# Patient Record
Sex: Female | Born: 1960 | Race: Black or African American | Hispanic: No | Marital: Single | Smoking: Current every day smoker
Health system: Southern US, Community
[De-identification: ages and names within clinical notes are randomized; demographics above are authoritative.]

## PROBLEM LIST (undated history)

## (undated) DIAGNOSIS — E119 Type 2 diabetes mellitus without complications: Secondary | ICD-10-CM

## (undated) DIAGNOSIS — K759 Inflammatory liver disease, unspecified: Secondary | ICD-10-CM

## (undated) DIAGNOSIS — Z9289 Personal history of other medical treatment: Secondary | ICD-10-CM

## (undated) DIAGNOSIS — F329 Major depressive disorder, single episode, unspecified: Secondary | ICD-10-CM

## (undated) DIAGNOSIS — F1010 Alcohol abuse, uncomplicated: Secondary | ICD-10-CM

## (undated) DIAGNOSIS — I509 Heart failure, unspecified: Secondary | ICD-10-CM

## (undated) DIAGNOSIS — I639 Cerebral infarction, unspecified: Secondary | ICD-10-CM

## (undated) DIAGNOSIS — F419 Anxiety disorder, unspecified: Secondary | ICD-10-CM

## (undated) DIAGNOSIS — G35 Multiple sclerosis: Secondary | ICD-10-CM

## (undated) DIAGNOSIS — Z8742 Personal history of other diseases of the female genital tract: Secondary | ICD-10-CM

## (undated) DIAGNOSIS — A630 Anogenital (venereal) warts: Secondary | ICD-10-CM

## (undated) DIAGNOSIS — F319 Bipolar disorder, unspecified: Secondary | ICD-10-CM

## (undated) DIAGNOSIS — F0150 Vascular dementia without behavioral disturbance: Secondary | ICD-10-CM

## (undated) DIAGNOSIS — I10 Essential (primary) hypertension: Secondary | ICD-10-CM

## (undated) DIAGNOSIS — E876 Hypokalemia: Secondary | ICD-10-CM

## (undated) DIAGNOSIS — K635 Polyp of colon: Secondary | ICD-10-CM

## (undated) DIAGNOSIS — M6282 Rhabdomyolysis: Secondary | ICD-10-CM

## (undated) HISTORY — DX: Alcohol abuse, uncomplicated: F10.10

## (undated) HISTORY — DX: Polyp of colon: K63.5

## (undated) HISTORY — DX: Inflammatory liver disease, unspecified: K75.9

## (undated) HISTORY — DX: Anogenital (venereal) warts: A63.0

## (undated) HISTORY — DX: Personal history of other diseases of the female genital tract: Z87.42

## (undated) HISTORY — PX: OTHER SURGICAL HISTORY: SHX169

---

## 1995-06-06 DIAGNOSIS — Z8742 Personal history of other diseases of the female genital tract: Secondary | ICD-10-CM

## 1995-06-06 DIAGNOSIS — K635 Polyp of colon: Secondary | ICD-10-CM

## 1995-06-06 HISTORY — DX: Personal history of other diseases of the female genital tract: Z87.42

## 1995-06-06 HISTORY — DX: Polyp of colon: K63.5

## 1997-11-25 ENCOUNTER — Encounter: Admission: RE | Admit: 1997-11-25 | Discharge: 1997-11-25 | Payer: Self-pay | Admitting: Family Medicine

## 1997-12-04 ENCOUNTER — Encounter: Admission: RE | Admit: 1997-12-04 | Discharge: 1997-12-04 | Payer: Self-pay | Admitting: Family Medicine

## 1997-12-14 ENCOUNTER — Encounter: Admission: RE | Admit: 1997-12-14 | Discharge: 1997-12-14 | Payer: Self-pay | Admitting: Family Medicine

## 1998-11-26 ENCOUNTER — Encounter: Admission: RE | Admit: 1998-11-26 | Discharge: 1998-11-26 | Payer: Self-pay | Admitting: Family Medicine

## 1998-12-08 ENCOUNTER — Ambulatory Visit (HOSPITAL_COMMUNITY): Admission: RE | Admit: 1998-12-08 | Discharge: 1998-12-08 | Payer: Self-pay

## 2001-02-03 ENCOUNTER — Encounter (INDEPENDENT_AMBULATORY_CARE_PROVIDER_SITE_OTHER): Payer: Self-pay | Admitting: *Deleted

## 2001-02-03 LAB — CONVERTED CEMR LAB

## 2001-02-26 ENCOUNTER — Encounter: Admission: RE | Admit: 2001-02-26 | Discharge: 2001-02-26 | Payer: Self-pay | Admitting: Sports Medicine

## 2001-02-26 ENCOUNTER — Other Ambulatory Visit: Admission: RE | Admit: 2001-02-26 | Discharge: 2001-02-26 | Payer: Self-pay | Admitting: Sports Medicine

## 2001-04-25 ENCOUNTER — Ambulatory Visit (HOSPITAL_COMMUNITY): Admission: RE | Admit: 2001-04-25 | Discharge: 2001-04-25 | Payer: Self-pay | Admitting: Internal Medicine

## 2001-04-25 ENCOUNTER — Encounter: Payer: Self-pay | Admitting: Internal Medicine

## 2001-05-09 ENCOUNTER — Emergency Department (HOSPITAL_COMMUNITY): Admission: EM | Admit: 2001-05-09 | Discharge: 2001-05-09 | Payer: Self-pay | Admitting: Emergency Medicine

## 2001-05-09 ENCOUNTER — Encounter: Payer: Self-pay | Admitting: Emergency Medicine

## 2001-05-13 ENCOUNTER — Ambulatory Visit (HOSPITAL_COMMUNITY): Admission: RE | Admit: 2001-05-13 | Discharge: 2001-05-13 | Payer: Self-pay | Admitting: Neurology

## 2001-05-13 ENCOUNTER — Encounter: Payer: Self-pay | Admitting: Neurology

## 2001-06-25 ENCOUNTER — Ambulatory Visit (HOSPITAL_COMMUNITY): Admission: RE | Admit: 2001-06-25 | Discharge: 2001-06-25 | Payer: Self-pay | Admitting: Neurology

## 2002-03-10 ENCOUNTER — Encounter: Payer: Self-pay | Admitting: Emergency Medicine

## 2002-03-10 ENCOUNTER — Emergency Department (HOSPITAL_COMMUNITY): Admission: EM | Admit: 2002-03-10 | Discharge: 2002-03-10 | Payer: Self-pay | Admitting: Emergency Medicine

## 2002-07-07 ENCOUNTER — Encounter: Payer: Self-pay | Admitting: Internal Medicine

## 2002-07-07 ENCOUNTER — Ambulatory Visit (HOSPITAL_COMMUNITY): Admission: RE | Admit: 2002-07-07 | Discharge: 2002-07-07 | Payer: Self-pay | Admitting: Internal Medicine

## 2002-12-05 ENCOUNTER — Encounter (HOSPITAL_COMMUNITY): Admission: RE | Admit: 2002-12-05 | Discharge: 2003-01-04 | Payer: Self-pay | Admitting: Neurology

## 2002-12-06 ENCOUNTER — Inpatient Hospital Stay (HOSPITAL_COMMUNITY): Admission: EM | Admit: 2002-12-06 | Discharge: 2002-12-10 | Payer: Self-pay | Admitting: Emergency Medicine

## 2002-12-09 ENCOUNTER — Encounter: Payer: Self-pay | Admitting: Neurology

## 2003-01-05 ENCOUNTER — Encounter (HOSPITAL_COMMUNITY): Admission: RE | Admit: 2003-01-05 | Discharge: 2003-02-05 | Payer: Self-pay | Admitting: Internal Medicine

## 2003-01-21 ENCOUNTER — Encounter: Payer: Self-pay | Admitting: Neurology

## 2003-01-21 ENCOUNTER — Ambulatory Visit (HOSPITAL_COMMUNITY): Admission: RE | Admit: 2003-01-21 | Discharge: 2003-01-21 | Payer: Self-pay | Admitting: Neurology

## 2003-07-29 ENCOUNTER — Ambulatory Visit (HOSPITAL_COMMUNITY): Admission: RE | Admit: 2003-07-29 | Discharge: 2003-07-29 | Payer: Self-pay | Admitting: Internal Medicine

## 2004-07-12 ENCOUNTER — Ambulatory Visit (HOSPITAL_COMMUNITY): Admission: RE | Admit: 2004-07-12 | Discharge: 2004-07-12 | Payer: Self-pay | Admitting: Neurology

## 2004-07-29 ENCOUNTER — Ambulatory Visit (HOSPITAL_COMMUNITY): Admission: RE | Admit: 2004-07-29 | Discharge: 2004-07-29 | Payer: Self-pay | Admitting: Internal Medicine

## 2004-10-05 ENCOUNTER — Ambulatory Visit (HOSPITAL_COMMUNITY): Payer: Self-pay | Admitting: Neurology

## 2004-10-05 ENCOUNTER — Encounter (HOSPITAL_COMMUNITY): Admission: RE | Admit: 2004-10-05 | Discharge: 2004-11-04 | Payer: Self-pay | Admitting: Neurology

## 2004-12-17 ENCOUNTER — Emergency Department (HOSPITAL_COMMUNITY): Admission: EM | Admit: 2004-12-17 | Discharge: 2004-12-17 | Payer: Self-pay | Admitting: *Deleted

## 2004-12-22 ENCOUNTER — Ambulatory Visit: Payer: Self-pay | Admitting: Orthopedic Surgery

## 2005-01-05 ENCOUNTER — Emergency Department (HOSPITAL_COMMUNITY): Admission: EM | Admit: 2005-01-05 | Discharge: 2005-01-05 | Payer: Self-pay | Admitting: Emergency Medicine

## 2005-01-05 ENCOUNTER — Ambulatory Visit (HOSPITAL_COMMUNITY): Payer: Self-pay | Admitting: Neurology

## 2005-01-05 ENCOUNTER — Encounter (HOSPITAL_COMMUNITY): Admission: RE | Admit: 2005-01-05 | Discharge: 2005-02-04 | Payer: Self-pay | Admitting: Neurology

## 2005-03-16 ENCOUNTER — Encounter (HOSPITAL_COMMUNITY): Admission: RE | Admit: 2005-03-16 | Discharge: 2005-04-15 | Payer: Self-pay | Admitting: Neurology

## 2005-03-16 ENCOUNTER — Ambulatory Visit (HOSPITAL_COMMUNITY): Payer: Self-pay | Admitting: Neurology

## 2005-05-02 ENCOUNTER — Ambulatory Visit (HOSPITAL_COMMUNITY): Admission: RE | Admit: 2005-05-02 | Discharge: 2005-05-02 | Payer: Self-pay | Admitting: Internal Medicine

## 2005-05-04 ENCOUNTER — Emergency Department (HOSPITAL_COMMUNITY): Admission: EM | Admit: 2005-05-04 | Discharge: 2005-05-04 | Payer: Self-pay | Admitting: Emergency Medicine

## 2005-06-28 ENCOUNTER — Emergency Department (HOSPITAL_COMMUNITY): Admission: EM | Admit: 2005-06-28 | Discharge: 2005-06-28 | Payer: Self-pay | Admitting: Emergency Medicine

## 2005-12-07 ENCOUNTER — Ambulatory Visit (HOSPITAL_COMMUNITY): Admission: RE | Admit: 2005-12-07 | Discharge: 2005-12-07 | Payer: Self-pay | Admitting: Internal Medicine

## 2006-05-18 ENCOUNTER — Encounter: Payer: Self-pay | Admitting: Emergency Medicine

## 2006-05-18 ENCOUNTER — Inpatient Hospital Stay (HOSPITAL_COMMUNITY): Admission: AD | Admit: 2006-05-18 | Discharge: 2006-05-28 | Payer: Self-pay | Admitting: Neurosurgery

## 2006-05-22 ENCOUNTER — Ambulatory Visit: Payer: Self-pay | Admitting: Physical Medicine & Rehabilitation

## 2006-07-03 ENCOUNTER — Encounter: Admission: RE | Admit: 2006-07-03 | Discharge: 2006-07-03 | Payer: Self-pay | Admitting: Neurosurgery

## 2006-07-31 ENCOUNTER — Encounter: Admission: RE | Admit: 2006-07-31 | Discharge: 2006-07-31 | Payer: Self-pay | Admitting: Neurosurgery

## 2006-08-03 ENCOUNTER — Encounter (INDEPENDENT_AMBULATORY_CARE_PROVIDER_SITE_OTHER): Payer: Self-pay | Admitting: *Deleted

## 2006-08-23 ENCOUNTER — Encounter: Admission: RE | Admit: 2006-08-23 | Discharge: 2006-08-23 | Payer: Self-pay | Admitting: Neurosurgery

## 2006-09-13 ENCOUNTER — Encounter: Admission: RE | Admit: 2006-09-13 | Discharge: 2006-09-13 | Payer: Self-pay | Admitting: Neurosurgery

## 2006-10-10 ENCOUNTER — Inpatient Hospital Stay (HOSPITAL_COMMUNITY): Admission: RE | Admit: 2006-10-10 | Discharge: 2006-10-15 | Payer: Self-pay | Admitting: Neurosurgery

## 2006-10-16 ENCOUNTER — Emergency Department (HOSPITAL_COMMUNITY): Admission: EM | Admit: 2006-10-16 | Discharge: 2006-10-16 | Payer: Self-pay | Admitting: Emergency Medicine

## 2006-10-16 ENCOUNTER — Emergency Department (HOSPITAL_COMMUNITY): Admission: EM | Admit: 2006-10-16 | Discharge: 2006-10-17 | Payer: Self-pay | Admitting: Emergency Medicine

## 2006-10-24 ENCOUNTER — Emergency Department (HOSPITAL_COMMUNITY): Admission: EM | Admit: 2006-10-24 | Discharge: 2006-10-24 | Payer: Self-pay | Admitting: Emergency Medicine

## 2006-11-27 ENCOUNTER — Encounter: Admission: RE | Admit: 2006-11-27 | Discharge: 2006-11-27 | Payer: Self-pay | Admitting: Neurosurgery

## 2007-01-01 ENCOUNTER — Encounter: Admission: RE | Admit: 2007-01-01 | Discharge: 2007-01-01 | Payer: Self-pay | Admitting: Neurosurgery

## 2007-01-08 ENCOUNTER — Ambulatory Visit (HOSPITAL_COMMUNITY): Admission: RE | Admit: 2007-01-08 | Discharge: 2007-01-08 | Payer: Self-pay | Admitting: Obstetrics and Gynecology

## 2008-01-24 ENCOUNTER — Emergency Department (HOSPITAL_COMMUNITY): Admission: EM | Admit: 2008-01-24 | Discharge: 2008-01-24 | Payer: Self-pay | Admitting: Emergency Medicine

## 2008-03-01 ENCOUNTER — Emergency Department (HOSPITAL_COMMUNITY): Admission: EM | Admit: 2008-03-01 | Discharge: 2008-03-01 | Payer: Self-pay | Admitting: Emergency Medicine

## 2009-05-11 ENCOUNTER — Ambulatory Visit (HOSPITAL_COMMUNITY): Admission: RE | Admit: 2009-05-11 | Discharge: 2009-05-11 | Payer: Self-pay | Admitting: Internal Medicine

## 2009-06-02 ENCOUNTER — Other Ambulatory Visit: Admission: RE | Admit: 2009-06-02 | Discharge: 2009-06-02 | Payer: Self-pay | Admitting: Obstetrics and Gynecology

## 2009-10-01 ENCOUNTER — Emergency Department (HOSPITAL_COMMUNITY): Admission: EM | Admit: 2009-10-01 | Discharge: 2009-10-01 | Payer: Self-pay | Admitting: Emergency Medicine

## 2010-08-16 ENCOUNTER — Emergency Department (HOSPITAL_COMMUNITY): Payer: Medicare Other

## 2010-08-16 ENCOUNTER — Emergency Department (HOSPITAL_COMMUNITY)
Admission: EM | Admit: 2010-08-16 | Discharge: 2010-08-16 | Disposition: A | Discharge status: Home / Self Care | Payer: Medicare Other | Attending: Emergency Medicine | Admitting: Emergency Medicine

## 2010-08-16 DIAGNOSIS — M25569 Pain in unspecified knee: Secondary | ICD-10-CM | POA: Insufficient documentation

## 2010-08-16 DIAGNOSIS — M1990 Unspecified osteoarthritis, unspecified site: Secondary | ICD-10-CM | POA: Insufficient documentation

## 2010-08-16 DIAGNOSIS — G35 Multiple sclerosis: Secondary | ICD-10-CM | POA: Insufficient documentation

## 2010-08-16 DIAGNOSIS — E7800 Pure hypercholesterolemia, unspecified: Secondary | ICD-10-CM | POA: Insufficient documentation

## 2010-08-23 LAB — DIFFERENTIAL
Eosinophils Absolute: 0.2 10*3/uL (ref 0.0–0.7)
Eosinophils Relative: 2 % (ref 0–5)
Lymphs Abs: 3.5 10*3/uL (ref 0.7–4.0)
Monocytes Relative: 8 % (ref 3–12)

## 2010-08-23 LAB — CBC
HCT: 37.8 % (ref 36.0–46.0)
MCV: 83.3 fL (ref 78.0–100.0)
RBC: 4.55 MIL/uL (ref 3.87–5.11)
WBC: 6.9 10*3/uL (ref 4.0–10.5)

## 2010-08-23 LAB — BASIC METABOLIC PANEL
Chloride: 104 mEq/L (ref 96–112)
GFR calc Af Amer: >60 mL/min (ref >60)
Potassium: 3.4 mEq/L — ABNORMAL LOW (ref 3.5–5.1)

## 2010-08-23 LAB — POCT CARDIAC MARKERS: Myoglobin, poc: 47.7 ng/mL (ref 12–200)

## 2010-09-19 ENCOUNTER — Encounter: Payer: Self-pay | Admitting: Orthopedic Surgery

## 2010-09-23 ENCOUNTER — Encounter: Payer: Self-pay | Admitting: Orthopedic Surgery

## 2010-09-23 ENCOUNTER — Encounter: Payer: Self-pay | Admitting: Family Medicine

## 2010-10-05 ENCOUNTER — Encounter: Payer: Self-pay | Admitting: Orthopedic Surgery

## 2010-10-05 ENCOUNTER — Ambulatory Visit: Payer: Medicare Other | Admitting: Orthopedic Surgery

## 2010-10-18 NOTE — Op Note (Signed)
Tami Nolan, Tami Nolan NO.:  [id number]   MEDICAL RECORD NO.:  [id number]          PATIENT TYPE:  INP   LOCATION:  3015                         FACILITY:  [facility]   PHYSICIAN:  Donalee Citrin, M.D.        DATE OF BIRTH:  02/29/1961   DATE OF PROCEDURE:  10/10/2006  DATE OF DISCHARGE:                               OPERATIVE REPORT   PREOPERATIVE DIAGNOSIS:  Failure of fusion and hardware of an L2 burst  fracture, traumatic.   POSTOPERATIVE DIAGNOSIS:  Failure of fusion and hardware of an L2 burst  fracture, traumatic.   PROCEDURE:  Revision of fusion of L2 burst fracture, removal and  repositioning of L1 pedicle screws, extension up to T11 with placement  of thoracic pedicle screws at T11-T12, retaining the L2 pedicle screw,  placement of bilateral L3 pedicle screws, for a segmental fixation from  T11 to L3, reduction of spinal deformity T11 to L3, posterolateral  arthrodesis T11 to L3 using Mastergraft Matrix graft extender, BMP  recombinant bone morphogenic protein, and Actifuse.   SURGEON:  Donalee Citrin, M.D.   ASSISTANT:  Tia Alert, M.D.   ANESTHESIA:  General endotracheal anesthesia.   HISTORY OF PRESENT ILLNESS:  The patient is a 50 year old female who  presented almost two months ago with an L2 burst fracture.  Postoperatively, the patient was noncompliant in her brace and over the  last several weeks, has had progressive kyphosis with displacement of  the L1 pedicle screws into the T12-L1 disc space and failure of fusion.  The patient, due to displacement of the screws and progressive kyphosis,  was recommended revision of hardware.  The risks and benefits were  explained to the patient who understood and agreed to proceed.   DESCRIPTION OF PROCEDURE:  The patient was brought to the OR and was  induced under general anesthesia, positioned prone on the Wilson frame,  the back was prepped and draped in the usual sterile fashion.  Her old  incision was  opened up and extended cephalocaudal, the scar tissue was  dissected free and subperiosteal dissection was carried out over the  lamina of T10, T11, T12, L2, and L3. After exposure of the TPs  bilaterally at all these levels,  attention was first taken to removal  of the rods and screws at L1. So, the top end was tightened down of the  screws at L1 and L3, the cross-link was removed, the rods were removed,  the L1 pedicle screws were removed, the L3 screws were noted to be in  good position and still solid.  So, first, L3 screws were placed using  fluoroscopy and external bony landmarks.  A pilot hole was drilled in  the inferior aspect of the facet complex at the level of the PTP  confirmed with fluoroscopy, cannulated with the awl, probed from within  the pedicle, tapped with a 5.5 tap, probed from within the pedicle, a 6  by 45 screw inserted at L3 on the left.  This procedure was repeated  with a 6 by 45 screw inserted at L3 on  the right. After the lumbar  screws had been inserted, attention was taken to placing the thoracic  screws.  First, with lateral fluoroscopy, pilot holes were drilled with  a high speed drill at T11 and T12 bilaterally.  Then, with changing the  fluoro to AP, the position within the lateral border of the pedicle was  confirmed, and then went back to the lateral and the T11 and T12 screws  were placed on the right side, subsequently on the left side.  All  screws had excellent purchase.  They were probed at each step along the  way, both from within the pedicle and then fluoroscopy confirming depth  and trajectory.  5.5 by 40 screws were inserted at T11 and T12. The L1  screw holes were inspected.  It was felt there was enough of the L1  vertebral body competency left with redirection of the screws to place  screws and it was felt that another point of fixation would benefit the  patient.  So, using a slightly superior entry point in the L1 pedicle  screw with a  more inferior trajectory, new holes were drilled, tapped,  probed, and 6.5 by 45 screws inserted at L1. All screws had excellent  purchase.  Fluoroscopy confirmed good position.  The wound was copiously  irrigated and meticulous hemostasis was maintained.  Aggressive  decortication was carried in the TPs and lateral gutters from T11 down  to L3. The Mastergraft matrix graft extender mixed with BMP and Actifuse  and the patient's decorticated lateral masses was then laid down. After  all the graft was laid down, rods were cut, fashioned and placed.  All  screws were tightened down in situ and counter torqued.  Then, a cross-  link was inserted over the previous cross-link site.  Then two medium  Hemovac drains were placed.  Postop fluoroscopy confirmed good position  of the screws and rods.  Then, the wound was closed in layers with  interrupted Vicryl and the skin was closed with running 4-0  subcuticular.  The patient went to the recovery room in stable  condition.  At the end of the case, counts were correct.           ______________________________  Donalee Citrin, M.D.     GC/MEDQ  D:  10/10/2006  T:  10/10/2006  Job:  [id number]

## 2010-10-18 NOTE — Discharge Summary (Signed)
Tami Nolan, Tami Nolan                ACCOUNT NO.:  [id number]   MEDICAL RECORD NO.:  [id number]          PATIENT TYPE:  INP   LOCATION:  3015                         FACILITY:  [facility]   PHYSICIAN:  Donalee Citrin, M.D.        DATE OF BIRTH:  Oct 30, 1960   DATE OF ADMISSION:  10/10/2006  DATE OF DISCHARGE:  10/15/2006                               DISCHARGE SUMMARY   ADMISSION DIAGNOSIS:  Thoracic burst fracture failure of fusion from L1  to L3.   PROCEDURE DURING HOSPITALIZATION:  Revision of fusion from T11 to L4.   HOSPITAL COURSE:  The patient is a very pleasant, 50 year old female who  was admitted and went to the operating room where I performed the above  procedure.  Postoperatively, the patient did very well.  She went to the  recovery room and then the floor.  On the floor, the patient was having  a lot of postoperative back pain but no new leg symptoms.  Her lumbar  brace was obtained and she was progressively mobilized with physical and  occupational therapy on the first and second postoperative days.   Over the next couple of days, her drain was able to be taken out.  She  was mobilized very well.  She was fairly noncompliant with the brace and  the use of this was stressed with her on multiple occasions.  However,  by hospital day 5 the patient was able to be discharged home.  She was  discharged with home health physical therapy and home health nursing,  and told to wear the brace at all times whenever she is out of bed.  Both physicians and nursing felt the patient to be competent to be  discharged home and take care of herself with the resources that had  been arranged.           ______________________________  Donalee Citrin, M.D.     GC/MEDQ  D:  11/19/2006  T:  11/19/2006  Job:  [id number]

## 2010-10-21 NOTE — Procedures (Signed)
[name], Tami Nolan                ACCOUNT NO.:  [id number]   MEDICAL RECORD NO.:  [id number]          PATIENT TYPE:  OUT   LOCATION:  RAD                           FACILITY:  [facility]   PHYSICIAN:  Richard A. Alanda Amass, M.D.DATE OF BIRTH:  1960/11/05   DATE OF PROCEDURE:  DATE OF DISCHARGE:                                  ECHOCARDIOGRAM   This 50 year old woman has a history of multiple sclerosis and possible  heart failure.  A 2D echo was done to evaluate LV function.   1.  The aorta is normal at 2.8-cm.  2.  The aortic valve has three leaflets and opens normally.  There is no      aortic stenosis and no significant AI.  There is mild aortic sclerosis.  3.  The left atrium is normal at 3.9-cm.  The patient was in a sinus rhythm      during the study and there were no clots seen.  4.  IVS and LVPW was slightly was slightly asymmetrically thickened to 1.4      and 1.2-cm respectively.  There is normal contraction pattern of the IVS      and LVPW.  There is no outflow track gradient present.  5.  Left ventricular internal dimensions are W1L.  LVIDD equal 4.0-cm.      LVISD equal 2.5-cm.  There is normal thickening of all visualized      segments and no segmental wall motion abnormalities.  Estimated EF is      approximately 60%.  Doppler LV inflow signal is normal with no evidence      of diastolic relaxation abnormality or diastolic dysfunction.  6.  Mitral valve is mildly thickened.  There is no mitral valve prolapse.      There is trace to mild mitral regurgitation present.  There is minimal      mitral anular calcification.  7.  There is trace tricuspid regurgitation.  8.  The right ventricle is normal.  9.  There is no pericardial effusion.   A 2D echo shows:  1.  Normal systolic function.  2.  No evidence of diastolic relaxation abnormality of the left ventricle.  3.  There are normal internal chamber dimensions and no significant valvular      disease present.  4.  There is  very mild thickening of the mitral valve with no mitral valve      prolapse present.      RAW/MEDQ  D:  07/12/2004  T:  07/12/2004  Job:  [id number]   cc:   Darleen Crocker A. Gerilyn Pilgrim, M.D.  [address], [place]  [place]  [place] [address]  Fax: [phone]
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
## 2010-10-21 NOTE — Consult Note (Signed)
[name], [name] NO.:  [id number]   MEDICAL RECORD NO.:  [id number]          PATIENT TYPE:  INP   LOCATION:  3008                         FACILITY:  [facility]   PHYSICIAN:  Antonietta Breach, M.D.  DATE OF BIRTH:  1961/05/02   DATE OF CONSULTATION:  DATE OF DISCHARGE:  05/28/2006                                 CONSULTATION   DATE OF FOLLOWUP:  May 26, 2006   SUBJECTIVE:  Tami Nolan continues to have normal thought process.  She  has not had any hallucinations or delusions.  Her orientation has  remained intact and she is not had any combativeness.  She has no  psychotropic adverse effects.  Her appetite is intact and her interest  in TV and music are intact.  She has hope for the future.   EXAMINATION:  VITAL SIGNS:  Temperature 97.1, pulse 115, respiration 20,  blood pressure 113/78, O2 saturation on room air is 97%.   MENTAL STATUS EXAM:  [name] is alert.  She is oriented to all  spheres.  Her thought process is logical, coherent and goal-directed.  No looseness of associations.  Thought content:  No thoughts of harming  herself, no thoughts of harming others.  No delusions.  No  hallucinations.  Affect:  Broad and appropriate.  Concentration within  normal limits.  Judgment:  Intact.   ASSESSMENT:  1. 293.83 mood disorder not otherwise specified, stable.  2. 293.00 delirium, not otherwise specified, now in remission.  The      patient had a brief period of delirium.   RECOMMENDATIONS:  1. The patient is psychiatrically cleared for discharge. Would ask the      case manager to set the patient with her outpatient psychiatrist      within the first 10 days of discharge.  If the patient does not      have a outpatient psychiatrist, options include the clinic of [facility], also the clinics of [place] or       [facility].  Another option is the [facility].  2. 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  Gabapentin 1200 mg t.i.d. is mainly for neurologic      indications, but probably does have some mood stabilizing and      antianxiety benefits.  3. 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   [name] is motivated to continue her psychotropic regimen.  She agrees  to call the emergency services for any thoughts of harming herself,  thoughts of harming others or other psychiatric emergency symptoms.      Antonietta Breach, M.D.  Electronically Signed     JW/MEDQ  D:  05/27/2006  T:  05/28/2006  Job:  [id number]

## 2011-02-15 ENCOUNTER — Emergency Department (HOSPITAL_COMMUNITY): Payer: Medicare Other

## 2011-02-15 ENCOUNTER — Emergency Department (HOSPITAL_COMMUNITY)
Admission: EM | Admit: 2011-02-15 | Discharge: 2011-02-15 | Disposition: A | Discharge status: Home / Self Care | Payer: Medicare Other | Attending: Emergency Medicine | Admitting: Emergency Medicine

## 2011-02-15 ENCOUNTER — Encounter (HOSPITAL_COMMUNITY): Payer: Self-pay | Admitting: *Deleted

## 2011-02-15 DIAGNOSIS — Z860100 Personal history of colon polyps, unspecified: Secondary | ICD-10-CM | POA: Insufficient documentation

## 2011-02-15 DIAGNOSIS — F1010 Alcohol abuse, uncomplicated: Secondary | ICD-10-CM | POA: Insufficient documentation

## 2011-02-15 DIAGNOSIS — S40029A Contusion of unspecified upper arm, initial encounter: Secondary | ICD-10-CM

## 2011-02-15 DIAGNOSIS — Y92009 Unspecified place in unspecified non-institutional (private) residence as the place of occurrence of the external cause: Secondary | ICD-10-CM | POA: Insufficient documentation

## 2011-02-15 DIAGNOSIS — K759 Inflammatory liver disease, unspecified: Secondary | ICD-10-CM | POA: Insufficient documentation

## 2011-02-15 DIAGNOSIS — Z9851 Tubal ligation status: Secondary | ICD-10-CM | POA: Insufficient documentation

## 2011-02-15 DIAGNOSIS — M25519 Pain in unspecified shoulder: Secondary | ICD-10-CM | POA: Insufficient documentation

## 2011-02-15 DIAGNOSIS — F17200 Nicotine dependence, unspecified, uncomplicated: Secondary | ICD-10-CM | POA: Insufficient documentation

## 2011-02-15 DIAGNOSIS — G35 Multiple sclerosis: Secondary | ICD-10-CM | POA: Insufficient documentation

## 2011-02-15 DIAGNOSIS — IMO0002 Reserved for concepts with insufficient information to code with codable children: Secondary | ICD-10-CM | POA: Insufficient documentation

## 2011-02-15 HISTORY — DX: Multiple sclerosis: G35

## 2011-02-15 MED ORDER — ACETAMINOPHEN 500 MG PO TABS
1000.0000 mg | ORAL_TABLET | Freq: Once | ORAL | Status: AC
  Administered 2011-02-15: 1000 mg via ORAL
  Filled 2011-02-15: qty 2

## 2011-02-15 MED ORDER — TETANUS-DIPHTHERIA TOXOIDS TD 5-2 LFU IM INJ
0.5000 mL | INJECTION | Freq: Once | INTRAMUSCULAR | Status: AC
  Administered 2011-02-15: 0.5 mL via INTRAMUSCULAR
  Filled 2011-02-15: qty 0.5

## 2011-02-15 NOTE — ED Notes (Signed)
Pt c/o pain in her right arm. Pt states that she fell on Sunday and injured her arm. Also c/o pain in her right hand.
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
## 2011-04-04 ENCOUNTER — Other Ambulatory Visit: Payer: Self-pay

## 2011-04-04 ENCOUNTER — Emergency Department (HOSPITAL_COMMUNITY): Payer: Medicare Other

## 2011-04-04 ENCOUNTER — Emergency Department (HOSPITAL_COMMUNITY)
Admission: EM | Admit: 2011-04-04 | Discharge: 2011-04-05 | Disposition: A | Discharge status: Home / Self Care | Payer: Medicare Other | Attending: Emergency Medicine | Admitting: Emergency Medicine

## 2011-04-04 ENCOUNTER — Encounter (HOSPITAL_COMMUNITY): Payer: Self-pay | Admitting: *Deleted

## 2011-04-04 DIAGNOSIS — G35 Multiple sclerosis: Secondary | ICD-10-CM | POA: Insufficient documentation

## 2011-04-04 DIAGNOSIS — F17200 Nicotine dependence, unspecified, uncomplicated: Secondary | ICD-10-CM | POA: Insufficient documentation

## 2011-04-04 DIAGNOSIS — Z860100 Personal history of colon polyps, unspecified: Secondary | ICD-10-CM | POA: Insufficient documentation

## 2011-04-04 DIAGNOSIS — R079 Chest pain, unspecified: Secondary | ICD-10-CM | POA: Insufficient documentation

## 2011-04-04 DIAGNOSIS — K7010 Alcoholic hepatitis without ascites: Secondary | ICD-10-CM | POA: Insufficient documentation

## 2011-04-04 DIAGNOSIS — F1010 Alcohol abuse, uncomplicated: Secondary | ICD-10-CM | POA: Insufficient documentation

## 2011-04-04 LAB — CBC
HCT: 37.9 % (ref 36.0–46.0)
MCV: 79.6 fL (ref 78.0–100.0)
Platelets: 276 10*3/uL (ref 150–400)
RBC: 4.76 MIL/uL (ref 3.87–5.11)
WBC: 8.1 10*3/uL (ref 4.0–10.5)

## 2011-04-04 LAB — DIFFERENTIAL
Eosinophils Relative: 3 % (ref 0–5)
Lymphocytes Relative: 45 % (ref 12–46)
Lymphs Abs: 3.6 10*3/uL (ref 0.7–4.0)
Neutro Abs: 3.4 10*3/uL (ref 1.7–7.7)

## 2011-04-04 LAB — POCT I-STAT TROPONIN I: Troponin i, poc: 0.01 ng/mL (ref 0.00–0.08)

## 2011-04-04 NOTE — ED Notes (Signed)
Per EMS - pt from home - pt c/o chest discomfort, intermittent, pt having difficulty describing the pain. 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 Denies shortness of breath, n/v.

## 2011-04-05 ENCOUNTER — Encounter (HOSPITAL_COMMUNITY): Payer: Self-pay | Admitting: *Deleted

## 2011-04-05 LAB — CARDIAC PANEL(CRET KIN+CKTOT+MB+TROPI)
CK, MB: 6 ng/mL — ABNORMAL HIGH (ref 0.3–4.0)
Relative Index: 3.4 — ABNORMAL HIGH (ref 0.0–2.5)
Troponin I: <0.3 ng/mL (ref <0.30)

## 2011-04-05 LAB — BASIC METABOLIC PANEL
CO2: 27 mEq/L (ref 19–32)
Calcium: 9.1 mg/dL (ref 8.4–10.5)
Chloride: 101 mEq/L (ref 96–112)
Glucose, Bld: 175 mg/dL — ABNORMAL HIGH (ref 70–99)
Sodium: 137 mEq/L (ref 135–145)

## 2011-04-05 MED ORDER — POTASSIUM CHLORIDE CRYS ER 20 MEQ PO TBCR
20.0000 meq | EXTENDED_RELEASE_TABLET | Freq: Once | ORAL | Status: AC
  Administered 2011-04-05: 20 meq via ORAL
  Filled 2011-04-05: qty 1

## 2011-04-05 MED ORDER — ASPIRIN 81 MG PO CHEW
324.0000 mg | CHEWABLE_TABLET | Freq: Once | ORAL | Status: AC
  Administered 2011-04-05: 324 mg via ORAL

## 2011-04-05 MED ORDER — ASPIRIN 81 MG PO CHEW
CHEWABLE_TABLET | ORAL | Status: AC
  Filled 2011-04-05: qty 4

## 2011-04-05 NOTE — ED Provider Notes (Signed)
History     CSN: [id number] Arrival date & time: 04/04/2011 11:23 PM   First MD Initiated Contact with Patient 04/04/11 2326      Chief Complaint  Patient presents with  . Chest Pain    (Consider location/radiation/quality/duration/timing/severity/associated sxs/prior treatment) Patient is a 50 y.o. female presenting with chest pain. The history is provided by the patient.  Chest Pain The chest pain began 1 - 2 hours ago. Duration of episode(s) is 30 minutes. Chest pain occurs intermittently. The chest pain is resolved. Associated with: not associated with cough. At its most intense, the pain is at 5/10. The pain is currently at 0/10. The severity of the pain is moderate. The quality of the pain is described as aching. The pain radiates to the upper back. Chest pain is worsened by exertion. Pertinent negatives for primary symptoms include no fever, no fatigue, no syncope, no cough, no wheezing, no palpitations and no abdominal pain. She tried nothing for the symptoms. Risk factors include alcohol intake.  Her past medical history is significant for arrhythmia.  Pertinent negatives for past medical history include no aneurysm, no pacemaker, no seizures and no sickle cell disease.  Pertinent negatives for family medical history include: family history of aortic dissection.  Procedure history is negative for cardiac catheterization.     Past Medical History  Diagnosis Date  . Hepatitis     ETOH related   . ETOH abuse   . Hx of abnormal cervical Pap smear 1997  . Colon polyps 1997  . Genital warts   . Multiple sclerosis     Past Surgical History  Procedure Date  . Bilatetral tubal ligation     Family History  Problem Relation Age of Onset  . Coronary artery disease Brother   . Diabetes Brother   . Hypertension Brother   . Hypertension Mother   . Diabetes Mother   . Hypertension Sister   . Alcohol abuse      family history     History  Substance Use Topics  . Smoking  status: Current Everyday Smoker -- 1.0 packs/day for 15 years    Types: Cigarettes  . Smokeless tobacco: Never Used  . Alcohol Use: No     Hx of ETOH abuse - dry since 1997.    OB History    Grav Para Term Preterm Abortions TAB SAB Ect Mult Living                  Review of Systems  Constitutional: Negative for fever and fatigue.  HENT: Negative for congestion, sinus pressure and ear discharge.   Eyes: Negative for discharge.  Respiratory: Negative for cough and wheezing.   Cardiovascular: Positive for chest pain. Negative for palpitations and syncope.  Gastrointestinal: Negative for abdominal pain and diarrhea.  Genitourinary: Negative for frequency and hematuria.  Musculoskeletal: Negative for back pain.  Skin: Negative for rash.  Neurological: Negative for seizures and headaches.  Hematological: Negative.   Psychiatric/Behavioral: Negative for hallucinations.    Allergies  Review of patient's allergies indicates no known allergies.  Home Medications   Current Outpatient Rx  Name Route Sig Dispense Refill  . GABAPENTIN 600 MG PO TABS Oral Take 600 mg by mouth 3 (three) times daily.      . IBUPROFEN 800 MG PO TABS Oral Take 800 mg by mouth every 8 (eight) hours as needed. Pain     . METHOCARBAMOL 750 MG PO TABS Oral Take 750 mg by mouth 2 (two) times  daily.      Marland Kitchen MONTELUKAST SODIUM 10 MG PO TABS Oral Take 10 mg by mouth at bedtime.      Marland Kitchen PRENATAL PLUS 27-1 MG PO TABS Oral Take 1 tablet by mouth daily.      Marland Kitchen PRESCRIPTION MEDICATION Injection Inject as directed every other day. Patient states that she uses some type of injection for her MS every other day. Medication is prescribed by Dr. Felecia Shelling.     Marland Kitchen SIMVASTATIN 40 MG PO TABS Oral Take 40 mg by mouth at bedtime.      . TOLTERODINE TARTRATE 4 MG PO CP24 Oral Take 4 mg by mouth daily.      . TRAZODONE HCL 50 MG PO TABS Oral Take 50 mg by mouth at bedtime.      . VENLAFAXINE HCL 150 MG PO CP24 Oral Take 150 mg by mouth  daily.      . VENLAFAXINE HCL 75 MG PO CP24 Oral Take 75 mg by mouth daily.        BP 113/77  Pulse 77  Temp(Src) 98.2 F (36.8 C) (Oral)  Resp 16  Ht 5\' 7"  (1.702 m)  SpO2 98%  Physical Exam  Constitutional: She is oriented to person, place, and time. She appears well-developed.  HENT:  Head: Normocephalic and atraumatic.  Eyes: Conjunctivae and EOM are normal. No scleral icterus.  Neck: Neck supple. No thyromegaly present.  Cardiovascular: Normal rate and regular rhythm.  Exam reveals no gallop and no friction rub.   No murmur heard. Pulmonary/Chest: No stridor. She has no wheezes. She has no rales. She exhibits no tenderness.  Abdominal: She exhibits no distension. There is no tenderness. There is no rebound.  Musculoskeletal: Normal range of motion. She exhibits no edema.  Lymphadenopathy:    She has no cervical adenopathy.  Neurological: She is oriented to person, place, and time. Coordination normal.  Skin: No rash noted. No erythema.  Psychiatric: She has a normal mood and affect. Her behavior is normal.    ED Course  Procedures (including critical care time)  Labs Reviewed  CBC - Abnormal; Notable for the following:    MCHC 36.1 (*)    All other components within normal limits  DIFFERENTIAL - Abnormal; Notable for the following:    Neutrophils Relative 42 (*)    All other components within normal limits  BASIC METABOLIC PANEL - Abnormal; Notable for the following:    Potassium 2.9 (*)    Glucose, Bld 175 (*)    Creatinine, Ser 0.47 (*)    All other components within normal limits  CARDIAC PANEL(CRET KIN+CKTOT+MB+TROPI) - Abnormal; Notable for the following:    CK, MB 6.0 (*)    Relative Index 3.4 (*)    All other components within normal limits  POCT I-STAT TROPONIN I  POCT I-STAT TROPONIN I  I-STAT TROPONIN I  I-STAT TROPONIN I   Dg Chest 2 View  04/04/2011  *RADIOLOGY REPORT*  Clinical Data: Chest pain  CHEST - 2 VIEW  Comparison: 10/16/2006   Findings: Negative for heart failure.  Lungs are clear without infiltrate or effusion.  No mass lesion.  Thoraco lumbar fusion for a chronic fracture of L2.  IMPRESSION: No acute cardiopulmonary disease.  Original Report Authenticated By: Camelia Phenes, M.D.     1. Chest pain     Results for orders placed during the hospital encounter of 04/04/11  CBC      Component Value Range   WBC 8.1  4.0 - 10.5 (K/uL)   RBC 4.76  3.87 - 5.11 (MIL/uL)   Hemoglobin 13.7  12.0 - 15.0 (g/dL)   HCT 16.1  09.6 - 04.5 (%)   MCV 79.6  78.0 - 100.0 (fL)   MCH 28.8  26.0 - 34.0 (pg)   MCHC 36.1 (*) 30.0 - 36.0 (g/dL)   RDW 40.9  81.1 - 91.4 (%)   Platelets 276  150 - 400 (K/uL)  DIFFERENTIAL      Component Value Range   Neutrophils Relative 42 (*) 43 - 77 (%)   Neutro Abs 3.4  1.7 - 7.7 (K/uL)   Lymphocytes Relative 45  12 - 46 (%)   Lymphs Abs 3.6  0.7 - 4.0 (K/uL)   Monocytes Relative 10  3 - 12 (%)   Monocytes Absolute 0.8  0.1 - 1.0 (K/uL)   Eosinophils Relative 3  0 - 5 (%)   Eosinophils Absolute 0.2  0.0 - 0.7 (K/uL)   Basophils Relative 1  0 - 1 (%)   Basophils Absolute 0.1  0.0 - 0.1 (K/uL)  BASIC METABOLIC PANEL      Component Value Range   Sodium 137  135 - 145 (mEq/L)   Potassium 2.9 (*) 3.5 - 5.1 (mEq/L)   Chloride 101  96 - 112 (mEq/L)   CO2 27  19 - 32 (mEq/L)   Glucose, Bld 175 (*) 70 - 99 (mg/dL)   BUN 10  6 - 23 (mg/dL)   Creatinine, Ser 7.82 (*) 0.50 - 1.10 (mg/dL)   Calcium 9.1  8.4 - 95.6 (mg/dL)   GFR calc non Af Amer >90  >90 (mL/min)   GFR calc Af Amer >90  >90 (mL/min)  CARDIAC PANEL(CRET KIN+CKTOT+MB+TROPI)      Component Value Range   Total CK 176  7 - 177 (U/L)   CK, MB 6.0 (*) 0.3 - 4.0 (ng/mL)   Troponin I <0.30  <0.30 (ng/mL)   Relative Index 3.4 (*) 0.0 - 2.5   POCT I-STAT TROPONIN I      Component Value Range   Troponin i, poc 0.01  0.00 - 0.08 (ng/mL)   Comment 3           POCT I-STAT TROPONIN I      Component Value Range   Troponin i, poc 0.00  0.00  - 0.08 (ng/mL)   Comment 3            Dg Chest 2 View  04/04/2011  *RADIOLOGY REPORT*  Clinical Data: Chest pain  CHEST - 2 VIEW  Comparison: 10/16/2006  Findings: Negative for heart failure.  Lungs are clear without infiltrate or effusion.  No mass lesion.  Thoraco lumbar fusion for a chronic fracture of L2.  IMPRESSION: No acute cardiopulmonary disease.  Original Report Authenticated By: Camelia Phenes, M.D.   \   Date: 04/05/2011  Rate: 81  Rhythm: normal sinus rhythm  QRS Axis: normal  Intervals: normal  ST/T Wave abnormalities: nonspecific ST changes  Conduction Disutrbances:none  Narrative Interpretation:   Old EKG Reviewed: unchanged    MDM  Chest pain,  Anxiety,  Cardiac.  Pt was told she should be admitted to rule out an mi,  Or to do further test to check her heart out.  I told her she may need a stress test or catherization.   She decided to see her md this week for possible further tests.  She was put on aspirin once a day  Benny Lennert, MD 04/05/11 [phone]

## 2011-04-05 NOTE — ED Notes (Signed)
D/c instructions reviewed w/ pt - pt denies any further questions or concerns at present.   

## 2011-07-19 ENCOUNTER — Other Ambulatory Visit (HOSPITAL_COMMUNITY)
Admission: RE | Admit: 2011-07-19 | Discharge: 2011-07-19 | Disposition: A | Discharge status: Home / Self Care | Payer: Medicare Other | Source: Ambulatory Visit | Attending: Obstetrics and Gynecology | Admitting: Obstetrics and Gynecology

## 2011-07-19 ENCOUNTER — Other Ambulatory Visit: Payer: Self-pay | Admitting: Obstetrics and Gynecology

## 2011-07-19 DIAGNOSIS — Z1212 Encounter for screening for malignant neoplasm of rectum: Secondary | ICD-10-CM | POA: Diagnosis not present

## 2011-07-19 DIAGNOSIS — Z124 Encounter for screening for malignant neoplasm of cervix: Secondary | ICD-10-CM | POA: Diagnosis not present

## 2011-07-19 DIAGNOSIS — R319 Hematuria, unspecified: Secondary | ICD-10-CM | POA: Diagnosis not present

## 2011-10-17 DIAGNOSIS — G35 Multiple sclerosis: Secondary | ICD-10-CM | POA: Diagnosis not present

## 2011-10-17 DIAGNOSIS — M25519 Pain in unspecified shoulder: Secondary | ICD-10-CM | POA: Diagnosis not present

## 2011-11-06 ENCOUNTER — Other Ambulatory Visit: Payer: Self-pay | Admitting: Obstetrics and Gynecology

## 2011-11-14 DIAGNOSIS — M1990 Unspecified osteoarthritis, unspecified site: Secondary | ICD-10-CM | POA: Diagnosis not present

## 2011-11-14 DIAGNOSIS — G35 Multiple sclerosis: Secondary | ICD-10-CM | POA: Diagnosis not present

## 2011-11-21 DIAGNOSIS — Z0000 Encounter for general adult medical examination without abnormal findings: Secondary | ICD-10-CM | POA: Diagnosis not present

## 2011-11-21 DIAGNOSIS — I10 Essential (primary) hypertension: Secondary | ICD-10-CM | POA: Diagnosis not present

## 2011-11-21 DIAGNOSIS — E7800 Pure hypercholesterolemia, unspecified: Secondary | ICD-10-CM | POA: Diagnosis not present

## 2012-02-13 DIAGNOSIS — G35 Multiple sclerosis: Secondary | ICD-10-CM | POA: Diagnosis not present

## 2012-02-13 DIAGNOSIS — E7800 Pure hypercholesterolemia, unspecified: Secondary | ICD-10-CM | POA: Diagnosis not present

## 2012-02-13 DIAGNOSIS — Z23 Encounter for immunization: Secondary | ICD-10-CM | POA: Diagnosis not present

## 2012-02-26 ENCOUNTER — Emergency Department (HOSPITAL_COMMUNITY): Payer: Medicare Other

## 2012-02-26 ENCOUNTER — Emergency Department (HOSPITAL_COMMUNITY)
Admission: EM | Admit: 2012-02-26 | Discharge: 2012-02-26 | Disposition: A | Discharge status: Home / Self Care | Payer: Medicare Other | Attending: Emergency Medicine | Admitting: Emergency Medicine

## 2012-02-26 ENCOUNTER — Encounter (HOSPITAL_COMMUNITY): Payer: Self-pay | Admitting: *Deleted

## 2012-02-26 DIAGNOSIS — S4980XA Other specified injuries of shoulder and upper arm, unspecified arm, initial encounter: Secondary | ICD-10-CM | POA: Diagnosis not present

## 2012-02-26 DIAGNOSIS — Y9229 Other specified public building as the place of occurrence of the external cause: Secondary | ICD-10-CM | POA: Insufficient documentation

## 2012-02-26 DIAGNOSIS — S99929A Unspecified injury of unspecified foot, initial encounter: Secondary | ICD-10-CM | POA: Insufficient documentation

## 2012-02-26 DIAGNOSIS — S8990XA Unspecified injury of unspecified lower leg, initial encounter: Secondary | ICD-10-CM | POA: Diagnosis not present

## 2012-02-26 DIAGNOSIS — S40019A Contusion of unspecified shoulder, initial encounter: Secondary | ICD-10-CM

## 2012-02-26 DIAGNOSIS — S46909A Unspecified injury of unspecified muscle, fascia and tendon at shoulder and upper arm level, unspecified arm, initial encounter: Secondary | ICD-10-CM | POA: Insufficient documentation

## 2012-02-26 DIAGNOSIS — W19XXXA Unspecified fall, initial encounter: Secondary | ICD-10-CM | POA: Insufficient documentation

## 2012-02-26 DIAGNOSIS — S8390XA Sprain of unspecified site of unspecified knee, initial encounter: Secondary | ICD-10-CM

## 2012-02-26 DIAGNOSIS — IMO0002 Reserved for concepts with insufficient information to code with codable children: Secondary | ICD-10-CM | POA: Diagnosis not present

## 2012-02-26 DIAGNOSIS — G35 Multiple sclerosis: Secondary | ICD-10-CM | POA: Diagnosis not present

## 2012-02-26 DIAGNOSIS — F17200 Nicotine dependence, unspecified, uncomplicated: Secondary | ICD-10-CM | POA: Insufficient documentation

## 2012-02-26 DIAGNOSIS — S93409A Sprain of unspecified ligament of unspecified ankle, initial encounter: Secondary | ICD-10-CM

## 2012-02-26 DIAGNOSIS — Y998 Other external cause status: Secondary | ICD-10-CM | POA: Insufficient documentation

## 2012-02-26 DIAGNOSIS — Y9301 Activity, walking, marching and hiking: Secondary | ICD-10-CM | POA: Insufficient documentation

## 2012-02-26 DIAGNOSIS — S99919A Unspecified injury of unspecified ankle, initial encounter: Secondary | ICD-10-CM | POA: Diagnosis not present

## 2012-02-26 DIAGNOSIS — M25469 Effusion, unspecified knee: Secondary | ICD-10-CM | POA: Diagnosis not present

## 2012-02-26 MED ORDER — NAPROXEN 250 MG PO TABS
500.0000 mg | ORAL_TABLET | Freq: Once | ORAL | Status: AC
  Administered 2012-02-26: 500 mg via ORAL
  Filled 2012-02-26: qty 2

## 2012-02-26 MED ORDER — NAPROXEN 500 MG PO TABS: 500.0000 mg | ORAL_TABLET | Freq: Two times a day (BID) | ORAL | Status: DC

## 2012-02-26 NOTE — ED Notes (Signed)
Fell in [employer], on Friday,  Pain rt foot, ankle and shoulder, No LOC

## 2012-02-26 NOTE — ED Provider Notes (Signed)
History     CSN: [id number]  Arrival date & time 02/26/12  1535   First MD Initiated Contact with Patient 02/26/12 1625      Chief Complaint  Patient presents with  . Fall    (Consider location/radiation/quality/duration/timing/severity/associated sxs/prior treatment) HPI Comments: Patient c/o pain to her right ankle, knee and shoulder after falling in a grocery store.  Pain is worse with standing or walking.  Improves with rest.  She denies headaches, head injury,  dizziness, vomiting, visual changes, neck pain or LOC  Patient is a 51 y.o. female presenting with fall. The history is provided by the patient.  Fall The accident occurred more than 2 days ago. The fall occurred while walking. Distance fallen: from standing position. She landed on a hard floor. There was no blood loss. The point of impact was the right shoulder and right knee (right ankle). The pain is present in the right shoulder and right knee (right ankle). The patient is experiencing no pain. She was ambulatory at the scene. There was no entrapment after the fall. There was no drug use involved in the accident. There was no alcohol use involved in the accident. Pertinent negatives include no visual change, no fever, no numbness, no abdominal pain, no bowel incontinence, no nausea, no vomiting, no headaches, no loss of consciousness and no tingling. The symptoms are aggravated by standing and ambulation. She has tried nothing for the symptoms. The treatment provided no relief.    Past Medical History  Diagnosis Date  . Hepatitis     ETOH related   . ETOH abuse   . Hx of abnormal cervical Pap smear 1997  . Colon polyps 1997  . Genital warts   . Multiple sclerosis     Past Surgical History  Procedure Date  . Bilatetral tubal ligation     Family History  Problem Relation Age of Onset  . Coronary artery disease Brother   . Diabetes Brother   . Hypertension Brother   . Hypertension Mother   . Diabetes Mother     . Hypertension Sister   . Alcohol abuse      family history     History  Substance Use Topics  . Smoking status: Current Every Day Smoker -- 1.0 packs/day for 15 years    Types: Cigarettes  . Smokeless tobacco: Never Used  . Alcohol Use: No     Hx of ETOH abuse - dry since 1997.    OB History    Grav Para Term Preterm Abortions TAB SAB Ect Mult Living                  Review of Systems  Constitutional: Negative for fever and chills.  HENT: Negative for neck pain.   Eyes: Negative for visual disturbance.  Cardiovascular: Negative for chest pain.  Gastrointestinal: Negative for nausea, vomiting, abdominal pain and bowel incontinence.  Genitourinary: Negative for dysuria and difficulty urinating.  Musculoskeletal: Positive for joint swelling and arthralgias. Negative for back pain.  Skin: Negative for color change and wound.  Neurological: Negative for dizziness, tingling, loss of consciousness, syncope, weakness, numbness and headaches.  All other systems reviewed and are negative.    Allergies  Review of patient's allergies indicates no known allergies.  Home Medications   Current Outpatient Rx  Name Route Sig Dispense Refill  . GABAPENTIN 600 MG PO TABS Oral Take 600 mg by mouth 3 (three) times daily.      . IBUPROFEN 800 MG PO  TABS Oral Take 800 mg by mouth every 8 (eight) hours as needed. Pain     . METHOCARBAMOL 750 MG PO TABS Oral Take 750 mg by mouth 2 (two) times daily.      Marland Kitchen MONTELUKAST SODIUM 10 MG PO TABS Oral Take 10 mg by mouth at bedtime.      Marland Kitchen PRENATAL PLUS 27-1 MG PO TABS Oral Take 1 tablet by mouth daily.      Marland Kitchen PRESCRIPTION MEDICATION Injection Inject as directed every other day. Patient states that she uses some type of injection for her MS every other day. Medication is prescribed by Dr. Felecia Shelling.     Marland Kitchen SIMVASTATIN 40 MG PO TABS Oral Take 40 mg by mouth at bedtime.      . TOLTERODINE TARTRATE ER 4 MG PO CP24 Oral Take 4 mg by mouth daily.      .  TRAZODONE HCL 50 MG PO TABS Oral Take 50 mg by mouth at bedtime.      . VENLAFAXINE HCL ER 150 MG PO CP24 Oral Take 150 mg by mouth daily.      . VENLAFAXINE HCL ER 75 MG PO CP24 Oral Take 75 mg by mouth daily.        BP 131/89  Pulse 89  Temp 98.1 F (36.7 C) (Oral)  Resp 18  Ht 5\' 7"  (1.702 m)  Wt 161 lb (73.029 kg)  BMI 25.22 kg/m2  SpO2 98%  Physical Exam  Nursing note and vitals reviewed. Constitutional: She is oriented to person, place, and time. She appears well-developed and well-nourished. No distress.  Cardiovascular: Normal rate, regular rhythm, normal heart sounds and intact distal pulses.   Pulmonary/Chest: Effort normal and breath sounds normal.  Musculoskeletal: She exhibits tenderness. She exhibits no edema.       Right shoulder: She exhibits tenderness, bony tenderness and pain. She exhibits normal range of motion, no swelling, no effusion, no crepitus, no deformity, no laceration, no spasm, normal pulse and normal strength.       Right knee: She exhibits normal range of motion, no swelling, no effusion, no ecchymosis, no deformity, no laceration and no erythema. tenderness found. Medial joint line and lateral joint line tenderness noted.       Right ankle: She exhibits normal range of motion, no swelling, no ecchymosis, no deformity, no laceration and normal pulse. tenderness. Lateral malleolus tenderness found. No posterior TFL, no head of 5th metatarsal and no proximal fibula tenderness found. Achilles tendon normal.       Arms:      Legs:      Feet:       ttp of the anterior right knee, lateral right ankle and anterior right shoulder.  No erythema, bruising or deformity.  Radial pulse and DP pulse are brisk, distal sensation to the fingers and foot are intact, CR< 3 sec.    Neurological: She is alert and oriented to person, place, and time. She exhibits normal muscle tone. Coordination normal.  Skin: Skin is warm and dry. No erythema.    ED Course  Procedures  (including critical care time)  Labs Reviewed - No data to display Dg Shoulder Right  02/26/2012  *RADIOLOGY REPORT*  Clinical Data: Larey Seat and injured right shoulder.  RIGHT SHOULDER - 2+ VIEW  Comparison: Right shoulder x-rays 02/15/2011.  Findings: No evidence of acute fracture or glenohumeral dislocation.  Acromioclavicular joint intact without significant degenerative changes.  Slight narrowing of the subacromial space, more so than on the  prior examination.  IMPRESSION: No acute osseous abnormality.  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   Original Report Authenticated By: Arnell Sieving, M.D.    Dg Ankle Complete Right  02/26/2012  *RADIOLOGY REPORT*  Clinical Data: Larey Seat and injured right ankle.  RIGHT ANKLE - COMPLETE 3+ VIEW  Comparison: None.  Findings: Cross-table lateral technique was utilized. No evidence of acute, subacute, or healed fractures.  Ankle mortise intact with well-preserved joint space.  No intrinsic osseous abnormalities. No evidence of a significant joint effusion.  Phleboliths in the subcutaneous tissues of the lower leg.  IMPRESSION: No osseous abnormality.   Original Report Authenticated By: Arnell Sieving, M.D.    Dg Knee Complete 4 Views Right  02/26/2012  *RADIOLOGY REPORT*  Clinical Data: Larey Seat and injured right knee.  RIGHT KNEE - COMPLETE 4+ VIEW  Comparison: Right knee x-rays 08/16/2010.  Findings: No evidence of acute fracture or dislocation.  Severe medial compartment joint space narrowing and associated hypertrophic changes.  Mild patellofemoral and lateral compartment joint space narrowing and associated hypertrophic changes. Phleboliths in the subcutaneous tissues anterior to the patella. Moderate sized joint effusion, similar to the prior examination.  IMPRESSION: No acute osseous abnormality.  Osteoarthritis, worst in the medial compartment.  Moderate sized joint effusion.   Original Report  Authenticated By: Arnell Sieving, M.D.      aso splint applied, pain improved, remains NV intact   MDM     Patient agrees to close follow-up with her PMD.  No focal neuro deficits, ambulates with a steady gait. Likely musculoskeletal injury.   Will also give referral for orthopedics  The patient appears reasonably screened and/or stabilized for discharge and I doubt any other medical condition or other [facility] requiring further screening, evaluation, or treatment in the ED at this time prior to discharge.    Prescribed: naprosyn  November Sypher L. [place], [place] 02/28/12 1831

## 2012-02-28 NOTE — ED Provider Notes (Signed)
Medical screening examination/treatment/procedure(s) were performed by non-physician practitioner and as supervising physician I was immediately available for consultation/collaboration.   Shelda Jakes, MD 02/28/12 [phone]

## 2012-03-11 DIAGNOSIS — M25519 Pain in unspecified shoulder: Secondary | ICD-10-CM | POA: Diagnosis not present

## 2012-03-27 ENCOUNTER — Emergency Department (HOSPITAL_COMMUNITY): Payer: Medicare Other

## 2012-03-27 ENCOUNTER — Emergency Department (HOSPITAL_COMMUNITY)
Admission: EM | Admit: 2012-03-27 | Discharge: 2012-03-27 | Disposition: A | Discharge status: Home / Self Care | Payer: Medicare Other | Attending: Emergency Medicine | Admitting: Emergency Medicine

## 2012-03-27 ENCOUNTER — Encounter (HOSPITAL_COMMUNITY): Payer: Self-pay

## 2012-03-27 DIAGNOSIS — R279 Unspecified lack of coordination: Secondary | ICD-10-CM | POA: Diagnosis not present

## 2012-03-27 DIAGNOSIS — F17200 Nicotine dependence, unspecified, uncomplicated: Secondary | ICD-10-CM | POA: Insufficient documentation

## 2012-03-27 DIAGNOSIS — S0990XA Unspecified injury of head, initial encounter: Secondary | ICD-10-CM | POA: Diagnosis not present

## 2012-03-27 DIAGNOSIS — Y92009 Unspecified place in unspecified non-institutional (private) residence as the place of occurrence of the external cause: Secondary | ICD-10-CM | POA: Insufficient documentation

## 2012-03-27 DIAGNOSIS — Y9301 Activity, walking, marching and hiking: Secondary | ICD-10-CM | POA: Insufficient documentation

## 2012-03-27 DIAGNOSIS — S4980XA Other specified injuries of shoulder and upper arm, unspecified arm, initial encounter: Secondary | ICD-10-CM | POA: Insufficient documentation

## 2012-03-27 DIAGNOSIS — F1011 Alcohol abuse, in remission: Secondary | ICD-10-CM | POA: Insufficient documentation

## 2012-03-27 DIAGNOSIS — S46909A Unspecified injury of unspecified muscle, fascia and tendon at shoulder and upper arm level, unspecified arm, initial encounter: Secondary | ICD-10-CM | POA: Insufficient documentation

## 2012-03-27 DIAGNOSIS — Z8719 Personal history of other diseases of the digestive system: Secondary | ICD-10-CM | POA: Insufficient documentation

## 2012-03-27 DIAGNOSIS — S298XXA Other specified injuries of thorax, initial encounter: Secondary | ICD-10-CM | POA: Diagnosis not present

## 2012-03-27 DIAGNOSIS — M25519 Pain in unspecified shoulder: Secondary | ICD-10-CM | POA: Diagnosis not present

## 2012-03-27 DIAGNOSIS — G35 Multiple sclerosis: Secondary | ICD-10-CM | POA: Insufficient documentation

## 2012-03-27 DIAGNOSIS — Z79899 Other long term (current) drug therapy: Secondary | ICD-10-CM | POA: Insufficient documentation

## 2012-03-27 DIAGNOSIS — W19XXXA Unspecified fall, initial encounter: Secondary | ICD-10-CM

## 2012-03-27 DIAGNOSIS — Z8742 Personal history of other diseases of the female genital tract: Secondary | ICD-10-CM | POA: Insufficient documentation

## 2012-03-27 DIAGNOSIS — Z598 Other problems related to housing and economic circumstances: Secondary | ICD-10-CM | POA: Insufficient documentation

## 2012-03-27 DIAGNOSIS — R296 Repeated falls: Secondary | ICD-10-CM | POA: Insufficient documentation

## 2012-03-27 LAB — COMPREHENSIVE METABOLIC PANEL
ALT: 13 U/L (ref 0–35)
AST: 19 U/L (ref 0–37)
Alkaline Phosphatase: 62 U/L (ref 39–117)
CO2: 30 mEq/L (ref 19–32)
Calcium: 9.6 mg/dL (ref 8.4–10.5)
GFR calc non Af Amer: >90 mL/min (ref >90)
Glucose, Bld: 105 mg/dL — ABNORMAL HIGH (ref 70–99)
Potassium: 3.6 mEq/L (ref 3.5–5.1)
Sodium: 139 mEq/L (ref 135–145)
Total Protein: 7.2 g/dL (ref 6.0–8.3)

## 2012-03-27 LAB — CBC WITH DIFFERENTIAL/PLATELET
Basophils Absolute: 0 10*3/uL (ref 0.0–0.1)
Basophils Relative: 1 % (ref 0–1)
Eosinophils Absolute: 0.1 10*3/uL (ref 0.0–0.7)
Eosinophils Relative: 1 % (ref 0–5)
HCT: 40.2 % (ref 36.0–46.0)
Lymphocytes Relative: 50 % — ABNORMAL HIGH (ref 12–46)
MCH: 28.9 pg (ref 26.0–34.0)
MCHC: 36.3 g/dL — ABNORMAL HIGH (ref 30.0–36.0)
MCV: 79.4 fL (ref 78.0–100.0)
Monocytes Absolute: 0.5 10*3/uL (ref 0.1–1.0)
Platelets: 341 10*3/uL (ref 150–400)
RDW: 14.4 % (ref 11.5–15.5)
WBC: 7.5 10*3/uL (ref 4.0–10.5)

## 2012-03-27 LAB — URINALYSIS, ROUTINE W REFLEX MICROSCOPIC
Bilirubin Urine: NEGATIVE
Ketones, ur: NEGATIVE mg/dL
Leukocytes, UA: NEGATIVE
Nitrite: NEGATIVE
Protein, ur: NEGATIVE mg/dL
Urobilinogen, UA: 0.2 mg/dL (ref 0.0–1.0)
pH: 8.5 — ABNORMAL HIGH (ref 5.0–8.0)

## 2012-03-27 NOTE — ED Provider Notes (Signed)
History     CSN: [id number]  Arrival date & time 03/27/12  1017   First MD Initiated Contact with Patient 03/27/12 1021      Chief Complaint  Patient presents with  . Fall    HPI Pt was seen at 1055.  Per EMS, family and pt report, c/o sudden onset and resolution of one episode of fall that occurred this morning PTA.  Pt states she was walking with her walker to the bathroom in her apt and fell down. Pt was unable to get up on her own and she was incont of urine.  Family came to home and found her on the floor.  Pt only c/o acute flair of her chronic vague right shoulder pain since last month. Denies syncope, no CP/SOB, no abd pain, no N/V/D, no back pain, no focal motor weakness.    Past Medical History  Diagnosis Date  . Hepatitis     ETOH related   . ETOH abuse   . Hx of abnormal cervical Pap smear 1997  . Colon polyps 1997  . Genital warts   . Multiple sclerosis     Past Surgical History  Procedure Date  . Bilatetral tubal ligation     Family History  Problem Relation Age of Onset  . Coronary artery disease Brother   . Diabetes Brother   . Hypertension Brother   . Hypertension Mother   . Diabetes Mother   . Hypertension Sister   . Alcohol abuse      family history     History  Substance Use Topics  . Smoking status: Current Every Day Smoker -- 1.0 packs/day for 15 years    Types: Cigarettes  . Smokeless tobacco: Never Used  . Alcohol Use: No     Hx of ETOH abuse - dry since 1997.    Review of Systems ROS: Statement: All systems negative except as marked or noted in the HPI; Constitutional: Negative for fever and chills. ; ; Eyes: Negative for eye pain, redness and discharge. ; ; ENMT: Negative for ear pain, hoarseness, nasal congestion, sinus pressure and sore throat. ; ; Cardiovascular: Negative for chest pain, palpitations, diaphoresis, dyspnea and peripheral edema. ; ; Respiratory: Negative for cough, wheezing and stridor. ; ; Gastrointestinal: Negative  for nausea, vomiting, diarrhea, abdominal pain, blood in stool, hematemesis, jaundice and rectal bleeding. . ; ; Genitourinary: Negative for dysuria, flank pain and hematuria. ; ; Musculoskeletal: +right shoulder pain. Negative for back pain and neck pain. Negative for swelling.; ; Skin: Negative for pruritus, rash, abrasions, blisters, bruising and skin lesion.; ; Neuro: Negative for headache, lightheadedness and neck stiffness. Negative for altered level of consciousness , altered mental status, extremity weakness, paresthesias, involuntary movement, seizure and syncope.       Allergies  Review of patient's allergies indicates no known allergies.  Home Medications   Current Outpatient Rx  Name Route Sig Dispense Refill  . CYCLOBENZAPRINE HCL 10 MG PO TABS Oral Take 10 mg by mouth 2 (two) times daily.    Marland Kitchen GABAPENTIN 600 MG PO TABS Oral Take 1,200 mg by mouth 3 (three) times daily.     Marland Kitchen HYDROCODONE-ACETAMINOPHEN 5-325 MG PO TABS Oral Take 1 tablet by mouth every 6 (six) hours as needed. Pain    . LORATADINE 10 MG PO TABS Oral Take 10 mg by mouth daily.    Marland Kitchen MONTELUKAST SODIUM 10 MG PO TABS Oral Take 10 mg by mouth at bedtime.      Marland Kitchen  ADULT MULTIVITAMIN W/MINERALS CH Oral Take 1 tablet by mouth daily.    Marland Kitchen SIMVASTATIN 40 MG PO TABS Oral Take 40 mg by mouth at bedtime.      . TRAZODONE HCL 300 MG PO TABS Oral Take 300 mg by mouth at bedtime.    . VENLAFAXINE HCL ER 150 MG PO CP24 Oral Take 150 mg by mouth daily.      . VENLAFAXINE HCL ER 75 MG PO CP24 Oral Take 75 mg by mouth daily.        BP 156/119  Pulse 82  Temp 98.1 F (36.7 C) (Oral)  Resp 14  Ht 5\' 7"  (1.702 m)  SpO2 98%  Physical Exam 1100: Physical examination:  Nursing notes reviewed; Vital signs and O2 SAT reviewed;  Constitutional: Well developed, Well nourished, Well hydrated, In no acute distress; Head:  Normocephalic, atraumatic; Eyes: EOMI, PERRL, No scleral icterus; ENMT: Mouth and pharynx normal, Mucous membranes  moist; Neck: Supple, Full range of motion, No lymphadenopathy; Cardiovascular: Regular rate and rhythm, No gallop; Respiratory: Breath sounds clear & equal bilaterally, No rales, rhonchi, wheezes.  Speaking full sentences with ease, Normal respiratory effort/excursion; Chest: Nontender, Movement normal; Abdomen: Soft, Nontender, Nondistended, Normal bowel sounds;; Extremities: Pulses normal, No tenderness, No edema, No deformity. No open wounds. No erythema.  No ecchymosis.  No calf edema or asymmetry.; Neuro: AA&Ox3, Major CN grossly intact.  Speech clear. No facial droop.  Lifts and moves all ext off stretcher against gravity without apparent gross focal motor deficits or drift.; Skin: Color normal, Warm, Dry.    ED Course  Procedures    MDM  MDM Reviewed: nursing note, vitals and previous chart Interpretation: labs, x-ray and CT scan     Results for orders placed during the hospital encounter of 03/27/12  CBC WITH DIFFERENTIAL      Component Value Range   WBC 7.5  4.0 - 10.5 K/uL   RBC 5.06  3.87 - 5.11 MIL/uL   Hemoglobin 14.6  12.0 - 15.0 g/dL   HCT 16.1  09.6 - 04.5 %   MCV 79.4  78.0 - 100.0 fL   MCH 28.9  26.0 - 34.0 pg   MCHC 36.3 (*) 30.0 - 36.0 g/dL   RDW 40.9  81.1 - 91.4 %   Platelets 341  150 - 400 K/uL   Neutrophils Relative 42 (*) 43 - 77 %   Neutro Abs 3.1  1.7 - 7.7 K/uL   Lymphocytes Relative 50 (*) 12 - 46 %   Lymphs Abs 3.7  0.7 - 4.0 K/uL   Monocytes Relative 7  3 - 12 %   Monocytes Absolute 0.5  0.1 - 1.0 K/uL   Eosinophils Relative 1  0 - 5 %   Eosinophils Absolute 0.1  0.0 - 0.7 K/uL   Basophils Relative 1  0 - 1 %   Basophils Absolute 0.0  0.0 - 0.1 K/uL  URINALYSIS, ROUTINE W REFLEX MICROSCOPIC      Component Value Range   Color, Urine YELLOW  YELLOW   APPearance CLEAR  CLEAR   Specific Gravity, Urine 1.015  1.005 - 1.030   pH 8.5 (*) 5.0 - 8.0   Glucose, UA NEGATIVE  NEGATIVE mg/dL   Hgb urine dipstick NEGATIVE  NEGATIVE   Bilirubin Urine  NEGATIVE  NEGATIVE   Ketones, ur NEGATIVE  NEGATIVE mg/dL   Protein, ur NEGATIVE  NEGATIVE mg/dL   Urobilinogen, UA 0.2  0.0 - 1.0 mg/dL   Nitrite NEGATIVE  NEGATIVE   Leukocytes, UA NEGATIVE  NEGATIVE  COMPREHENSIVE METABOLIC PANEL      Component Value Range   Sodium 139  135 - 145 mEq/L   Potassium 3.6  3.5 - 5.1 mEq/L   Chloride 102  96 - 112 mEq/L   CO2 30  19 - 32 mEq/L   Glucose, Bld 105 (*) 70 - 99 mg/dL   BUN 7  6 - 23 mg/dL   Creatinine, Ser 7.82  0.50 - 1.10 mg/dL   Calcium 9.6  8.4 - 95.6 mg/dL   Total Protein 7.2  6.0 - 8.3 g/dL   Albumin 3.5  3.5 - 5.2 g/dL   AST 19  0 - 37 U/L   ALT 13  0 - 35 U/L   Alkaline Phosphatase 62  39 - 117 U/L   Total Bilirubin 0.4  0.3 - 1.2 mg/dL   GFR calc non Af Amer >90  >90 mL/min   GFR calc Af Amer >90  >90 mL/min  AMMONIA      Component Value Range   Ammonia 36  11 - 60 umol/L   Dg Chest 1 View 03/27/2012  *RADIOLOGY REPORT*  Clinical Data: Right shoulder pain  CHEST - 1 VIEW  Comparison: 04/04/2011  Findings: Hypoventilation with bibasilar atelectasis.  Cardiac enlargement with mild vascular congestion.  No pleural effusion.  IMPRESSION: Hypoventilation with bibasilar atelectasis.  Pulmonary vascular congestion without edema.   Original Report Authenticated By: Camelia Phenes, M.D.    Dg Shoulder Right 03/27/2012  *RADIOLOGY REPORT*  Clinical Data: Shoulder pain.  Fall  RIGHT SHOULDER - 2+ VIEW  Comparison: 02/26/2012  Findings: Negative for fracture.  Normal alignment.  No significant degenerative change.  IMPRESSION: Negative   Original Report Authenticated By: Camelia Phenes, M.D.    Ct Head Wo Contrast 03/27/2012  *RADIOLOGY REPORT*  Clinical Data: Fall  CT HEAD WITHOUT CONTRAST  Technique:  Contiguous axial images were obtained from the base of the skull through the vertex without contrast.  Comparison: 03/01/2008  Findings: Advanced atrophy.  Chronic microvascular ischemia in the white matter.  No acute infarct.  No mass.   Negative for skull fracture.  Image quality degraded by motion.  IMPRESSION: Atrophy and chronic ischemic change.  No acute abnormality.   Original Report Authenticated By: Camelia Phenes, M.D.      1345:  Pt states she does not want to stay in the ED any longer and wants to go home now.  No acute findings on workup today.  Pt's sister states pt has long hx of frequent falling and she has a sitting walker as well as a tricycle she gets around on.  Pt apparently has been refusing any help from family or social services, as well as refuses to be placed in an assisted living facility.  Pt's family and EMS concerned regarding pt's unhygienic living conditions.  Case Management/Social Worker consulted: states pt makes her own medical decisions and cannot "force" social services on pt (they explained this to family), and requested to place Home Health services order (completed) who will come to pt's apt today or tomorrow for eval.  Pt and family aware and agreeable with this plan. T/C to pt's PMD Dr. Felecia Shelling, case discussed, including:  HPI, pertinent PM/SHx, VS/PE, dx testing, ED course and treatment:  States he can see pt in his office on Friday (in 2 days).  Dx and testing d/w pt and family.  Questions answered.  Verb understanding, agreeable to d/c  home with outpt f/u.        Laray Anger, DO 03/29/12 1930

## 2012-03-27 NOTE — ED Notes (Signed)
CONTACT INFORMATION FOR FAMILY  Liliane Shi [phone] (sister) Zenaida Deed (niece) [phone] cell [phone] home

## 2012-03-27 NOTE — Clinical Social Work Note (Signed)
CSW received call from ED regarding pt's living conditions. Pt wants to return home and is alert and oriented. CM aware and to set up home health services.   [name], [place] [phone]

## 2012-03-27 NOTE — Care Management Note (Addendum)
    Page 1 of 2   03/27/2012     12:56:20 PM   CARE MANAGEMENT NOTE 03/27/2012  Patient:  Tami Nolan, Tami Nolan   Account Number:  [id number]  Date Initiated:  03/27/2012  Documentation initiated by:  Sharrie Rothman  Subjective/Objective Assessment:   Called to ED room 4 by CSW for [facility] on pt brought in by EMS. EMS reported pts house dirty with clothes in bathtub, pt had been smoking in her bed, and urinated on her self. CSW stated no intervention by herself but thought pt needed HH.     Action/Plan:   Pt agrees to [facility] with [facility] RN, aide, and SW. Alroy Bailiff of [facility] is aware and will collec the pts information from the chart. Pts PCP is Dr. Felecia Shelling.   Anticipated DC Date:  03/27/2012   Anticipated DC Plan:  HOME W HOME HEALTH SERVICES      DC Planning Services  CM consult      [facility] Choice  HOME HEALTH   Choice offered to / List presented to:  C-1 Patient        HH arranged  HH-1 RN  HH-6 SOCIAL WORKER  HH-4 NURSE'S AIDE      [facility] agency  [facility]   Status of service:  Completed, signed off Medicare Important Message given?   (If response is "NO", the following Medicare IM given date fields will be blank) Date Medicare IM given:   Date Additional Medicare IM given:    Discharge Disposition:  HOME W HOME HEALTH SERVICES  Per UR Regulation:    If discussed at Long Length of Stay Meetings, dates discussed:    Comments:  03/27/12 Arlyss Queen, RN BSN CM

## 2012-03-27 NOTE — ED Notes (Signed)
EMS reports ems was called because family couldn't get in touch with her.  EMS reports found pt laying on floor unable to get up.  Pt c/o r shoulder pain.  EMS says was concerned about slurred speech but says family didn't notice a difference in her speech.   EMS also reports pt was hypertensive.  Pt was incontinent of urine.  Pt alert and oriented at this time.

## 2012-03-27 NOTE — ED Notes (Signed)
Per ems pt's living conditions were atrocious with rotten food everywhere, medication bottles lying everywhere, and her bathtub was full of clothing. Per EMS pt had urinated on herself and has been smoking in her bed.

## 2012-03-28 DIAGNOSIS — G35 Multiple sclerosis: Secondary | ICD-10-CM | POA: Diagnosis not present

## 2012-03-29 DIAGNOSIS — Z9181 History of falling: Secondary | ICD-10-CM | POA: Diagnosis not present

## 2012-03-29 DIAGNOSIS — G35 Multiple sclerosis: Secondary | ICD-10-CM | POA: Diagnosis not present

## 2012-03-29 DIAGNOSIS — K759 Inflammatory liver disease, unspecified: Secondary | ICD-10-CM | POA: Diagnosis not present

## 2012-03-29 LAB — URINE CULTURE

## 2012-04-01 ENCOUNTER — Encounter (HOSPITAL_COMMUNITY): Payer: Self-pay | Admitting: *Deleted

## 2012-04-01 ENCOUNTER — Emergency Department (HOSPITAL_COMMUNITY)
Admission: EM | Admit: 2012-04-01 | Discharge: 2012-04-01 | Disposition: A | Discharge status: Other Acute Inpatient Hospital | Payer: Medicare Other | Attending: Emergency Medicine | Admitting: Emergency Medicine

## 2012-04-01 ENCOUNTER — Emergency Department (HOSPITAL_COMMUNITY): Payer: Medicare Other

## 2012-04-01 DIAGNOSIS — F17200 Nicotine dependence, unspecified, uncomplicated: Secondary | ICD-10-CM | POA: Insufficient documentation

## 2012-04-01 DIAGNOSIS — R209 Unspecified disturbances of skin sensation: Secondary | ICD-10-CM | POA: Diagnosis not present

## 2012-04-01 DIAGNOSIS — K7010 Alcoholic hepatitis without ascites: Secondary | ICD-10-CM | POA: Insufficient documentation

## 2012-04-01 DIAGNOSIS — M25569 Pain in unspecified knee: Secondary | ICD-10-CM | POA: Insufficient documentation

## 2012-04-01 DIAGNOSIS — M25579 Pain in unspecified ankle and joints of unspecified foot: Secondary | ICD-10-CM

## 2012-04-01 DIAGNOSIS — R279 Unspecified lack of coordination: Secondary | ICD-10-CM | POA: Diagnosis not present

## 2012-04-01 DIAGNOSIS — K759 Inflammatory liver disease, unspecified: Secondary | ICD-10-CM | POA: Diagnosis not present

## 2012-04-01 DIAGNOSIS — Z9181 History of falling: Secondary | ICD-10-CM | POA: Insufficient documentation

## 2012-04-01 DIAGNOSIS — R87619 Unspecified abnormal cytological findings in specimens from cervix uteri: Secondary | ICD-10-CM | POA: Insufficient documentation

## 2012-04-01 DIAGNOSIS — I6789 Other cerebrovascular disease: Secondary | ICD-10-CM | POA: Diagnosis not present

## 2012-04-01 DIAGNOSIS — Z79899 Other long term (current) drug therapy: Secondary | ICD-10-CM | POA: Insufficient documentation

## 2012-04-01 DIAGNOSIS — G35 Multiple sclerosis: Secondary | ICD-10-CM | POA: Diagnosis not present

## 2012-04-01 DIAGNOSIS — R6889 Other general symptoms and signs: Secondary | ICD-10-CM | POA: Diagnosis not present

## 2012-04-01 DIAGNOSIS — F1020 Alcohol dependence, uncomplicated: Secondary | ICD-10-CM | POA: Insufficient documentation

## 2012-04-01 DIAGNOSIS — M7989 Other specified soft tissue disorders: Secondary | ICD-10-CM | POA: Diagnosis not present

## 2012-04-01 NOTE — ED Notes (Addendum)
Per EMS, the patient is complaining of right leg, knee and ankle pain that started within the last 24 hours.  The patient has a history of MS and is very difficult to understand.  The pt reports a history of falling down and being seen here in the ED a few days ago.  Swelling noted to the right lower extremity and ankle.  The patient does not report any new falls or injury.  States she is only "visiting" the nursing home [facility] in [place], [place])

## 2012-04-01 NOTE — ED Provider Notes (Signed)
History     CSN: [id number]  Arrival date & time 04/01/12  0151   First MD Initiated Contact with Patient 04/01/12 0202      Chief Complaint  Patient presents with  . Leg Pain    (Consider location/radiation/quality/duration/timing/severity/associated sxs/prior treatment) HPI Tami Nolan is a 51 y.o. female with a h/o MS and recent falls who presents to the Emergency Department complaining of right ankle and knee pain having fallen several times in the last few days. She was seen here 03/27/12 s/p fall. She notes that her right ankle has been swollen since yesterday. Her right knee has had effusions in the past and has pain continuously. Since the ankle began swelling, the knee has been hurting worse. She has taken hydrocodone without relief.  PCP Dr. Felecia Shelling   Past Medical History  Diagnosis Date  . Hepatitis     ETOH related   . ETOH abuse   . Hx of abnormal cervical Pap smear 1997  . Colon polyps 1997  . Genital warts   . Multiple sclerosis     Past Surgical History  Procedure Date  . Bilatetral tubal ligation     Family History  Problem Relation Age of Onset  . Coronary artery disease Brother   . Diabetes Brother   . Hypertension Brother   . Hypertension Mother   . Diabetes Mother   . Hypertension Sister   . Alcohol abuse      family history     History  Substance Use Topics  . Smoking status: Current Every Day Smoker -- 1.0 packs/day for 15 years    Types: Cigarettes  . Smokeless tobacco: Never Used  . Alcohol Use: No     Hx of ETOH abuse - dry since 1997.    OB History    Grav Para Term Preterm Abortions TAB SAB Ect Mult Living                  Review of Systems  Constitutional: Negative for fever.       10 Systems reviewed and are negative for acute change except as noted in the HPI.  HENT: Negative for congestion.   Eyes: Negative for discharge and redness.  Respiratory: Negative for cough and shortness of breath.   Cardiovascular:  Negative for chest pain.  Gastrointestinal: Negative for vomiting and abdominal pain.  Musculoskeletal: Negative for back pain.       Right ankle pain and swelling. Right knee pain  Skin: Negative for rash.  Neurological: Negative for syncope, numbness and headaches.  Psychiatric/Behavioral:       No behavior change.    Allergies  Review of patient's allergies indicates no known allergies.  Home Medications   Current Outpatient Rx  Name Route Sig Dispense Refill  . CYCLOBENZAPRINE HCL 10 MG PO TABS Oral Take 10 mg by mouth 2 (two) times daily.    Marland Kitchen GABAPENTIN 600 MG PO TABS Oral Take 1,200 mg by mouth 3 (three) times daily.     . IBUPROFEN 800 MG PO TABS Oral Take 800 mg by mouth 2 (two) times daily.    Marland Kitchen LORATADINE 10 MG PO TABS Oral Take 10 mg by mouth daily.    Marland Kitchen MONTELUKAST SODIUM 10 MG PO TABS Oral Take 10 mg by mouth at bedtime.      . ADULT MULTIVITAMIN W/MINERALS CH Oral Take 1 tablet by mouth daily.    Marland Kitchen SIMVASTATIN 40 MG PO TABS Oral Take 40 mg by mouth  at bedtime.      . TRAZODONE HCL 300 MG PO TABS Oral Take 300 mg by mouth at bedtime.    . VENLAFAXINE HCL ER 150 MG PO CP24 Oral Take 150 mg by mouth daily.      . VENLAFAXINE HCL ER 75 MG PO CP24 Oral Take 75 mg by mouth daily.      Marland Kitchen HYDROCODONE-ACETAMINOPHEN 5-325 MG PO TABS Oral Take 1 tablet by mouth every 6 (six) hours as needed. Pain      BP 142/96  Pulse 87  Temp 98.4 F (36.9 C) (Oral)  Resp 20  SpO2 95%  Physical Exam  Nursing note and vitals reviewed. Constitutional:       Awake, alert, nontoxic appearance.  HENT:  Head: Atraumatic.  Eyes: Right eye exhibits no discharge. Left eye exhibits no discharge.  Neck: Neck supple.  Pulmonary/Chest: Effort normal. She exhibits no tenderness.  Abdominal: Soft. There is no tenderness. There is no rebound.  Musculoskeletal: She exhibits no tenderness.       Baseline ROM, no obvious new focal weakness.Right knee with bruising to the medial aspect. Arthritic  changes, no effusion, FROM, mild crepitus. Right ankle and dorsum of the foot swollen and mildly tender with palpation.No obvious deformity noted.  Neurological:       Mental status and motor strength appears baseline for patient and situation.Speech is hard to understand however is at her baseline. No facial asymetry  Skin: No rash noted.  Psychiatric: She has a normal mood and affect.    ED Course  Procedures (including critical care time)  Dg Ankle Complete Right  04/01/2012  *RADIOLOGY REPORT*  Clinical Data: Swelling.  RIGHT ANKLE - COMPLETE 3+ VIEW  Comparison: Plain films of the ankle 02/26/2012.  Findings: There is new marked soft tissue swelling about the ankle and lower leg.  No fracture or dislocation is identified.  No notable degenerative changes seen.  IMPRESSION: Soft tissue swelling about the lower leg and ankle.  No underlying bony or joint abnormality.   Original Report Authenticated By: Bernadene Bell. D'ALESSIO, M.D.      MDM  Patient with h/o frequent falls here with right ankle and knee pain. Xray of ankle shows soft tissue swelling only with no fracture. Dx testing d/w pt.  Questions answered.  Verb understanding, agreeable to d/c home with outpt f/u.Pt stable in ED with no significant deterioration in condition.The patient appears reasonably screened and/or stabilized for discharge and I doubt any other medical condition or other [facility] requiring further screening, evaluation, or treatment in the ED at this time prior to discharge.  MDM Reviewed: nursing note and vitals Interpretation: x-ray            Nicoletta Dress. Colon Branch, MD 04/01/12 [phone]

## 2012-04-01 NOTE — ED Notes (Signed)
Pt discharged. Pt stable at time of discharge. pt has no questions regarding discharge at this time. Pt voiced understanding of discharge instructions.  

## 2012-04-03 DIAGNOSIS — K759 Inflammatory liver disease, unspecified: Secondary | ICD-10-CM | POA: Diagnosis not present

## 2012-04-03 DIAGNOSIS — G35 Multiple sclerosis: Secondary | ICD-10-CM | POA: Diagnosis not present

## 2012-04-03 DIAGNOSIS — Z9181 History of falling: Secondary | ICD-10-CM | POA: Diagnosis not present

## 2012-04-04 DIAGNOSIS — R6889 Other general symptoms and signs: Secondary | ICD-10-CM | POA: Diagnosis not present

## 2012-04-05 ENCOUNTER — Inpatient Hospital Stay (HOSPITAL_COMMUNITY)
Admission: EM | Admit: 2012-04-05 | Discharge: 2012-04-22 | DRG: 065 | Disposition: A | Discharge status: Skilled Nursing Facility | Payer: Medicare Other | Attending: Internal Medicine | Admitting: Internal Medicine

## 2012-04-05 ENCOUNTER — Other Ambulatory Visit: Payer: Self-pay

## 2012-04-05 ENCOUNTER — Inpatient Hospital Stay (HOSPITAL_COMMUNITY): Payer: Medicare Other

## 2012-04-05 ENCOUNTER — Encounter (HOSPITAL_COMMUNITY): Payer: Self-pay | Admitting: *Deleted

## 2012-04-05 ENCOUNTER — Emergency Department (HOSPITAL_COMMUNITY): Payer: Medicare Other

## 2012-04-05 ENCOUNTER — Inpatient Hospital Stay: Admit: 2012-04-05 | Payer: Self-pay | Admitting: Family Medicine

## 2012-04-05 DIAGNOSIS — I639 Cerebral infarction, unspecified: Secondary | ICD-10-CM | POA: Diagnosis present

## 2012-04-05 DIAGNOSIS — G8190 Hemiplegia, unspecified affecting unspecified side: Secondary | ICD-10-CM | POA: Diagnosis present

## 2012-04-05 DIAGNOSIS — E785 Hyperlipidemia, unspecified: Secondary | ICD-10-CM | POA: Diagnosis present

## 2012-04-05 DIAGNOSIS — F3289 Other specified depressive episodes: Secondary | ICD-10-CM | POA: Diagnosis present

## 2012-04-05 DIAGNOSIS — I6789 Other cerebrovascular disease: Secondary | ICD-10-CM | POA: Diagnosis not present

## 2012-04-05 DIAGNOSIS — R6889 Other general symptoms and signs: Secondary | ICD-10-CM | POA: Diagnosis not present

## 2012-04-05 DIAGNOSIS — G35 Multiple sclerosis: Secondary | ICD-10-CM | POA: Diagnosis present

## 2012-04-05 DIAGNOSIS — E876 Hypokalemia: Secondary | ICD-10-CM | POA: Diagnosis present

## 2012-04-05 DIAGNOSIS — M6282 Rhabdomyolysis: Secondary | ICD-10-CM | POA: Diagnosis present

## 2012-04-05 DIAGNOSIS — M629 Disorder of muscle, unspecified: Secondary | ICD-10-CM | POA: Diagnosis not present

## 2012-04-05 DIAGNOSIS — E782 Mixed hyperlipidemia: Secondary | ICD-10-CM | POA: Diagnosis not present

## 2012-04-05 DIAGNOSIS — F17200 Nicotine dependence, unspecified, uncomplicated: Secondary | ICD-10-CM | POA: Diagnosis not present

## 2012-04-05 DIAGNOSIS — I10 Essential (primary) hypertension: Secondary | ICD-10-CM | POA: Diagnosis present

## 2012-04-05 DIAGNOSIS — Z720 Tobacco use: Secondary | ICD-10-CM

## 2012-04-05 DIAGNOSIS — I6330 Cerebral infarction due to thrombosis of unspecified cerebral artery: Secondary | ICD-10-CM | POA: Diagnosis not present

## 2012-04-05 DIAGNOSIS — R5381 Other malaise: Secondary | ICD-10-CM | POA: Diagnosis not present

## 2012-04-05 DIAGNOSIS — I6350 Cerebral infarction due to unspecified occlusion or stenosis of unspecified cerebral artery: Principal | ICD-10-CM | POA: Diagnosis present

## 2012-04-05 DIAGNOSIS — F329 Major depressive disorder, single episode, unspecified: Secondary | ICD-10-CM | POA: Diagnosis present

## 2012-04-05 DIAGNOSIS — T07XXXA Unspecified multiple injuries, initial encounter: Secondary | ICD-10-CM

## 2012-04-05 DIAGNOSIS — R4182 Altered mental status, unspecified: Secondary | ICD-10-CM | POA: Diagnosis not present

## 2012-04-05 DIAGNOSIS — S7000XA Contusion of unspecified hip, initial encounter: Secondary | ICD-10-CM | POA: Diagnosis not present

## 2012-04-05 DIAGNOSIS — M6281 Muscle weakness (generalized): Secondary | ICD-10-CM | POA: Diagnosis not present

## 2012-04-05 DIAGNOSIS — I69959 Hemiplegia and hemiparesis following unspecified cerebrovascular disease affecting unspecified side: Secondary | ICD-10-CM | POA: Diagnosis not present

## 2012-04-05 DIAGNOSIS — R404 Transient alteration of awareness: Secondary | ICD-10-CM | POA: Diagnosis not present

## 2012-04-05 DIAGNOSIS — R279 Unspecified lack of coordination: Secondary | ICD-10-CM | POA: Diagnosis not present

## 2012-04-05 DIAGNOSIS — G459 Transient cerebral ischemic attack, unspecified: Secondary | ICD-10-CM | POA: Diagnosis not present

## 2012-04-05 LAB — CBC WITH DIFFERENTIAL/PLATELET
Basophils Absolute: 0 10*3/uL (ref 0.0–0.1)
HCT: 41.8 % (ref 36.0–46.0)
Lymphocytes Relative: 28 % (ref 12–46)
Monocytes Absolute: 0.9 10*3/uL (ref 0.1–1.0)
Neutro Abs: 4.9 10*3/uL (ref 1.7–7.7)
RBC: 5.36 MIL/uL — ABNORMAL HIGH (ref 3.87–5.11)
RDW: 14.1 % (ref 11.5–15.5)
WBC: 8.1 10*3/uL (ref 4.0–10.5)

## 2012-04-05 LAB — RAPID URINE DRUG SCREEN, HOSP PERFORMED
Amphetamines: NOT DETECTED
Benzodiazepines: NOT DETECTED
Opiates: NOT DETECTED

## 2012-04-05 LAB — COMPREHENSIVE METABOLIC PANEL
ALT: 25 U/L (ref 0–35)
AST: 51 U/L — ABNORMAL HIGH (ref 0–37)
CO2: 25 mEq/L (ref 19–32)
Chloride: 98 mEq/L (ref 96–112)
GFR calc non Af Amer: >90 mL/min (ref >90)
Sodium: 137 mEq/L (ref 135–145)
Total Bilirubin: 0.5 mg/dL (ref 0.3–1.2)

## 2012-04-05 LAB — URINALYSIS, ROUTINE W REFLEX MICROSCOPIC
Glucose, UA: NEGATIVE mg/dL
Leukocytes, UA: NEGATIVE
pH: 5.5 (ref 5.0–8.0)

## 2012-04-05 LAB — URINE MICROSCOPIC-ADD ON

## 2012-04-05 MED ORDER — SODIUM CHLORIDE 0.9 % IV SOLN
INTRAVENOUS | Status: DC
  Administered 2012-04-05: 14:00:00 via INTRAVENOUS

## 2012-04-05 MED ORDER — ENOXAPARIN SODIUM 40 MG/0.4ML ~~LOC~~ SOLN
40.0000 mg | SUBCUTANEOUS | Status: DC
  Administered 2012-04-05 – 2012-04-21 (×17): 40 mg via SUBCUTANEOUS
  Filled 2012-04-05 (×19): qty 0.4

## 2012-04-05 MED ORDER — NICOTINE 21 MG/24HR TD PT24
21.0000 mg | MEDICATED_PATCH | Freq: Every day | TRANSDERMAL | Status: DC
  Administered 2012-04-05 – 2012-04-22 (×18): 21 mg via TRANSDERMAL
  Filled 2012-04-05 (×19): qty 1

## 2012-04-05 MED ORDER — ASPIRIN 325 MG PO TABS
325.0000 mg | ORAL_TABLET | Freq: Once | ORAL | Status: AC
  Administered 2012-04-05: 325 mg via ORAL
  Filled 2012-04-05: qty 1

## 2012-04-05 MED ORDER — SODIUM CHLORIDE 0.9 % IV SOLN: INTRAVENOUS | Status: DC

## 2012-04-05 MED ORDER — SENNOSIDES-DOCUSATE SODIUM 8.6-50 MG PO TABS
1.0000 | ORAL_TABLET | Freq: Every evening | ORAL | Status: DC | PRN
  Administered 2012-04-14: 1 via ORAL
  Filled 2012-04-05: qty 1

## 2012-04-05 MED ORDER — POTASSIUM CHLORIDE CRYS ER 20 MEQ PO TBCR
40.0000 meq | EXTENDED_RELEASE_TABLET | Freq: Once | ORAL | Status: AC
  Administered 2012-04-05: 40 meq via ORAL
  Filled 2012-04-05: qty 2

## 2012-04-05 MED ORDER — SODIUM CHLORIDE 0.45 % IV SOLN
INTRAVENOUS | Status: DC
  Administered 2012-04-05: 22:00:00 via INTRAVENOUS

## 2012-04-05 NOTE — ED Notes (Signed)
[facility]  [facility] at [facility]. It will be about 7P before they can come, so  [facility] was called and will dispatch a truck .  Nurse informed. I called [facility] back with update.

## 2012-04-05 NOTE — ED Notes (Signed)
Top partial dentures, house keys, photo ID and clothes transported with pt by EMS.

## 2012-04-05 NOTE — ED Notes (Signed)
Per EMS - pt lives alone - called out by DSS.  Reports pt was found on floor by DSS with altered mental status.  CBG en route 160.  Pt states was lying on the floor because she "got hot."  Unknown down time.  Pt alert and oriented x 3 at this time.
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
## 2012-04-05 NOTE — H&P (Signed)
[facility] History and Physical  Tami Nolan:[id number] DOB: 23-Dec-1960 DOA: 04/05/2012  Referring physician:  PCP: [name], MD  Specialists:   Chief Complaint: Weakness Right side  HPI: Tami Nolan is a 51 y.o. female who was transferred from the [facility] ED for further evaluation and treatment after being found in her home on the floor for an unknown amount of time.  She states she was unable to get up due to weakness of her right side.  She does not know how long she had been on the floor.  Her neighbors reported to EMS that they had not seen her for 2 days.  When EMS came to her home her home was in disarray, and she did not want to go to the hospital.  At the ED IVC papers were taken out so that the patient would remain for evaluations and treatment.  She was transferred to [facility] since Neurology and Psychiatry specialists are not available on the weekends at [facility]     Review of Systems: The patient denies anorexia, fever, weight loss, vision loss, decreased hearing, hoarseness, chest pain, syncope, dyspnea on exertion, peripheral edema, balance deficits, hemoptysis, abdominal pain, melena, hematochezia, severe indigestion/heartburn, hematuria, incontinence, genital sores, muscle weakness, suspicious skin lesions, transient blindness, difficulty walking, depression, unusual weight change, abnormal bleeding, enlarged lymph nodes, angioedema, and breast masses.    Past Medical History  Diagnosis Date  . Hepatitis     ETOH related   . ETOH abuse   . Hx of abnormal cervical Pap smear 1997  . Colon polyps 1997  . Genital warts   . Multiple sclerosis    Past Surgical History  Procedure Date  . Bilatetral tubal ligation      Medications:  HOME MEDS: Prior to Admission medications   Medication Sig Start Date End Date Taking? Authorizing Provider  cyclobenzaprine (FLEXERIL) 10 MG tablet Take 10 mg by mouth 2 (two) times daily.   Yes  Historical Provider, MD  gabapentin (NEURONTIN) 600 MG tablet Take 1,200 mg by mouth 3 (three) times daily.    Yes Historical Provider, MD  HYDROcodone-acetaminophen (NORCO/VICODIN) 5-325 MG per tablet Take 1 tablet by mouth every 4 (four) hours as needed. Pain   Yes Historical Provider, MD  ibuprofen (ADVIL,MOTRIN) 800 MG tablet Take 800 mg by mouth 2 (two) times daily.   Yes Historical Provider, MD  loratadine (CLARITIN) 10 MG tablet Take 10 mg by mouth daily.   Yes Historical Provider, MD  montelukast (SINGULAIR) 10 MG tablet Take 10 mg by mouth at bedtime.     Yes Historical Provider, MD  Multiple Vitamin (MULTIVITAMIN WITH MINERALS) TABS Take 1 tablet by mouth daily.   Yes Historical Provider, MD  simvastatin (ZOCOR) 40 MG tablet Take 40 mg by mouth at bedtime.     Yes Historical Provider, MD  trazodone (DESYREL) 300 MG tablet Take 300 mg by mouth at bedtime.   Yes Historical Provider, MD  venlafaxine (EFFEXOR-XR) 150 MG 24 hr capsule Take 150 mg by mouth daily.     Yes Historical Provider, MD  venlafaxine (EFFEXOR-XR) 75 MG 24 hr capsule Take 75 mg by mouth daily.     Yes Historical Provider, MD    No Known Allergies   Social History:  Lives Alone,  She reports that she has been smoking Cigarettes.  She has a 15 pack-year smoking history. She has never used smokeless tobacco. She reports that she does not drink alcohol or use  illicit drugs.    Family History  Problem Relation Age of Onset  . Coronary artery disease Brother   . Diabetes Brother   . Hypertension Brother   . Hypertension Mother   . Diabetes Mother   . Hypertension Sister   . Alcohol abuse      family history       Physical Exam:  GEN:   Pleasant 51 year old well nourished and well developed African American Female examined  and in no acute distress; cooperative with exam Filed Vitals:   04/05/12 1643 04/05/12 1700 04/05/12 1822 04/05/12 2030  BP:  140/97 161/96 140/94  Pulse:  109 98 98  Temp: 98.2 F (36.8  C)  98.1 F (36.7 C) 98.2 F (36.8 C)  TempSrc:   Oral   Resp:  18 18 18   Height:      Weight:      SpO2:  98% 98% 91%   Blood pressure 140/94, pulse 98, temperature 98.2 F (36.8 C), temperature source Oral, resp. rate 18, height 5\' 7"  (1.702 m), weight 79.379 kg (175 lb), SpO2 91.00%. PSYCH: She is alert and oriented x4; does not appear anxious does not appear depressed; affect is normal HEENT: Normocephalic and Atraumatic, Mucous membranes pink; PERRLA; EOM intact; Fundi:  Benign;  No scleral icterus, Nares: Patent, Oropharynx: Clear, Edentulous, Neck:  FROM, no cervical lymphadenopathy nor thyromegaly or carotid bruit; no JVD; Breasts:: Not examined CHEST WALL: No tenderness CHEST: Normal respiration, clear to auscultation bilaterally HEART: Regular rate and rhythm; no murmurs rubs or gallops BACK: No kyphosis or scoliosis; no CVA tenderness ABDOMEN: Positive Bowel Sounds, Obese, soft non-tender; no masses, no organomegaly, no pannus; no intertriginous candida. Rectal Exam: Not done EXTREMITIES: No bone or joint deformity; age-appropriate arthropathy of the hands and knees; no cyanosis, clubbing or edema; no ulcerations. Genitalia: not examined PULSES: 2+ and symmetric SKIN: Normal hydration no rash or ulceration CNS: Cranial nerves 2-12 grossly intact, No Facial Droop, +Right Pronator Drifting, Decreased Deltoid, Biceps and Grip Strength of the RUE  3/5,  And Decreased Strength  In RLE 3/5.    Labs on Admission:  Basic Metabolic Panel:  Lab 04/05/12 1610  NA 137  K 3.1*  CL 98  CO2 25  GLUCOSE 112*  BUN 14  CREATININE 0.41*  CALCIUM 10.1  MG --  PHOS --   Liver Function Tests:  Lab 04/05/12 1319  AST 51*  ALT 25  ALKPHOS 67  BILITOT 0.5  PROT 8.3  ALBUMIN 4.0   No results found for this basename: LIPASE:5,AMYLASE:5 in the last 168 hours No results found for this basename: AMMONIA:5 in the last 168 hours CBC:  Lab 04/05/12 1319  WBC 8.1  NEUTROABS 4.9    HGB 15.4*  HCT 41.8  MCV 78.0  PLT 290   Cardiac Enzymes:  Lab 04/05/12 1319  CKTOTAL 1132*  CKMB --  CKMBINDEX --  TROPONINI <0.30    BNP (last 3 results) No results found for this basename: PROBNP:3 in the last 8760 hours CBG: No results found for this basename: GLUCAP:5 in the last 168 hours  Radiological Exams on Admission: Dg Chest 1 View  04/05/2012  *RADIOLOGY REPORT*  Clinical Data: 51 year old female altered mental status and confusion.  CHEST - 1 VIEW  Comparison: 03/27/2012 and earlier.  Findings: AP semi upright portable view 1316 hours.  The patient is rotated to the right.  Stable or slightly improved lung volumes. Stable cardiomegaly and mediastinal contours.  Allowing for  portable technique, the lungs are clear.  No pneumothorax or effusion.  Lower thoracic and lumbar spinal hardware partially visible.  IMPRESSION: No acute cardiopulmonary abnormality.   Original Report Authenticated By: Erskine Speed, M.D.    Dg Hip Complete Right  04/05/2012  *RADIOLOGY REPORT*  Clinical Data: Altered mental status.  Bruise on right hip.  RIGHT HIP - COMPLETE 2+ VIEW  Comparison: No priors.  Findings: AP view of the pelvis and AP and lateral views of the right hip demonstrate no acute fracture, subluxation, dislocation, joint or soft tissue abnormality.  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  IMPRESSION: 1.  No acute radiographic abnormality of the bony pelvis or the right hip. 2.  Mild bilateral hip joint osteoarthritis.   Original Report Authenticated By: Trudie Reed, M.D.    Dg Knee 1-2 Views Left  04/05/2012  *RADIOLOGY REPORT*  Clinical Data: Pain.  Possible trauma.  LEFT KNEE - 1-2 VIEW  Comparison: 12/17/2004  Findings: AP and cross-table lateral views.  No acute fracture or dislocation.  No joint effusion.    Probable calcifications within the patellar tendon.  Present on the prior.  IMPRESSION: No acute osseous abnormality.   Original Report  Authenticated By: Jeronimo Greaves, M.D.    Dg Knee 1-2 Views Right  04/05/2012  *RADIOLOGY REPORT*  Clinical Data: Altered mental status.  Bruise on right hip.  RIGHT KNEE - 1-2 VIEW  Comparison: No priors.  Findings: AP and lateral views of the right knee demonstrate no acute fracture, subluxation, dislocation, joint or soft tissue abnormality.  Joint space narrowing and subchondral sclerosis is noted in the knee joint, most pronounced in the lateral compartment, compatible with osteoarthritis.  IMPRESSION: 1.  No acute radiographic abnormality of the right knee.   Original Report Authenticated By: Trudie Reed, M.D.    Ct Head Wo Contrast  04/05/2012  *RADIOLOGY REPORT*  Clinical Data: 51 year old female with altered mental status, confusion, right side weakness.  CT HEAD WITHOUT CONTRAST  Technique:  Contiguous axial images were obtained from the base of the skull through the vertex without contrast.  Comparison: 03/27/2012 and earlier.  Findings: Visualized paranasal sinuses and mastoids are clear. Visualized orbits and scalp soft tissues are within normal limits. No acute osseous abnormality identified.  Calcified atherosclerosis at the skull base.  Patchy confluent bilateral cerebral white matter hypodensity is stable from earlier this month.  This tracks to the deep white matter capsules as before.  Deep gray matter nuclei appear relatively spared.  The no ventriculomegaly. No midline shift, mass effect, or evidence of mass lesion.  No acute intracranial hemorrhage identified.  No evidence of cortically based acute infarction identified.  No suspicious intracranial vascular hyperdensity.  IMPRESSION: Stable cerebral white matter changes, favor small vessel disease related.   Original Report Authenticated By: Erskine Speed, M.D.    Mr Brain Wo Contrast  04/05/2012  *RADIOLOGY REPORT*  Clinical Data: Altered mental status.  Right-sided weakness. History of multiple sclerosis.  MRI HEAD WITHOUT CONTRAST   Technique:  Multiplanar, multiecho pulse sequences of the brain and surrounding structures were obtained according to standard protocol without intravenous contrast.  Comparison: 04/05/2012 CT.  06/11/2007 MR.  Findings: Motion degraded exam.  Focal area of restricted motion posterior aspect of the posterior limb of the left internal capsule is most suggestive of a non hemorrhagic acute infarct rather than restricted motion related to an active MS plaque.  Marked white matter type changes confluent periventricular and subcortical region and  in the central pons consistent with the patient's history of multiple sclerosis.  Small vessel disease type changes could contribute to some white matter type changes.  Global atrophy without hydrocephalus.  No intracranial mass lesion detected on this unenhanced motion degraded exam.  Diminutive size left vertebral artery which may be congenitally small in addition to atherosclerotic type changes.  The small size of the vertebral arteries and basilar artery may be explained partially by the fetal type portion of the posterior cerebral arteries.  Major intracranial vascular structures are patent.  IMPRESSION: Motion degraded exam.  Focal area of restricted motion posterior aspect of the posterior limb of the left internal capsule is more suggestive of a non hemorrhagic acute infarct rather than restricted motion related to an active MS plaque.  Marked white matter type changes confluent periventricular and subcortical region and in the central pons consistent with the patient's history of multiple sclerosis.  Small vessel disease type changes could contribute to some white matter type changes.  Global atrophy without hydrocephalus.  Critical Value/emergent results were called by telephone at the time of interpretation on 04/05/2012 at 3:15 p.m. to Dr. Lynelle Doctor.,, who verbally acknowledged these results.   Original Report Authenticated By: Lacy Duverney, M.D.     EKG: Independently  reviewed.   Assessment/Plan Principal Problem:  *Rhabdomyolysis Active Problems:  Stroke  Hypokalemia   Plan:    Admitted with Involuntary commitment papers for CVA Workup and Psych evaluation CVA Protocol Neuro checks PT/OT/ST evaluations IVFs for rehydration and monitor CPK levels due to Rhabdomyolysis Replete K+ Reconcile Home Medications DVT Prophylaxis Psych Consultation   Code Status:  FULL CODE Family Communication:  N/A Disposition Plan:  tba  Time spent: 30 Minutes  Ron Parker [facility] Pager [phone]  If 7PM-7AM, please contact night-coverage [email or web address] Password [facility] 04/05/2012, 9:17 PM

## 2012-04-05 NOTE — ED Notes (Signed)
Pt has bruising, purple in color noted to right hip, right thigh, and right knee.  Pt has ? Deformity to right hip.  edp notified, orders received.

## 2012-04-05 NOTE — ED Notes (Signed)
Pt has large bruise to medial right knee, purple in color.  Pt denies pain to area.

## 2012-04-05 NOTE — Significant Event (Signed)
[facility] accept note  Patient accepted from Dr. Freddrick March him and he can Hospital-51 year old female found down at home due to concerns of neighbors who stated that he had not seen her in a couple of days. Noted that patient was trying to smoke a cigarette while laying on the floor and had dysmotility of one arm. There was rotting food in the fridge as well as house being in disarray. Per Dr. Delford Field exam on exam patient had a right hemiplegia which then warranted further workup including in an acute stroke. Given that the patient was refusing hospitalization, patient had to be involuntary committed-patient will need further workup and management as there is no subspecialty neurology or psychiatry at the hospital at and 10 over the weekend and patient was accepted over to [facility]. I have requested Dr. Lynelle Doctor administer aspirin 325 mg by mouth now [if she passes a nursing swallow screen] as well as replace the potassium.   Pleas Koch, MD [facility] [phone]

## 2012-04-05 NOTE — ED Notes (Signed)
Per EDP, pt stable to be transported by [facility].

## 2012-04-06 ENCOUNTER — Inpatient Hospital Stay (HOSPITAL_COMMUNITY): Payer: Medicare Other

## 2012-04-06 DIAGNOSIS — I6789 Other cerebrovascular disease: Secondary | ICD-10-CM

## 2012-04-06 DIAGNOSIS — F17200 Nicotine dependence, unspecified, uncomplicated: Secondary | ICD-10-CM

## 2012-04-06 DIAGNOSIS — R404 Transient alteration of awareness: Secondary | ICD-10-CM

## 2012-04-06 DIAGNOSIS — Z720 Tobacco use: Secondary | ICD-10-CM

## 2012-04-06 LAB — LIPID PANEL
HDL: 60 mg/dL (ref >39)
Triglycerides: 73 mg/dL (ref <150)

## 2012-04-06 LAB — HEMOGLOBIN A1C
Hgb A1c MFr Bld: 5.6 % (ref <5.7)
Mean Plasma Glucose: 114 mg/dL (ref <117)

## 2012-04-06 LAB — MAGNESIUM: Magnesium: 2 mg/dL (ref 1.5–2.5)

## 2012-04-06 LAB — CK: Total CK: 683 U/L — ABNORMAL HIGH (ref 7–177)

## 2012-04-06 MED ORDER — GADOBENATE DIMEGLUMINE 529 MG/ML IV SOLN
15.0000 mL | Freq: Once | INTRAVENOUS | Status: AC | PRN
  Administered 2012-04-06: 15 mL via INTRAVENOUS

## 2012-04-06 MED ORDER — SODIUM CHLORIDE 0.9 % IV SOLN
INTRAVENOUS | Status: DC
  Administered 2012-04-06: 11:00:00 via INTRAVENOUS

## 2012-04-06 MED ORDER — POTASSIUM CHLORIDE CRYS ER 20 MEQ PO TBCR
40.0000 meq | EXTENDED_RELEASE_TABLET | Freq: Once | ORAL | Status: AC
  Administered 2012-04-06: 40 meq via ORAL
  Filled 2012-04-06: qty 2

## 2012-04-06 MED ORDER — LORAZEPAM 0.5 MG PO TABS
0.5000 mg | ORAL_TABLET | Freq: Two times a day (BID) | ORAL | Status: DC | PRN
  Administered 2012-04-06: 0.5 mg via ORAL
  Filled 2012-04-06: qty 1

## 2012-04-06 NOTE — Progress Notes (Signed)
UR COMPLETE.   Dewaine Morocho Wise Jimia Gentles, RN, BSN   Phone #[phone] 
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
## 2012-04-06 NOTE — Evaluation (Signed)
Physical Therapy Evaluation Patient Details Name: Tami Nolan MRN: [id number] DOB: Dec 18, 1960 Today's Date: 04/06/2012 Time: 4098-1191 PT Time Calculation (min): 16 min  PT Assessment / Plan / Recommendation Clinical Impression  Pt s/p fall at home and spent unknown amount of time on floor. Pt admitted with right side weakness and rhabdomyolysis and also history of MS and ETOH abuse. MRI impression: Motion degraded exam. Focal area of restricted motion posterior aspect of the posterior limb of the left internal capsule is more suggestive of a non hemorrhagic acute infarct rather than restricted motion related to an active MS plaque. Pt currently presenting with decreased strength, balance and safety. Pt will benefit from skilled PT in the acute care setting in order to maximize functional mobility and strength for a safe d/c    PT Assessment  Patient needs continued PT services    Follow Up Recommendations  Post acute inpatient    Does the patient have the potential to tolerate intense rehabilitation   No, Recommend SNF  Barriers to Discharge Decreased caregiver support;Inaccessible home environment pts home found in disarray with food rotting in fridge    Equipment Recommendations  3 in 1 bedside comode;Rolling walker with 5" wheels    Recommendations for Other Services     Frequency Min 3X/week    Precautions / Restrictions Precautions Precautions: Fall Restrictions Weight Bearing Restrictions: No   Pertinent Vitals/Pain Pt complained of back pain 5/10      Mobility  Bed Mobility Bed Mobility: Supine to Sit;Sitting - Scoot to Edge of Bed;Sit to Supine Supine to Sit: 1: +2 Total assist;With rails Supine to Sit: Patient Percentage: 50% Sitting - Scoot to Edge of Bed: 2: Max assist Sit to Supine: 1: +2 Total assist Sit to Supine: Patient Percentage: 50% Details for Bed Mobility Assistance: Assist to initiate and complete bed mobility tasks as well as physicall assist to  trunk and LEs for support in/out of bed. Transfers Transfers: Sit to Stand;Stand to Sit Sit to Stand: 1: +2 Total assist;From bed;With upper extremity assist Sit to Stand: Patient Percentage: 60% Stand to Sit: 1: +2 Total assist;To bed;With upper extremity assist Stand to Sit: Patient Percentage: 60% Details for Transfer Assistance: Assist to facilitate anterior weight shift and lift off from bed.   Ambulation/Gait Ambulation/Gait Assistance: Not tested (comment) Modified Rankin (Stroke Patients Only) Pre-Morbid Rankin Score: No symptoms Modified Rankin: Moderately severe disability    Shoulder Instructions     Exercises     PT Diagnosis: Difficulty walking;Altered mental status  PT Problem List: Decreased strength;Decreased activity tolerance;Decreased balance;Decreased mobility;Decreased cognition;Decreased knowledge of use of DME;Decreased safety awareness;Decreased knowledge of precautions PT Treatment Interventions: DME instruction;Gait training;Functional mobility training;Therapeutic activities;Therapeutic exercise;Balance training;Neuromuscular re-education;Cognitive remediation;Patient/family education   PT Goals Acute Rehab PT Goals PT Goal Formulation: Patient unable to participate in goal setting Time For Goal Achievement: 04/20/12 Potential to Achieve Goals: Fair Pt will go Supine/Side to Sit: with supervision PT Goal: Supine/Side to Sit - Progress: Goal set today Pt will go Sit to Supine/Side: with supervision PT Goal: Sit to Supine/Side - Progress: Goal set today Pt will go Sit to Stand: with min assist PT Goal: Sit to Stand - Progress: Goal set today Pt will go Stand to Sit: with min assist PT Goal: Stand to Sit - Progress: Goal set today Pt will Transfer Bed to Chair/Chair to Bed: with min assist PT Transfer Goal: Bed to Chair/Chair to Bed - Progress: Goal set today Pt will Ambulate: 51 - 150 feet;with  min assist;with least restrictive assistive device PT Goal:  Ambulate - Progress: Goal set today  Visit Information  Last PT Received On: 04/06/12 Assistance Needed: +2 PT/OT Co-Evaluation/Treatment: Yes    Subjective Data  Patient Stated Goal: to get out of here   Prior Functioning  Home Living Lives With: Alone Available Help at Discharge: Family;Friend(s);Available 24 hours/day Type of Home: Apartment Home Access: Stairs to enter Entrance Stairs-Number of Steps: 2 Entrance Stairs-Rails: Right;Left;Can reach both Home Layout: One level Bathroom Shower/Tub: Walk-in shower;Door [employer] Toilet: Standard Bathroom Accessibility: Yes How Accessible: Accessible via walker Home Adaptive Equipment: Straight cane;Walker - rolling Prior Function Level of Independence: Independent Able to Take Stairs?: Yes Driving: Yes Communication Communication: No difficulties Dominant Hand: Left    Cognition  Overall Cognitive Status: Impaired Area of Impairment: Attention;Following commands;Awareness of errors;Awareness of deficits;Safety/judgement Arousal/Alertness: Awake/alert Orientation Level: Disoriented to;Place;Time Behavior During Session: Restless Current Attention Level: Focused Following Commands: Follows one step commands inconsistently Safety/Judgement: Impulsive Safety/Judgement - Other Comments: Pt impulsively reaching for objects within reach while sitting EOB.  Awareness of Errors: Assistance required to identify errors made Awareness of Errors - Other Comments: Required assistance identify incorrect answers to cognitive questions. Awareness of Deficits: Pt unaware of balance deficits sitting EOB. Cognition - Other Comments: Pt overall very restless and fidgety.  Very distracted looking for her shoes and "doo rag" throughout session.    Extremity/Trunk Assessment Right Upper Extremity Assessment RUE ROM/Strength/Tone: Unable to fully assess;WFL for tasks assessed;Due to impaired cognition Left Upper Extremity Assessment LUE  ROM/Strength/Tone: Unable to fully assess;Due to impaired cognition;WFL for tasks assessed Right Lower Extremity Assessment RLE ROM/Strength/Tone: Unable to fully assess;Due to impaired cognition Left Lower Extremity Assessment LLE ROM/Strength/Tone: Unable to fully assess;Due to impaired cognition   Balance Balance Balance Assessed: Yes Static Sitting Balance Static Sitting - Balance Support: Bilateral upper extremity supported;Feet supported Static Sitting - Level of Assistance: 4: Min assist Static Sitting - Comment/# of Minutes: Pt sat EOB ~3 minutes. Assist due to posterior lean. Dynamic Sitting Balance Dynamic Sitting - Balance Support: During functional activity;Feet supported Dynamic Sitting - Level of Assistance: 3: Mod assist Dynamic Sitting Balance - Compensations: Pt with posterior lean while donning left sock. Static Standing Balance Static Standing - Balance Support: Bilateral upper extremity supported Static Standing - Level of Assistance: 1: +2 Total assist Static Standing - Comment/# of Minutes: Pt stood ~1 minutes with HHA x 2 with manual cueing through bil. posterior hips to facilitate extension at hips.  End of Session PT - End of Session Equipment Utilized During Treatment: Gait belt Activity Tolerance: Patient tolerated treatment well Patient left: in bed;with call bell/phone within reach;with bed alarm set;Other (comment) (sitter in room) Nurse Communication: Mobility status  GP     Milana Kidney 04/06/2012, 4:44 PM  04/06/2012 Milana Kidney DPT PAGER: [phone] OFFICE: [phone]

## 2012-04-06 NOTE — Consult Note (Signed)
Reason for Consult: capacity Referring Physician: unknown  Tami Nolan is an 51 y.o. female.  HPI:  Tami Nolan is a 51 y.o. female who was transferred from the [facility] ED for further evaluation and treatment after being found in her home on the floor for an unknown amount of time. She states she was unable to get up due to weakness of her right side. She does not know how long she had been on the floor. Her neighbors reported to EMS that they had not seen her for 2 days. When EMS came to her home her home was in disarray, and she did not want to go to the hospital. At the ED IVC papers were taken out so that the patient would remain for evaluations and treatment. She was transferred to [facility] since Neurology and Psychiatry specialists are not available on the weekends at [facility].   Seen today. She knows why she is here. Willing to stay and get the treatment now here. Willing to follow treatment plan here. Reports she has hx of MS and also reports recent stroke.   Past Medical History  Diagnosis Date  . Hepatitis     ETOH related   . ETOH abuse   . Hx of abnormal cervical Pap smear 1997  . Colon polyps 1997  . Genital warts   . Multiple sclerosis     Past Surgical History  Procedure Date  . Bilatetral tubal ligation     Family History  Problem Relation Age of Onset  . Coronary artery disease Brother   . Diabetes Brother   . Hypertension Brother   . Hypertension Mother   . Diabetes Mother   . Hypertension Sister   . Alcohol abuse      family history     Social History:  reports that she has been smoking Cigarettes.  She has a 15 pack-year smoking history. She has never used smokeless tobacco. She reports that she does not drink alcohol or use illicit drugs.  Allergies: No Known Allergies  Medications: I have reviewed the patient's current medications.  Results for orders placed during the hospital encounter of 04/05/12 (from the past 48 hour(s))    CBC WITH DIFFERENTIAL     Status: Abnormal   Collection Time   04/05/12  1:19 PM      Component Value Range Comment   WBC 8.1  4.0 - 10.5 K/uL    RBC 5.36 (*) 3.87 - 5.11 MIL/uL    Hemoglobin 15.4 (*) 12.0 - 15.0 g/dL    HCT 81.1  91.4 - 78.2 %    MCV 78.0  78.0 - 100.0 fL    MCH 28.7  26.0 - 34.0 pg    MCHC 36.8 (*) 30.0 - 36.0 g/dL    RDW 95.6  21.3 - 08.6 %    Platelets 290  150 - 400 K/uL    Neutrophils Relative 60  43 - 77 %    Neutro Abs 4.9  1.7 - 7.7 K/uL    Lymphocytes Relative 28  12 - 46 %    Lymphs Abs 2.2  0.7 - 4.0 K/uL    Monocytes Relative 11  3 - 12 %    Monocytes Absolute 0.9  0.1 - 1.0 K/uL    Eosinophils Relative 0  0 - 5 %    Eosinophils Absolute 0.0  0.0 - 0.7 K/uL    Basophils Relative 1  0 - 1 %  Basophils Absolute 0.0  0.0 - 0.1 K/uL   COMPREHENSIVE METABOLIC PANEL     Status: Abnormal   Collection Time   04/05/12  1:19 PM      Component Value Range Comment   Sodium 137  135 - 145 mEq/L    Potassium 3.1 (*) 3.5 - 5.1 mEq/L    Chloride 98  96 - 112 mEq/L    CO2 25  19 - 32 mEq/L    Glucose, Bld 112 (*) 70 - 99 mg/dL    BUN 14  6 - 23 mg/dL    Creatinine, Ser 1.61 (*) 0.50 - 1.10 mg/dL    Calcium 09.6  8.4 - 10.5 mg/dL    Total Protein 8.3  6.0 - 8.3 g/dL    Albumin 4.0  3.5 - 5.2 g/dL    AST 51 (*) 0 - 37 U/L    ALT 25  0 - 35 U/L    Alkaline Phosphatase 67  39 - 117 U/L    Total Bilirubin 0.5  0.3 - 1.2 mg/dL    GFR calc non Af Amer >90  >90 mL/min    GFR calc Af Amer >90  >90 mL/min   TROPONIN I     Status: Normal   Collection Time   04/05/12  1:19 PM      Component Value Range Comment   Troponin I <0.30  <0.30 ng/mL   CK     Status: Abnormal   Collection Time   04/05/12  1:19 PM      Component Value Range Comment   Total CK 1132 (*) 7 - 177 U/L   APTT     Status: Normal   Collection Time   04/05/12  1:19 PM      Component Value Range Comment   aPTT 32  24 - 37 seconds   PROTIME-INR     Status: Normal   Collection Time   04/05/12   1:19 PM      Component Value Range Comment   Prothrombin Time 12.8  11.6 - 15.2 seconds    INR 0.97  0.00 - 1.49   ETHANOL     Status: Normal   Collection Time   04/05/12  1:19 PM      Component Value Range Comment   Alcohol, Ethyl (B) <11  0 - 11 mg/dL   URINALYSIS, ROUTINE W REFLEX MICROSCOPIC     Status: Abnormal   Collection Time   04/05/12  3:45 PM      Component Value Range Comment   Color, Urine YELLOW  YELLOW    APPearance CLEAR  CLEAR    Specific Gravity, Urine >1.030 (*) 1.005 - 1.030    pH 5.5  5.0 - 8.0    Glucose, UA NEGATIVE  NEGATIVE mg/dL    Hgb urine dipstick LARGE (*) NEGATIVE    Bilirubin Urine SMALL (*) NEGATIVE    Ketones, ur 40 (*) NEGATIVE mg/dL    Protein, ur 045 (*) NEGATIVE mg/dL    Urobilinogen, UA 0.2  0.0 - 1.0 mg/dL    Nitrite NEGATIVE  NEGATIVE    Leukocytes, UA NEGATIVE  NEGATIVE   URINE RAPID DRUG SCREEN (HOSP PERFORMED)     Status: Normal   Collection Time   04/05/12  3:45 PM      Component Value Range Comment   Opiates NONE DETECTED  NONE DETECTED    Cocaine NONE DETECTED  NONE DETECTED    Benzodiazepines NONE DETECTED  NONE DETECTED  Amphetamines NONE DETECTED  NONE DETECTED    Tetrahydrocannabinol NONE DETECTED  NONE DETECTED    Barbiturates NONE DETECTED  NONE DETECTED   URINE MICROSCOPIC-ADD ON     Status: Abnormal   Collection Time   04/05/12  3:45 PM      Component Value Range Comment   Squamous Epithelial / LPF FEW (*) RARE    WBC, UA 0-2  <3 WBC/hpf    RBC / HPF 3-6  <3 RBC/hpf    Bacteria, UA RARE  RARE    Casts HYALINE CASTS (*) NEGATIVE   HEMOGLOBIN A1C     Status: Normal   Collection Time   04/06/12  6:35 AM      Component Value Range Comment   Hemoglobin A1C 5.6  <5.7 %    Mean Plasma Glucose 114  <117 mg/dL   LIPID PANEL     Status: Abnormal   Collection Time   04/06/12  6:35 AM      Component Value Range Comment   Cholesterol 200  0 - 200 mg/dL    Triglycerides 73  <409 mg/dL    HDL 60  >81 mg/dL    Total CHOL/HDL  Ratio 3.3      VLDL 15  0 - 40 mg/dL    LDL Cholesterol 191 (*) 0 - 99 mg/dL   CK     Status: Abnormal   Collection Time   04/06/12  6:35 AM      Component Value Range Comment   Total CK 683 (*) 7 - 177 U/L   MAGNESIUM     Status: Normal   Collection Time   04/06/12 10:54 AM      Component Value Range Comment   Magnesium 2.0  1.5 - 2.5 mg/dL     Dg Chest 1 View  47/01/2955  *RADIOLOGY REPORT*  Clinical Data: 50 year old female altered mental status and confusion.  CHEST - 1 VIEW  Comparison: 03/27/2012 and earlier.  Findings: AP semi upright portable view 1316 hours.  The patient is rotated to the right.  Stable or slightly improved lung volumes. Stable cardiomegaly and mediastinal contours.  Allowing for portable technique, the lungs are clear.  No pneumothorax or effusion.  Lower thoracic and lumbar spinal hardware partially visible.  IMPRESSION: No acute cardiopulmonary abnormality.   Original Report Authenticated By: Erskine Speed, M.D.    Dg Chest 2 View  04/05/2012  *RADIOLOGY REPORT*  Clinical Data: Stroke.  Right-sided weakness.  CHEST - 2 VIEW  Comparison: 04/05/2012 at 1315 hours.  Findings: Shallow inspiration.  Heart size and pulmonary vascularity are normal for inspiratory effort.  No focal airspace consolidation in the lungs.  No blunting of costophrenic angles. No pneumothorax.  Postoperative changes in the thoracolumbar spine. No significant change since previous study.  IMPRESSION: No evidence of active pulmonary disease.   Original Report Authenticated By: Burman Nieves, M.D.    Dg Hip Complete Right  04/05/2012  *RADIOLOGY REPORT*  Clinical Data: Altered mental status.  Bruise on right hip.  RIGHT HIP - COMPLETE 2+ VIEW  Comparison: No priors.  Findings: AP view of the pelvis and AP and lateral views of the right hip demonstrate no acute fracture, subluxation, dislocation, joint or soft tissue abnormality.  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   IMPRESSION: 1.  No acute radiographic abnormality of the bony pelvis or the right hip. 2.  Mild bilateral hip joint osteoarthritis.   Original Report Authenticated By: Trudie Reed, M.D.  Dg Knee 1-2 Views Left  04/05/2012  *RADIOLOGY REPORT*  Clinical Data: Pain.  Possible trauma.  LEFT KNEE - 1-2 VIEW  Comparison: 12/17/2004  Findings: AP and cross-table lateral views.  No acute fracture or dislocation.  No joint effusion.    Probable calcifications within the patellar tendon.  Present on the prior.  IMPRESSION: No acute osseous abnormality.   Original Report Authenticated By: Jeronimo Greaves, M.D.    Dg Knee 1-2 Views Right  04/05/2012  *RADIOLOGY REPORT*  Clinical Data: Altered mental status.  Bruise on right hip.  RIGHT KNEE - 1-2 VIEW  Comparison: No priors.  Findings: AP and lateral views of the right knee demonstrate no acute fracture, subluxation, dislocation, joint or soft tissue abnormality.  Joint space narrowing and subchondral sclerosis is noted in the knee joint, most pronounced in the lateral compartment, compatible with osteoarthritis.  IMPRESSION: 1.  No acute radiographic abnormality of the right knee.   Original Report Authenticated By: Trudie Reed, M.D.    Ct Head Wo Contrast  04/05/2012  *RADIOLOGY REPORT*  Clinical Data: 51 year old female with altered mental status, confusion, right side weakness.  CT HEAD WITHOUT CONTRAST  Technique:  Contiguous axial images were obtained from the base of the skull through the vertex without contrast.  Comparison: 03/27/2012 and earlier.  Findings: Visualized paranasal sinuses and mastoids are clear. Visualized orbits and scalp soft tissues are within normal limits. No acute osseous abnormality identified.  Calcified atherosclerosis at the skull base.  Patchy confluent bilateral cerebral white matter hypodensity is stable from earlier this month.  This tracks to the deep white matter capsules as before.  Deep gray matter nuclei appear  relatively spared.  The no ventriculomegaly. No midline shift, mass effect, or evidence of mass lesion.  No acute intracranial hemorrhage identified.  No evidence of cortically based acute infarction identified.  No suspicious intracranial vascular hyperdensity.  IMPRESSION: Stable cerebral white matter changes, favor small vessel disease related.   Original Report Authenticated By: Erskine Speed, M.D.    Mr Brain Wo Contrast  04/05/2012  *RADIOLOGY REPORT*  Clinical Data: Altered mental status.  Right-sided weakness. History of multiple sclerosis.  MRI HEAD WITHOUT CONTRAST  Technique:  Multiplanar, multiecho pulse sequences of the brain and surrounding structures were obtained according to standard protocol without intravenous contrast.  Comparison: 04/05/2012 CT.  06/11/2007 MR.  Findings: Motion degraded exam.  Focal area of restricted motion posterior aspect of the posterior limb of the left internal capsule is most suggestive of a non hemorrhagic acute infarct rather than restricted motion related to an active MS plaque.  Marked white matter type changes confluent periventricular and subcortical region and in the central pons consistent with the patient's history of multiple sclerosis.  Small vessel disease type changes could contribute to some white matter type changes.  Global atrophy without hydrocephalus.  No intracranial mass lesion detected on this unenhanced motion degraded exam.  Diminutive size left vertebral artery which may be congenitally small in addition to atherosclerotic type changes.  The small size of the vertebral arteries and basilar artery may be explained partially by the fetal type portion of the posterior cerebral arteries.  Major intracranial vascular structures are patent.  IMPRESSION: Motion degraded exam.  Focal area of restricted motion posterior aspect of the posterior limb of the left internal capsule is more suggestive of a non hemorrhagic acute infarct rather than restricted  motion related to an active MS plaque.  Marked white matter type changes confluent periventricular  and subcortical region and in the central pons consistent with the patient's history of multiple sclerosis.  Small vessel disease type changes could contribute to some white matter type changes.  Global atrophy without hydrocephalus.  Critical Value/emergent results were called by telephone at the time of interpretation on 04/05/2012 at 3:15 p.m. to Dr. Lynelle Doctor.,, who verbally acknowledged these results.   Original Report Authenticated By: Lacy Duverney, M.D.     ROS Blood pressure 154/97, pulse 104, temperature 98.5 F (36.9 C), temperature source Oral, resp. rate 18, height 5\' 7"  (1.702 m), weight 79.379 kg (175 lb), SpO2 100.00%. Physical Exam   MSE:   Appearance: on bed  Eye Contact:: Good  Speech: normal  Volume: Normal  Mood: ok  Affect: ristricted  Thought Process: organized  Orientation: Full  Thought Content: NO AVH  Suicidal Thoughts: No  Homicidal Thoughts: no  Memory: Recent; Poor  Judgement: fair  Insight: fair  Psychomotor Activity: Normal  Concentration: Fair  Recall: Fair  Akathisia: No  Assessment:   AXIS I: Delirium nos (resolving)  AXIS II: Deferred  AXIS III: see emdical hx ? ?  ? ?  ?  ? ?  ?  ?  ?  AXIS IV: disabled  AXIS V: 45  ?  Treatment Plan/Recommendations:  1. Pt is willing to stay and understand reasons for treatment at this time.   2. We can assess her again for capacity incase she refuse treatment and primary team has concerns about her decision making capacity  3. Will continue to follow as needed  Wonda Cerise 04/06/2012, 2:46 PM

## 2012-04-06 NOTE — Progress Notes (Signed)
[facility] PROGRESS NOTE  Tami Nolan ZOX:[id number] DOB: June 02, 1961 DOA: 04/05/2012 PCP: [name], MD  Assessment/Plan: 1. Stroke - Consulted Neuro and they will be managing and making further recommendations - Patient was given ASA 325 mg po yesterday evening. - statin was held on admission given patients rhabdomyolysis  2. Hypokalemia - Order magnesium levels - follow levels tomorrow - replete orally  3. Rhabdomyolysis - given history probably related to being on the floor for a prolonged period of time - CPK trending down - k dur 40 meq po today 04/06/12 - recheck level next am. - continue IVF's  4. Nicotine Abuse - Continue nicotine patch - recommend cessation  Patient was admitted against her will according to notes. As such I have consulted psychiatry for further evaluation.  Would like to know if patient is competent to make her own medical decisions.   Code Status: full Family Communication: No family at bedside Disposition Plan: Pending further work up and recommendations   Consultants:  Neurology  Psychiatry  Procedures:  MRI/CT of head  Antibiotics:  None  HPI/Subjective: Patient has no new complaints today.  Is interested at this juncture in further work up and recommendations as she does not want to have any further strokes in the future.  Objective: Filed Vitals:   04/06/12 0030 04/06/12 0230 04/06/12 0430 04/06/12 0630  BP: 146/95 150/95 143/99 154/97  Pulse: 102 95 99 104  Temp: 98 F (36.7 C) 98.5 F (36.9 C) 97.6 F (36.4 C) 98.5 F (36.9 C)  TempSrc:      Resp: 18 18 18 18   Height:      Weight:      SpO2: 91% 98% 94% 100%   No intake or output data in the 24 hours ending 04/06/12 1019 Filed Weights   04/05/12 1206  Weight: 79.379 kg (175 lb)    Exam:   General:  Pt in NAD, Alert and Awake  Cardiovascular: RRR, No mrg  Respiratory: CTA BL, no wheezes  Abdomen: soft, NT, ND  Neuro: Pt has right upper  and lower extremity weakness when compared to the left, answers questions appropriately  Data Reviewed: Basic Metabolic Panel:  Lab 04/05/12 8119  NA 137  K 3.1*  CL 98  CO2 25  GLUCOSE 112*  BUN 14  CREATININE 0.41*  CALCIUM 10.1  MG --  PHOS --   Liver Function Tests:  Lab 04/05/12 1319  AST 51*  ALT 25  ALKPHOS 67  BILITOT 0.5  PROT 8.3  ALBUMIN 4.0   No results found for this basename: LIPASE:5,AMYLASE:5 in the last 168 hours No results found for this basename: AMMONIA:5 in the last 168 hours CBC:  Lab 04/05/12 1319  WBC 8.1  NEUTROABS 4.9  HGB 15.4*  HCT 41.8  MCV 78.0  PLT 290   Cardiac Enzymes:  Lab 04/06/12 0635 04/05/12 1319  CKTOTAL 683* 1132*  CKMB -- --  CKMBINDEX -- --  TROPONINI -- <0.30   BNP (last 3 results) No results found for this basename: PROBNP:3 in the last 8760 hours CBG: No results found for this basename: GLUCAP:5 in the last 168 hours  Recent Results (from the past 240 hour(s))  URINE CULTURE     Status: Normal   Collection Time   03/27/12 12:04 PM      Component Value Range Status Comment   Specimen Description URINE, CATHETERIZED   Final    Special Requests NONE   Final    Culture  Setup  Time 03/28/2012 02:43   Final    Colony Count NO GROWTH   Final    Culture NO GROWTH   Final    Report Status 03/29/2012 FINAL   Final      Studies: Dg Chest 1 View  04/05/2012  *RADIOLOGY REPORT*  Clinical Data: 51 year old female altered mental status and confusion.  CHEST - 1 VIEW  Comparison: 03/27/2012 and earlier.  Findings: AP semi upright portable view 1316 hours.  The patient is rotated to the right.  Stable or slightly improved lung volumes. Stable cardiomegaly and mediastinal contours.  Allowing for portable technique, the lungs are clear.  No pneumothorax or effusion.  Lower thoracic and lumbar spinal hardware partially visible.  IMPRESSION: No acute cardiopulmonary abnormality.   Original Report Authenticated By: Erskine Speed,  M.D.    Dg Chest 2 View  04/05/2012  *RADIOLOGY REPORT*  Clinical Data: Stroke.  Right-sided weakness.  CHEST - 2 VIEW  Comparison: 04/05/2012 at 1315 hours.  Findings: Shallow inspiration.  Heart size and pulmonary vascularity are normal for inspiratory effort.  No focal airspace consolidation in the lungs.  No blunting of costophrenic angles. No pneumothorax.  Postoperative changes in the thoracolumbar spine. No significant change since previous study.  IMPRESSION: No evidence of active pulmonary disease.   Original Report Authenticated By: Burman Nieves, M.D.    Dg Hip Complete Right  04/05/2012  *RADIOLOGY REPORT*  Clinical Data: Altered mental status.  Bruise on right hip.  RIGHT HIP - COMPLETE 2+ VIEW  Comparison: No priors.  Findings: AP view of the pelvis and AP and lateral views of the right hip demonstrate no acute fracture, subluxation, dislocation, joint or soft tissue abnormality.  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  IMPRESSION: 1.  No acute radiographic abnormality of the bony pelvis or the right hip. 2.  Mild bilateral hip joint osteoarthritis.   Original Report Authenticated By: Trudie Reed, M.D.    Dg Knee 1-2 Views Left  04/05/2012  *RADIOLOGY REPORT*  Clinical Data: Pain.  Possible trauma.  LEFT KNEE - 1-2 VIEW  Comparison: 12/17/2004  Findings: AP and cross-table lateral views.  No acute fracture or dislocation.  No joint effusion.    Probable calcifications within the patellar tendon.  Present on the prior.  IMPRESSION: No acute osseous abnormality.   Original Report Authenticated By: Jeronimo Greaves, M.D.    Dg Knee 1-2 Views Right  04/05/2012  *RADIOLOGY REPORT*  Clinical Data: Altered mental status.  Bruise on right hip.  RIGHT KNEE - 1-2 VIEW  Comparison: No priors.  Findings: AP and lateral views of the right knee demonstrate no acute fracture, subluxation, dislocation, joint or soft tissue abnormality.  Joint space narrowing and subchondral  sclerosis is noted in the knee joint, most pronounced in the lateral compartment, compatible with osteoarthritis.  IMPRESSION: 1.  No acute radiographic abnormality of the right knee.   Original Report Authenticated By: Trudie Reed, M.D.    Ct Head Wo Contrast  04/05/2012  *RADIOLOGY REPORT*  Clinical Data: 51 year old female with altered mental status, confusion, right side weakness.  CT HEAD WITHOUT CONTRAST  Technique:  Contiguous axial images were obtained from the base of the skull through the vertex without contrast.  Comparison: 03/27/2012 and earlier.  Findings: Visualized paranasal sinuses and mastoids are clear. Visualized orbits and scalp soft tissues are within normal limits. No acute osseous abnormality identified.  Calcified atherosclerosis at the skull base.  Patchy confluent bilateral cerebral white matter hypodensity is  stable from earlier this month.  This tracks to the deep white matter capsules as before.  Deep gray matter nuclei appear relatively spared.  The no ventriculomegaly. No midline shift, mass effect, or evidence of mass lesion.  No acute intracranial hemorrhage identified.  No evidence of cortically based acute infarction identified.  No suspicious intracranial vascular hyperdensity.  IMPRESSION: Stable cerebral white matter changes, favor small vessel disease related.   Original Report Authenticated By: Erskine Speed, M.D.    Mr Brain Wo Contrast  04/05/2012  *RADIOLOGY REPORT*  Clinical Data: Altered mental status.  Right-sided weakness. History of multiple sclerosis.  MRI HEAD WITHOUT CONTRAST  Technique:  Multiplanar, multiecho pulse sequences of the brain and surrounding structures were obtained according to standard protocol without intravenous contrast.  Comparison: 04/05/2012 CT.  06/11/2007 MR.  Findings: Motion degraded exam.  Focal area of restricted motion posterior aspect of the posterior limb of the left internal capsule is most suggestive of a non hemorrhagic  acute infarct rather than restricted motion related to an active MS plaque.  Marked white matter type changes confluent periventricular and subcortical region and in the central pons consistent with the patient's history of multiple sclerosis.  Small vessel disease type changes could contribute to some white matter type changes.  Global atrophy without hydrocephalus.  No intracranial mass lesion detected on this unenhanced motion degraded exam.  Diminutive size left vertebral artery which may be congenitally small in addition to atherosclerotic type changes.  The small size of the vertebral arteries and basilar artery may be explained partially by the fetal type portion of the posterior cerebral arteries.  Major intracranial vascular structures are patent.  IMPRESSION: Motion degraded exam.  Focal area of restricted motion posterior aspect of the posterior limb of the left internal capsule is more suggestive of a non hemorrhagic acute infarct rather than restricted motion related to an active MS plaque.  Marked white matter type changes confluent periventricular and subcortical region and in the central pons consistent with the patient's history of multiple sclerosis.  Small vessel disease type changes could contribute to some white matter type changes.  Global atrophy without hydrocephalus.  Critical Value/emergent results were called by telephone at the time of interpretation on 04/05/2012 at 3:15 p.m. to Dr. Lynelle Doctor.,, who verbally acknowledged these results.   Original Report Authenticated By: Lacy Duverney, M.D.     Scheduled Meds:   . aspirin  325 mg Oral Once  . enoxaparin  40 mg Subcutaneous Q24H  . nicotine  21 mg Transdermal Daily  . potassium chloride SA  40 mEq Oral Once   Continuous Infusions:   . sodium chloride 75 mL/hr at 04/05/12 2203  . DISCONTD: sodium chloride 100 mL/hr at 04/05/12 1346  . DISCONTD: sodium chloride 75 mL/hr (04/05/12 1930)    Principal Problem:   *Rhabdomyolysis Active Problems:  Stroke  Hypokalemia    Time spent: > 35 minutes    Penny Pia  [facility] Pager [phone]. If 8PM-8AM, please contact night-coverage at [email or web address], password [facility] 04/06/2012, 10:19 AM  LOS: 1 day

## 2012-04-06 NOTE — Evaluation (Signed)
Occupational Therapy Evaluation Patient Details Name: Tami Nolan MRN: [id number] DOB: 08/12/1960 Today's Date: 04/06/2012 Time: 4696-2952 OT Time Calculation (min): 17 min  OT Assessment / Plan / Recommendation Clinical Impression  Pt transferred from [facility] to [facility]. Pt s/p fall at home and spent unknown amount of time on floor. Pt admitted with right side weakness and rhabdomyolysis and also history of MS and ETOH abuse. MRI impression: Motion degraded exam. Focal area of restricted motion posterior aspect of the posterior limb of the left internal capsule is more suggestive of a non hemorrhagic acute infarct rather than restricted motion related to an active MS plaque.  Will continue to follow acutely. Recommending SNF as pt will need 24/7 supervision/assist.    OT Assessment  Patient needs continued OT Services    Follow Up Recommendations  Skilled nursing facility    Barriers to Discharge Decreased caregiver support pt lives alone.   Equipment Recommendations  3 in 1 bedside comode    Recommendations for Other Services    Frequency  Min 2X/week    Precautions / Restrictions Precautions Precautions: Fall Restrictions Weight Bearing Restrictions: No   Pertinent Vitals/Pain See vitals    ADL  Eating/Feeding: Performed;Minimal assistance Where Assessed - Eating/Feeding: Edge of bed Lower Body Dressing: Performed;Moderate assistance Where Assessed - Lower Body Dressing: Supported sitting Toilet Transfer: Simulated;+2 Total assistance Toilet Transfer: Patient Percentage: 60% Toilet Transfer Method: Sit to Barista:  (bed) Equipment Used: Gait belt Transfers/Ambulation Related to ADLs: +2 assist for sit<>stand at bedside, ADL Comments: Min hand over hand assist while pt drank coffee sitting EOB (pt distracted and required hand over hand to prevent spilling). Pt able to don left sock by crossing ankle over knee but required assist to don  right sock.     OT Diagnosis: Generalized weakness;Cognitive deficits  OT Problem List: Impaired balance (sitting and/or standing);Decreased cognition;Decreased safety awareness;Decreased activity tolerance;Decreased strength OT Treatment Interventions: Self-care/ADL training;DME and/or AE instruction;Therapeutic activities;Cognitive remediation/compensation;Patient/family education;Balance training   OT Goals Acute Rehab OT Goals OT Goal Formulation: With patient Time For Goal Achievement: 04/20/12 Potential to Achieve Goals: Good ADL Goals Pt Will Perform Grooming: with supervision;Unsupported;Sitting, chair;Sitting, edge of bed;Other (comment) (min cueing to stay on task) ADL Goal: Grooming - Progress: Goal set today Pt Will Perform Upper Body Bathing: with supervision;Sitting, chair;Sitting, edge of bed;Unsupported;Other (comment) (min cueing to stay on task) ADL Goal: Upper Body Bathing - Progress: Goal set today Pt Will Perform Lower Body Bathing: with supervision;Sit to stand from chair;Sit to stand from bed;Unsupported;Other (comment) (min cueing to stay on task) ADL Goal: Lower Body Bathing - Progress: Goal set today Pt Will Transfer to Toilet: with supervision;Stand pivot transfer;with DME;3-in-1 ADL Goal: Toilet Transfer - Progress: Goal set today Pt Will Perform Toileting - Clothing Manipulation: with supervision;Sitting on 3-in-1 or toilet ADL Goal: Toileting - Clothing Manipulation - Progress: Goal set today Pt Will Perform Toileting - Hygiene: with supervision;Sit to stand from 3-in-1/toilet ADL Goal: Toileting - Hygiene - Progress: Goal set today Miscellaneous OT Goals Miscellaneous OT Goal #1: Pt will demonstrate sustained attention during 75% of ADL tasks with min verbal cueing. OT Goal: Miscellaneous Goal #1 - Progress: Goal set today Miscellaneous OT Goal #2: Pt will perform static sitting balance task EOB >5 min in prep for ADLs. OT Goal: Miscellaneous Goal #2 -  Progress: Goal set today  Visit Information  Last OT Received On: 04/06/12 Assistance Needed: +2 PT/OT Co-Evaluation/Treatment: Yes    Subjective Data  Subjective: "I need a cigarette"   Prior Functioning     Home Living Lives With: Alone Available Help at Discharge: Family;Friend(s);Available 24 hours/day (per pt report, however, pt is unreliable historian and no family available to provide information) Type of Home: Apartment Home Access: Stairs to enter [employer] of Steps: 2 Entrance Stairs-Rails: Right;Left;Can reach both Home Layout: One level Bathroom Shower/Tub: Walk-in shower;Door [employer] Toilet: Standard Bathroom Accessibility: Yes How Accessible: Accessible via walker Home Adaptive Equipment: Straight cane;Walker - rolling   Prior Function Level of Independence: Independent Able to Take Stairs?: Yes Driving: Yes Communication Communication: No difficulties Dominant Hand: Left         Vision/Perception     Cognition  Overall Cognitive Status: Impaired Area of Impairment: Attention;Following commands;Awareness of errors;Awareness of deficits;Safety/judgement Arousal/Alertness: Awake/alert Orientation Level: Disoriented to;Place;Time Behavior During Session: Restless Current Attention Level: Focused Following Commands: Follows one step commands inconsistently Safety/Judgement: Impulsive Safety/Judgement - Other Comments: Pt impulsively reaching for objects within reach while sitting EOB.  Awareness of Errors: Assistance required to identify errors made Awareness of Errors - Other Comments: Required assistance identify incorrect answers to cognitive questions. Awareness of Deficits: Pt unaware of balance deficits sitting EOB. Cognition - Other Comments: Pt overall very restless and fidgety.  Very distracted looking for her shoes and "doo rag" throughout session.    Extremity/Trunk Assessment Right Upper Extremity Assessment RUE  ROM/Strength/Tone: Unable to fully assess;WFL for tasks assessed;Due to impaired cognition Left Upper Extremity Assessment LUE ROM/Strength/Tone: Unable to fully assess;Due to impaired cognition;WFL for tasks assessed     Mobility Bed Mobility Bed Mobility: Supine to Sit;Sitting - Scoot to Edge of Bed;Sit to Supine Supine to Sit: 1: +2 Total assist;With rails Supine to Sit: Patient Percentage: 50% Sitting - Scoot to Edge of Bed: 2: Max assist Sit to Supine: 1: +2 Total assist Sit to Supine: Patient Percentage: 50% Details for Bed Mobility Assistance: Assist to initiate and complete bed mobility tasks as well as physicall assist to trunk and LEs for support in/out of bed. Transfers Transfers: Sit to Stand;Stand to Sit Sit to Stand: 1: +2 Total assist;From bed;With upper extremity assist Sit to Stand: Patient Percentage: 60% Stand to Sit: 1: +2 Total assist;To bed;With upper extremity assist Stand to Sit: Patient Percentage: 60% Details for Transfer Assistance: Assist to facilitate anterior weight shift and lift off from bed.       Shoulder Instructions     Exercise     Balance Balance Balance Assessed: Yes Static Sitting Balance Static Sitting - Balance Support: Bilateral upper extremity supported;Feet supported Static Sitting - Level of Assistance: 4: Min assist Static Sitting - Comment/# of Minutes: Pt sat EOB ~3 minutes. Assist due to posterior lean. Dynamic Sitting Balance Dynamic Sitting - Balance Support: During functional activity;Feet supported Dynamic Sitting - Level of Assistance: 3: Mod assist Dynamic Sitting Balance - Compensations: Pt with posterior lean while donning left sock. Static Standing Balance Static Standing - Balance Support: Bilateral upper extremity supported Static Standing - Level of Assistance: 1: +2 Total assist Static Standing - Comment/# of Minutes: Pt stood ~1 minutes with HHA x 2 with manual cueing through bil. posterior hips to facilitate  extension at hips.   End of Session OT - End of Session Equipment Utilized During Treatment: Gait belt Activity Tolerance: Patient tolerated treatment well Patient left: in bed;with call bell/phone within reach (with sitter) Nurse Communication: Mobility status  GO   04/06/2012 Cipriano Mile OTR/L Pager [phone] Office [phone]  Cipriano Mile 04/06/2012, 4:26 PM

## 2012-04-06 NOTE — Progress Notes (Signed)
Notified MD of blood pressure 175/97. Spoke to Dr. Cena Benton, he advised to continue to monitor blood pressure and to notify for SBP greater than 210 or DBP greater than 110. Will continue to monitor patient.

## 2012-04-06 NOTE — Progress Notes (Signed)
  Echocardiogram 2D Echocardiogram has been performed.  Meerab Maselli FRANCES 04/06/2012, 12:47 PM

## 2012-04-07 LAB — BASIC METABOLIC PANEL
Calcium: 9.3 mg/dL (ref 8.4–10.5)
GFR calc Af Amer: >90 mL/min (ref >90)
GFR calc non Af Amer: >90 mL/min (ref >90)
Glucose, Bld: 196 mg/dL — ABNORMAL HIGH (ref 70–99)
Potassium: 3.5 mEq/L (ref 3.5–5.1)
Sodium: 135 mEq/L (ref 135–145)

## 2012-04-07 LAB — CK: Total CK: 336 U/L — ABNORMAL HIGH (ref 7–177)

## 2012-04-07 LAB — LACTIC ACID, PLASMA: Lactic Acid, Venous: 0.9 mmol/L (ref 0.5–2.2)

## 2012-04-07 MED ORDER — HALOPERIDOL LACTATE 5 MG/ML IJ SOLN
INTRAMUSCULAR | Status: AC
  Administered 2012-04-07: 2 mg
  Filled 2012-04-07: qty 1

## 2012-04-07 MED ORDER — LORAZEPAM 2 MG/ML IJ SOLN
1.0000 mg | Freq: Once | INTRAMUSCULAR | Status: AC
  Administered 2012-04-07: 1 mg via INTRAVENOUS

## 2012-04-07 MED ORDER — LORAZEPAM 2 MG/ML IJ SOLN
0.5000 mg | INTRAMUSCULAR | Status: DC | PRN
  Administered 2012-04-07 – 2012-04-08 (×3): 0.5 mg via INTRAVENOUS
  Filled 2012-04-07 (×5): qty 1

## 2012-04-07 MED ORDER — LORAZEPAM 0.5 MG PO TABS
0.5000 mg | ORAL_TABLET | ORAL | Status: DC | PRN
  Administered 2012-04-08 – 2012-04-10 (×6): 0.5 mg via ORAL
  Filled 2012-04-07 (×6): qty 1

## 2012-04-07 MED ORDER — LORAZEPAM 2 MG/ML IJ SOLN
INTRAMUSCULAR | Status: AC
  Filled 2012-04-07: qty 1

## 2012-04-07 MED ORDER — HALOPERIDOL LACTATE 5 MG/ML IJ SOLN
2.0000 mg | Freq: Once | INTRAMUSCULAR | Status: AC
  Administered 2012-04-07: 2 mg via INTRAVENOUS

## 2012-04-07 MED ORDER — ASPIRIN 325 MG PO TABS
325.0000 mg | ORAL_TABLET | Freq: Every day | ORAL | Status: DC
  Administered 2012-04-07 – 2012-04-22 (×16): 325 mg via ORAL
  Filled 2012-04-07 (×17): qty 1

## 2012-04-07 MED ORDER — LORAZEPAM 2 MG/ML IJ SOLN
1.0000 mg | Freq: Once | INTRAMUSCULAR | Status: AC
  Administered 2012-04-07: 1 mg via INTRAVENOUS
  Filled 2012-04-07: qty 1
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
## 2012-04-07 NOTE — Progress Notes (Signed)
Patient was giving 1 mg Ativan IV. Will continue to monitor.

## 2012-04-07 NOTE — Progress Notes (Signed)
VASCULAR LAB PRELIMINARY  PRELIMINARY  PRELIMINARY  PRELIMINARY  Carotid duplex  completed.    Preliminary report:  Bilateral:  No evidence of hemodynamically significant internal carotid artery stenosis.   Vertebral artery flow is antegrade.      Ahlaya Ende, RVT 04/07/2012, 11:46 AM

## 2012-04-07 NOTE — Progress Notes (Signed)
Patient was trying to bite, pull out IV, and wanting to leave to smoke a cigarette this morning. Dr. Was called, Ativan was ordered, will continue to monitor.

## 2012-04-07 NOTE — Consult Note (Signed)
Reason for Consult: capacity Referring Physician: unknown  Tami Nolan is an 51 y.o. female.  HPI:  Tami Nolan is a 51 y.o. female who was transferred from the [facility] ED for further evaluation and treatment after being found in her home on the floor for an unknown amount of time. She states she was unable to get up due to weakness of her right side. She does not know how long she had been on the floor. Her neighbors reported to EMS that they had not seen her for 2 days. When EMS came to her home her home was in disarray, and she did not want to go to the hospital. At the ED IVC papers were taken out so that the patient would remain for evaluations and treatment. She was transferred to [facility] since Neurology and Psychiatry specialists are not available on the weekends at [facility].   Seen again today on the request of primary team as pt was threatening to leave AMA at one time. She knows why she is here. Willing to stay and get the treatment now here. Willing to follow treatment plan here.  Per pt she wants to reset now and requested that she should not be bothered right now. At times she gives conflicting hx as she reported that she was not seen by her MD but later admitted that she was seen by her MD today.   Past Medical History  Diagnosis Date  . Hepatitis     ETOH related   . ETOH abuse   . Hx of abnormal cervical Pap smear 1997  . Colon polyps 1997  . Genital warts   . Multiple sclerosis     Past Surgical History  Procedure Date  . Bilatetral tubal ligation     Family History  Problem Relation Age of Onset  . Coronary artery disease Brother   . Diabetes Brother   . Hypertension Brother   . Hypertension Mother   . Diabetes Mother   . Hypertension Sister   . Alcohol abuse      family history     Social History:  reports that she has been smoking Cigarettes.  She has a 15 pack-year smoking history. She has never used smokeless tobacco. She reports that  she does not drink alcohol or use illicit drugs.  Allergies: No Known Allergies  Medications: I have reviewed the patient's current medications.  Results for orders placed during the hospital encounter of 04/05/12 (from the past 48 hour(s))  HEMOGLOBIN A1C     Status: Normal   Collection Time   04/06/12  6:35 AM      Component Value Range Comment   Hemoglobin A1C 5.6  <5.7 %    Mean Plasma Glucose 114  <117 mg/dL   LIPID PANEL     Status: Abnormal   Collection Time   04/06/12  6:35 AM      Component Value Range Comment   Cholesterol 200  0 - 200 mg/dL    Triglycerides 73  <161 mg/dL    HDL 60  >09 mg/dL    Total CHOL/HDL Ratio 3.3      VLDL 15  0 - 40 mg/dL    LDL Cholesterol 604 (*) 0 - 99 mg/dL   CK     Status: Abnormal   Collection Time   04/06/12  6:35 AM      Component Value Range Comment   Total CK 683 (*) 7 - 177 U/L  MAGNESIUM     Status: Normal   Collection Time   04/06/12 10:54 AM      Component Value Range Comment   Magnesium 2.0  1.5 - 2.5 mg/dL   CK     Status: Abnormal   Collection Time   04/07/12  8:25 AM      Component Value Range Comment   Total CK 336 (*) 7 - 177 U/L   BASIC METABOLIC PANEL     Status: Abnormal   Collection Time   04/07/12  8:25 AM      Component Value Range Comment   Sodium 135  135 - 145 mEq/L    Potassium 3.5  3.5 - 5.1 mEq/L    Chloride 100  96 - 112 mEq/L    CO2 26  19 - 32 mEq/L    Glucose, Bld 196 (*) 70 - 99 mg/dL    BUN 12  6 - 23 mg/dL    Creatinine, Ser 1.61 (*) 0.50 - 1.10 mg/dL    Calcium 9.3  8.4 - 09.6 mg/dL    GFR calc non Af Amer >90  >90 mL/min    GFR calc Af Amer >90  >90 mL/min   LACTIC ACID, PLASMA     Status: Normal   Collection Time   04/07/12 11:15 AM      Component Value Range Comment   Lactic Acid, Venous 0.9  0.5 - 2.2 mmol/L     Dg Chest 2 View  04/05/2012  *RADIOLOGY REPORT*  Clinical Data: Stroke.  Right-sided weakness.  CHEST - 2 VIEW  Comparison: 04/05/2012 at 1315 hours.  Findings: Shallow  inspiration.  Heart size and pulmonary vascularity are normal for inspiratory effort.  No focal airspace consolidation in the lungs.  No blunting of costophrenic angles. No pneumothorax.  Postoperative changes in the thoracolumbar spine. No significant change since previous study.  IMPRESSION: No evidence of active pulmonary disease.   Original Report Authenticated By: Burman Nieves, M.D.    Dg Hip Complete Right  04/05/2012  *RADIOLOGY REPORT*  Clinical Data: Altered mental status.  Bruise on right hip.  RIGHT HIP - COMPLETE 2+ VIEW  Comparison: No priors.  Findings: AP view of the pelvis and AP and lateral views of the right hip demonstrate no acute fracture, subluxation, dislocation, joint or soft tissue abnormality.  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  IMPRESSION: 1.  No acute radiographic abnormality of the bony pelvis or the right hip. 2.  Mild bilateral hip joint osteoarthritis.   Original Report Authenticated By: Trudie Reed, M.D.    Dg Knee 1-2 Views Left  04/05/2012  *RADIOLOGY REPORT*  Clinical Data: Pain.  Possible trauma.  LEFT KNEE - 1-2 VIEW  Comparison: 12/17/2004  Findings: AP and cross-table lateral views.  No acute fracture or dislocation.  No joint effusion.    Probable calcifications within the patellar tendon.  Present on the prior.  IMPRESSION: No acute osseous abnormality.   Original Report Authenticated By: Jeronimo Greaves, M.D.    Dg Knee 1-2 Views Right  04/05/2012  *RADIOLOGY REPORT*  Clinical Data: Altered mental status.  Bruise on right hip.  RIGHT KNEE - 1-2 VIEW  Comparison: No priors.  Findings: AP and lateral views of the right knee demonstrate no acute fracture, subluxation, dislocation, joint or soft tissue abnormality.  Joint space narrowing and subchondral sclerosis is noted in the knee joint, most pronounced in the lateral compartment, compatible with osteoarthritis.  IMPRESSION: 1.  No acute radiographic  abnormality of the right knee.    Original Report Authenticated By: Trudie Reed, M.D.    Mr Laqueta Jean Contrast  04/06/2012  *RADIOLOGY REPORT*  Clinical Data: Restricted diffusion left internal capsule on noncontrast exam 04/05/2012.  Rhabdomyolysis after being found down for unknown period of time.  Right-sided weakness.  MRI HEAD WITH CONTRAST  Technique:  Multiplanar, multiecho pulse sequences of the brain and surrounding structures were obtained according to standard protocol with intravenous contrast  Contrast: 15mL MULTIHANCE GADOBENATE DIMEGLUMINE 529 MG/ML IV SOLN  Comparison: 04/05/2012 MR  Findings: The patient had difficulty remaining motionless for the study.  Images are suboptimal.  Small or subtle lesions could be overlooked.  Following the administration of contrast, there is no abnormal intracranial enhancement. Special attention is directed to the posterior limb internal capsule as well as the periventricular white matter.  IMPRESSION: The area of concern involving posterior limb internal capsule on the left does not enhance.  Favor non-hemorrhagic acute infarct.   Original Report Authenticated By: Davonna Belling, M.D.    Mr Mra Head/brain Wo Cm  04/06/2012  *RADIOLOGY REPORT*  Clinical Data: Right-sided weakness.  Suspected acute left hemisphere CVA.  MRA HEAD WITHOUT CONTRAST  Technique: Angiographic images of the Circle of Willis were obtained using MRA technique without intravenous contrast.  Comparison: Prior MRI and CT exams from 11/01 and 04/06/2012.  Findings:  Motion degraded examination.  Reduced image quality.  Mildly irregular but nonstenotic internal carotid artery cavernous segments bilaterally.  Hypoplastic basilar artery likely relates to fetal origin both PCAs.  Right vertebral dominant/sole contributor basilar.  Hypoplastic or severely diseased proximal left anterior cerebral artery.  Mildly irregular but nonstenotic proximal MCA trunks. Both anterior cerebrals fill from the right.  Moderately irregular distal  MCA and PCA branches consistent with intracranial atherosclerotic change.  No visible intracranial aneurysm.  IMPRESSION: No definite proximal flow reducing lesion of the carotid or basilar arteries.  Left A1 ACA absent.  No visible proximal left MCA stenosis to account for the acute left internal capsule infarct.   Original Report Authenticated By: Davonna Belling, M.D.     ROS  Blood pressure 152/102, pulse 99, temperature 98 F (36.7 C), temperature source Oral, resp. rate 20, height 5\' 7"  (1.702 m), weight 79.379 kg (175 lb), SpO2 100.00%. Physical Exam    MSE:   Appearance: on bed  Eye Contact:: Good  Speech: normal  Volume: Normal  Mood: ok  Affect: ristricted  Thought Process: organized  Orientation: Full  Thought Content: NO AVH  Suicidal Thoughts: No  Homicidal Thoughts: no  Memory: Recent; Poor  Judgement: limited  Insight: limited  Psychomotor Activity: Normal  Concentration: Fair  Recall: Fair  Akathisia: No  Assessment:   AXIS I: Delirium nos (resolving)  AXIS II: Deferred  AXIS III: see emdical hx ? ?  ? ?  ?  ? ?  ?  ?  ?  AXIS IV: disabled  AXIS V: 45  ?  Treatment Plan/Recommendations:  1. Pt is willing to stay and understand reasons for treatment at this time. If she wants to leave AMA, I will recomend to hold her until she will be seen by Psy again. I will also recommend that she will be given details of reason for staying here if she will request to leave AMA    2. We can assess her again for capacity incase she refuse treatment and primary team has concerns about her decision making capacity   3. Will sign off.  Thanks for consult.    Wonda Cerise 04/07/2012, 3:57 PM

## 2012-04-07 NOTE — Progress Notes (Signed)
[facility] PROGRESS NOTE  DARYAN CAGLEY ZOX:[id number] DOB: Feb 23, 1961 DOA: 04/05/2012 PCP: [name], MD  Assessment/Plan: 1. Stroke - Consulted Neuro and they will be managing and making further recommendations - We are to continue aspirin 325 mg po daily - statin was held on admission given patients rhabdomyolysis - Cardiac MRI planned for further evaluation - smoking cessation counseling  2. Hypokalemia - Magnesium levels within normal limits - Improved after oral repletion.  3. Rhabdomyolysis - given history probably related to being on the floor for a prolonged period of time - CPK trending down - recheck level next am. - continue IVF's  4. Nicotine Abuse - Continue nicotine patch - recommend cessation  I have reconsulted psychiatry as patient seems anxious again and is telling staff that she wants to go home.  I have discussed with patient risks of not completing work up and initially she was saying ok but currently wants to leave the hospital.  Should psychiatry deem patient medically competent would allowing patient to leave AMA.   Code Status: full Family Communication: No family at bedside Disposition Plan: Pending further work up and recommendations   Consultants:  Neurology  Psychiatry  Procedures:  MRI/CT of head  Antibiotics:  None  HPI/Subjective: No acute issues overnight. Pt reports feeling anxious.    Objective: Filed Vitals:   04/06/12 2106 04/07/12 0202 04/07/12 0659 04/07/12 0936  BP: 159/102 160/100 144/98 139/94  Pulse: 93 93 102 98  Temp: 97.9 F (36.6 C) 98 F (36.7 C) 98.2 F (36.8 C) 99.6 F (37.6 C)  TempSrc: Oral Oral Oral Oral  Resp: 17 16 18 16   Height:      Weight:      SpO2: 96% 100% 99% 100%    Intake/Output Summary (Last 24 hours) at 04/07/12 1238 Last data filed at 04/07/12 0800  Gross per 24 hour  Intake    240 ml  Output      0 ml  Net    240 ml   Filed Weights   04/05/12 1206  Weight:  79.379 kg (175 lb)    Exam:   General:  Pt in NAD, Alert and Awake  Cardiovascular: RRR, No mrg  Respiratory: CTA BL, no wheezes  Abdomen: soft, NT, ND  Neuro: Pt has right upper and lower extremity weakness when compared to the left, answers questions appropriately  Data Reviewed: Basic Metabolic Panel:  Lab 04/07/12 8119 04/06/12 1054 04/05/12 1319  NA 135 -- 137  K 3.5 -- 3.1*  CL 100 -- 98  CO2 26 -- 25  GLUCOSE 196* -- 112*  BUN 12 -- 14  CREATININE 0.44* -- 0.41*  CALCIUM 9.3 -- 10.1  MG -- 2.0 --  PHOS -- -- --   Liver Function Tests:  Lab 04/05/12 1319  AST 51*  ALT 25  ALKPHOS 67  BILITOT 0.5  PROT 8.3  ALBUMIN 4.0   No results found for this basename: LIPASE:5,AMYLASE:5 in the last 168 hours No results found for this basename: AMMONIA:5 in the last 168 hours CBC:  Lab 04/05/12 1319  WBC 8.1  NEUTROABS 4.9  HGB 15.4*  HCT 41.8  MCV 78.0  PLT 290   Cardiac Enzymes:  Lab 04/07/12 0825 04/06/12 0635 04/05/12 1319  CKTOTAL 336* 683* 1132*  CKMB -- -- --  CKMBINDEX -- -- --  TROPONINI -- -- <0.30   BNP (last 3 results) No results found for this basename: PROBNP:3 in the last 8760 hours CBG: No results found  for this basename: GLUCAP:5 in the last 168 hours  No results found for this or any previous visit (from the past 240 hour(s)).   Studies: Dg Chest 1 View  04/05/2012  *RADIOLOGY REPORT*  Clinical Data: 51 year old female altered mental status and confusion.  CHEST - 1 VIEW  Comparison: 03/27/2012 and earlier.  Findings: AP semi upright portable view 1316 hours.  The patient is rotated to the right.  Stable or slightly improved lung volumes. Stable cardiomegaly and mediastinal contours.  Allowing for portable technique, the lungs are clear.  No pneumothorax or effusion.  Lower thoracic and lumbar spinal hardware partially visible.  IMPRESSION: No acute cardiopulmonary abnormality.   Original Report Authenticated By: Erskine Speed, M.D.     Dg Chest 2 View  04/05/2012  *RADIOLOGY REPORT*  Clinical Data: Stroke.  Right-sided weakness.  CHEST - 2 VIEW  Comparison: 04/05/2012 at 1315 hours.  Findings: Shallow inspiration.  Heart size and pulmonary vascularity are normal for inspiratory effort.  No focal airspace consolidation in the lungs.  No blunting of costophrenic angles. No pneumothorax.  Postoperative changes in the thoracolumbar spine. No significant change since previous study.  IMPRESSION: No evidence of active pulmonary disease.   Original Report Authenticated By: Burman Nieves, M.D.    Dg Hip Complete Right  04/05/2012  *RADIOLOGY REPORT*  Clinical Data: Altered mental status.  Bruise on right hip.  RIGHT HIP - COMPLETE 2+ VIEW  Comparison: No priors.  Findings: AP view of the pelvis and AP and lateral views of the right hip demonstrate no acute fracture, subluxation, dislocation, joint or soft tissue abnormality.  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  IMPRESSION: 1.  No acute radiographic abnormality of the bony pelvis or the right hip. 2.  Mild bilateral hip joint osteoarthritis.   Original Report Authenticated By: Trudie Reed, M.D.    Dg Knee 1-2 Views Left  04/05/2012  *RADIOLOGY REPORT*  Clinical Data: Pain.  Possible trauma.  LEFT KNEE - 1-2 VIEW  Comparison: 12/17/2004  Findings: AP and cross-table lateral views.  No acute fracture or dislocation.  No joint effusion.    Probable calcifications within the patellar tendon.  Present on the prior.  IMPRESSION: No acute osseous abnormality.   Original Report Authenticated By: Jeronimo Greaves, M.D.    Dg Knee 1-2 Views Right  04/05/2012  *RADIOLOGY REPORT*  Clinical Data: Altered mental status.  Bruise on right hip.  RIGHT KNEE - 1-2 VIEW  Comparison: No priors.  Findings: AP and lateral views of the right knee demonstrate no acute fracture, subluxation, dislocation, joint or soft tissue abnormality.  Joint space narrowing and subchondral sclerosis is  noted in the knee joint, most pronounced in the lateral compartment, compatible with osteoarthritis.  IMPRESSION: 1.  No acute radiographic abnormality of the right knee.   Original Report Authenticated By: Trudie Reed, M.D.    Ct Head Wo Contrast  04/05/2012  *RADIOLOGY REPORT*  Clinical Data: 51 year old female with altered mental status, confusion, right side weakness.  CT HEAD WITHOUT CONTRAST  Technique:  Contiguous axial images were obtained from the base of the skull through the vertex without contrast.  Comparison: 03/27/2012 and earlier.  Findings: Visualized paranasal sinuses and mastoids are clear. Visualized orbits and scalp soft tissues are within normal limits. No acute osseous abnormality identified.  Calcified atherosclerosis at the skull base.  Patchy confluent bilateral cerebral white matter hypodensity is stable from earlier this month.  This tracks to the deep white matter capsules  as before.  Deep gray matter nuclei appear relatively spared.  The no ventriculomegaly. No midline shift, mass effect, or evidence of mass lesion.  No acute intracranial hemorrhage identified.  No evidence of cortically based acute infarction identified.  No suspicious intracranial vascular hyperdensity.  IMPRESSION: Stable cerebral white matter changes, favor small vessel disease related.   Original Report Authenticated By: Erskine Speed, M.D.    Mr Brain Wo Contrast  04/05/2012  *RADIOLOGY REPORT*  Clinical Data: Altered mental status.  Right-sided weakness. History of multiple sclerosis.  MRI HEAD WITHOUT CONTRAST  Technique:  Multiplanar, multiecho pulse sequences of the brain and surrounding structures were obtained according to standard protocol without intravenous contrast.  Comparison: 04/05/2012 CT.  06/11/2007 MR.  Findings: Motion degraded exam.  Focal area of restricted motion posterior aspect of the posterior limb of the left internal capsule is most suggestive of a non hemorrhagic acute infarct  rather than restricted motion related to an active MS plaque.  Marked white matter type changes confluent periventricular and subcortical region and in the central pons consistent with the patient's history of multiple sclerosis.  Small vessel disease type changes could contribute to some white matter type changes.  Global atrophy without hydrocephalus.  No intracranial mass lesion detected on this unenhanced motion degraded exam.  Diminutive size left vertebral artery which may be congenitally small in addition to atherosclerotic type changes.  The small size of the vertebral arteries and basilar artery may be explained partially by the fetal type portion of the posterior cerebral arteries.  Major intracranial vascular structures are patent.  IMPRESSION: Motion degraded exam.  Focal area of restricted motion posterior aspect of the posterior limb of the left internal capsule is more suggestive of a non hemorrhagic acute infarct rather than restricted motion related to an active MS plaque.  Marked white matter type changes confluent periventricular and subcortical region and in the central pons consistent with the patient's history of multiple sclerosis.  Small vessel disease type changes could contribute to some white matter type changes.  Global atrophy without hydrocephalus.  Critical Value/emergent results were called by telephone at the time of interpretation on 04/05/2012 at 3:15 p.m. to Dr. Lynelle Doctor.,, who verbally acknowledged these results.   Original Report Authenticated By: Lacy Duverney, M.D.    Mr Laqueta Jean Contrast  04/06/2012  *RADIOLOGY REPORT*  Clinical Data: Restricted diffusion left internal capsule on noncontrast exam 04/05/2012.  Rhabdomyolysis after being found down for unknown period of time.  Right-sided weakness.  MRI HEAD WITH CONTRAST  Technique:  Multiplanar, multiecho pulse sequences of the brain and surrounding structures were obtained according to standard protocol with intravenous  contrast  Contrast: 15mL MULTIHANCE GADOBENATE DIMEGLUMINE 529 MG/ML IV SOLN  Comparison: 04/05/2012 MR  Findings: The patient had difficulty remaining motionless for the study.  Images are suboptimal.  Small or subtle lesions could be overlooked.  Following the administration of contrast, there is no abnormal intracranial enhancement. Special attention is directed to the posterior limb internal capsule as well as the periventricular white matter.  IMPRESSION: The area of concern involving posterior limb internal capsule on the left does not enhance.  Favor non-hemorrhagic acute infarct.   Original Report Authenticated By: Davonna Belling, M.D.    Mr Mra Head/brain Wo Cm  04/06/2012  *RADIOLOGY REPORT*  Clinical Data: Right-sided weakness.  Suspected acute left hemisphere CVA.  MRA HEAD WITHOUT CONTRAST  Technique: Angiographic images of the Circle of Willis were obtained using MRA technique without intravenous contrast.  Comparison: Prior MRI and CT exams from 11/01 and 04/06/2012.  Findings:  Motion degraded examination.  Reduced image quality.  Mildly irregular but nonstenotic internal carotid artery cavernous segments bilaterally.  Hypoplastic basilar artery likely relates to fetal origin both PCAs.  Right vertebral dominant/sole contributor basilar.  Hypoplastic or severely diseased proximal left anterior cerebral artery.  Mildly irregular but nonstenotic proximal MCA trunks. Both anterior cerebrals fill from the right.  Moderately irregular distal MCA and PCA branches consistent with intracranial atherosclerotic change.  No visible intracranial aneurysm.  IMPRESSION: No definite proximal flow reducing lesion of the carotid or basilar arteries.  Left A1 ACA absent.  No visible proximal left MCA stenosis to account for the acute left internal capsule infarct.   Original Report Authenticated By: Davonna Belling, M.D.     Scheduled Meds:    . enoxaparin  40 mg Subcutaneous Q24H  . [COMPLETED] LORazepam  1 mg  Intravenous Once  . nicotine  21 mg Transdermal Daily  . [COMPLETED] potassium chloride SA  40 mEq Oral Once  . [DISCONTINUED] LORazepam       Continuous Infusions:    . sodium chloride 100 mL/hr at 04/06/12 1057    Principal Problem:  *Rhabdomyolysis Active Problems:  Stroke  Hypokalemia  Nicotine abuse    Time spent: > 35 minutes    Penny Pia  [facility] Pager [phone]. If 8PM-8AM, please contact night-coverage at [email or web address], password [facility] 04/07/2012, 12:38 PM  LOS: 2 days

## 2012-04-07 NOTE — Progress Notes (Signed)
Patient still agitated, Dr. was called again, Haldol ordered 2 mg IV given. Will continue to monitor.

## 2012-04-07 NOTE — Progress Notes (Signed)
Patient is becoming very agitated, Ativan .5 mg was giving 30 min. Ago. Paged Dr. To get more meds. For patient to come down.

## 2012-04-08 ENCOUNTER — Inpatient Hospital Stay (HOSPITAL_COMMUNITY): Payer: Medicare Other

## 2012-04-08 DIAGNOSIS — I6789 Other cerebrovascular disease: Secondary | ICD-10-CM

## 2012-04-08 LAB — SEDIMENTATION RATE: Sed Rate: 9 mm/hr (ref 0–22)

## 2012-04-08 LAB — CK: Total CK: 201 U/L — ABNORMAL HIGH (ref 7–177)

## 2012-04-08 LAB — C3 COMPLEMENT: C3 Complement: 157 mg/dL (ref 90–180)

## 2012-04-08 LAB — HIV ANTIBODY (ROUTINE TESTING W REFLEX): HIV: NONREACTIVE

## 2012-04-08 MED ORDER — HALOPERIDOL LACTATE 5 MG/ML IJ SOLN
2.0000 mg | Freq: Four times a day (QID) | INTRAMUSCULAR | Status: DC | PRN
  Administered 2012-04-08: 2 mg via INTRAVENOUS
  Filled 2012-04-08: qty 1

## 2012-04-08 NOTE — Progress Notes (Signed)
Physical Therapy Treatment Patient Details Name: Tami Nolan MRN: [id number] DOB: 01-13-1961 Today's Date: 04/08/2012 Time: 4098-1191 PT Time Calculation (min): 36 min  PT Assessment / Plan / Recommendation Comments on Treatment Session  Pt restless and wanting to leave and smoke a cigarette. Poor attention to task throughout session. RN entered towards end of session and reported she had Ativan to prepare to go down for MRI--did not appear lethargic, however perhaps this impacted her ability to focus.     Follow Up Recommendations  Post acute inpatient     Does the patient have the potential to tolerate intense rehabilitation  No, Recommend SNF  Barriers to Discharge        Equipment Recommendations  3 in 1 bedside comode;Rolling walker with 5" wheels    Recommendations for Other Services    Frequency Min 3X/week   Plan Discharge plan remains appropriate;Frequency remains appropriate    Precautions / Restrictions Precautions Precautions: Fall   Pertinent Vitals/Pain Denied pain    Mobility  Bed Mobility Bed Mobility: Sitting - Scoot to Edge of Bed;Sit to Supine;Scooting to HOB Supine to Sit: Other (comment) (pt long sitting on arrival) Sitting - Scoot to Edge of Bed: 3: Mod assist (from long-sitting to Lt EOB) Sit to Supine: 1: +2 Total assist;HOB flat Sit to Supine: Patient Percentage: 40% Scooting to HOB: 1: +2 Total assist Scooting to [facility]: Patient Percentage: 0% Details for Bed Mobility Assistance: Pt initially unaware that RLE is weak and was leaving it behind (up on the bed as she moved into sitting at EOB); admitted weakness when pointed out to her that she needed help moving RLE and later in session able to verbalize again that it was weak; end of session pt not wanting to lie down and required incr assist to move to supine Transfers Transfers: Sit to Stand;Stand to Sit;Lateral/Scoot Transfers Sit to Stand: 1: +2 Total assist;From elevated surface;With upper  extremity assist;From bed Sit to Stand: Patient Percentage: 50% Stand to Sit: 1: +2 Total assist;Without upper extremity assist;To elevated surface;To bed Stand to Sit: Patient Percentage: 60% Lateral/Scoot Transfers: 1: +2 Total assist;Other (comment) (along EOB to [facility] (to her Lt)) Lateral Transfers: Patient Percentage: 40% Details for Transfer Assistance: stood x 1 with bed at lowest height and pt unable to achieve full standing (trunk flexed, hips and knees flexed); stood x2 with bed elevated with pt's hands on PT's shoulders seated in front of her and 2nd PT on her Rt side (somewhat more erect, but still not fully standing); stood 20 sec at most; lateral scoot pt assisted with forward weight shift and some shifting of hips to her Lt with used of Lt leg Ambulation/Gait Ambulation/Gait Assistance: Not tested (comment) Modified Rankin (Stroke Patients Only) Pre-Morbid Rankin Score: No symptoms Modified Rankin: Severe disability    Exercises     PT Diagnosis:    PT Problem List:   PT Treatment Interventions:     PT Goals Acute Rehab PT Goals Pt will go Supine/Side to Sit: with supervision PT Goal: Supine/Side to Sit - Progress: Progressing toward goal Pt will go Sit to Supine/Side: with supervision PT Goal: Sit to Supine/Side - Progress: Progressing toward goal Pt will go Sit to Stand: with min assist PT Goal: Sit to Stand - Progress: Progressing toward goal Pt will go Stand to Sit: with min assist PT Goal: Stand to Sit - Progress: Progressing toward goal Pt will Transfer Bed to Chair/Chair to Bed: with min assist PT Transfer Goal: Bed  to Chair/Chair to Bed - Progress: Progressing toward goal  Visit Information  Last PT Received On: 04/08/12 Assistance Needed: +2    Subjective Data  Subjective: "I need to get out of this sh** place"   Cognition  Overall Cognitive Status: Impaired Area of Impairment: Attention;Memory;Following commands;Safety/judgement;Awareness of  errors;Awareness of deficits Arousal/Alertness: Awake/alert Orientation Level: Disoriented to;Place;Time;Situation Behavior During Session: Restless Current Attention Level: Sustained Attention - Other Comments: up to 15 seconds--barely more than focused Memory Deficits: does not recall that she's had a stroke and does not recall 5 minutes after discussed Following Commands: Follows one step commands inconsistently Safety/Judgement: Impulsive;Decreased safety judgement for tasks assessed;Decreased awareness of need for assistance Safety/Judgement - Other Comments: attempting to climb out of bed on arrival; sitter present Awareness of Errors: Assistance required to identify errors made;Assistance required to correct errors made Cognition - Other Comments: Perseverates on words and motor tasks;     Balance  Static Sitting Balance Static Sitting - Balance Support: Bilateral upper extremity supported;Feet supported Static Sitting - Level of Assistance: 3: Mod assist (up to mod assist) Static Sitting - Comment/# of Minutes: total of 25 minutes Dynamic Sitting Balance Dynamic Sitting - Balance Support: Left upper extremity supported;Feet supported Dynamic Sitting - Level of Assistance: 3: Mod assist Dynamic Sitting Balance - Compensations: guided/hand over hand reaching; pt with alternating between posterior lean and controlled excessive forward hip and trunk flexion Dynamic Sitting - Comments: facilitated trunk rotation Rt and Lt and trunk extension with attempts to move pelvis towards neutral (from posterior tilt); pt with very stiff/rigid trunk Static Standing Balance Static Standing - Balance Support: Bilateral upper extremity supported Static Standing - Level of Assistance: 1: +2 Total assist  End of Session PT - End of Session Equipment Utilized During Treatment: Gait belt Activity Tolerance: Patient tolerated treatment well Patient left: in bed;with call bell/phone within reach;Other  (comment) (with sitter) Nurse Communication: Mobility status   GP     Tami Nolan 04/08/2012, 4:40 PM Pager [phone]

## 2012-04-08 NOTE — Progress Notes (Signed)
Speech Pathology  Noted orders for speech pathology including a MBS received on 11/1. It does not appear based on chart review, that these orders were received on 11/1 given no SLP notes located in chart. Patient passed the RN stroke swallow screen and per RN, has been eating well without concerns of difficulty. She is currently NPO for cardiac MRI. Discussed orders with Dr. Cena Benton who stated that swallow or cognitive-linguistic evaluations were not needed at this time. Orders's discontinued. Please reconsult if needed.  Ferdinand Lango MA, CCC-SLP [phone]

## 2012-04-08 NOTE — Progress Notes (Signed)
CSW had brief conversation with patient today- she was not oriented to year or location and unable to tell me why she is hospitalized- she admits to drinking liquor daily- unable to stay on track with conversation- does acknoweldge her brother- Fayrene Fearing as a source of support and is ok with me contacting him which I have attempted to do today- no answer. I also rec'd a call today from [facility] worker- Melanie [phone], ext [phone]. I returned her call and left a message to which she has not returned the call- per morning report, patient was brought to hospital via EMS with an IVC.  CSW will follow up tomorrow to further assess and determine d/c needs for this patient. Reece Levy, MSW, Theresia Majors [phone]

## 2012-04-08 NOTE — Progress Notes (Signed)
[facility] PROGRESS NOTE  Tami Nolan ION:[id number] DOB: [birth year] DOA: 04/05/2012 PCP: [name], MD  Assessment/Plan: 1. Stroke - Consulted Neuro and they will be managing and making further recommendations - We are to continue aspirin 325 mg po daily - statin was held on admission given patients rhabdomyolysis - Cardiac MRI planned for further evaluation and currently pending - smoking cessation counseling - Disposition will need to be addressed as patient at this point not felt to be safe to transition home.  Nursing reports that patient is debilitated needing 2 person assists to transfer out of bed.  2. Hypokalemia - Magnesium levels within normal limits - Improved after oral repletion.  3. Rhabdomyolysis - given history probably related to being on the floor for a prolonged period of time - CPK trending down with IVF hydration. - recheck level next am. - continue IVF's  4. Nicotine Abuse - Continue nicotine patch - recommend cessation  Psychiatry has evaluated patient and patient is agreeable to continue with work up.    Code Status: full Family Communication: Spoke with patient and brother Rhea Bleacher [phone]) Disposition Plan: Pending further work up and recommendations   Consultants:  Neurology  Psychiatry  Procedures:  MRI/CT of head  Antibiotics:  None  HPI/Subjective: No new complaints reported.  Brother reports that he would like to become more involved in patient's care and is inquiring to speak to Child psychotherapist.  Objective: Filed Vitals:   04/08/12 0118 04/08/12 0646 04/08/12 1005 04/08/12 1105  BP: 153/87 145/95 148/101 170/80  Pulse: 97 110 115   Temp: 98.4 F (36.9 C) 98.4 F (36.9 C) 98.5 F (36.9 C)   TempSrc: Axillary Oral Oral   Resp: 18 18 20    Height:      Weight:      SpO2: 99% 99% 100%     Intake/Output Summary (Last 24 hours) at 04/08/12 1132 Last data filed at 04/07/12 1200  Gross per 24 hour    Intake    120 ml  Output      0 ml  Net    120 ml   Filed Weights   04/05/12 1206  Weight: 79.379 kg (175 lb)    Exam:   General:  Pt in NAD, Alert and Awake  Cardiovascular: RRR, No mrg  Respiratory: CTA BL, no wheezes  Abdomen: soft, NT, ND  Neuro: Pt has right upper and lower extremity weakness when compared to the left, answers questions appropriately  Data Reviewed: Basic Metabolic Panel:  Lab 04/07/12 3664 04/06/12 1054 04/05/12 1319  NA 135 -- 137  K 3.5 -- 3.1*  CL 100 -- 98  CO2 26 -- 25  GLUCOSE 196* -- 112*  BUN 12 -- 14  CREATININE 0.44* -- 0.41*  CALCIUM 9.3 -- 10.1  MG -- 2.0 --  PHOS -- -- --   Liver Function Tests:  Lab 04/05/12 1319  AST 51*  ALT 25  ALKPHOS 67  BILITOT 0.5  PROT 8.3  ALBUMIN 4.0   No results found for this basename: LIPASE:5,AMYLASE:5 in the last 168 hours No results found for this basename: AMMONIA:5 in the last 168 hours CBC:  Lab 04/05/12 1319  WBC 8.1  NEUTROABS 4.9  HGB 15.4*  HCT 41.8  MCV 78.0  PLT 290   Cardiac Enzymes:  Lab 04/08/12 0550 04/07/12 0825 04/06/12 0635 04/05/12 1319  CKTOTAL 201* 336* 683* 1132*  CKMB -- -- -- --  CKMBINDEX -- -- -- --  TROPONINI -- -- -- <  0.30   BNP (last 3 results) No results found for this basename: PROBNP:3 in the last 8760 hours CBG: No results found for this basename: GLUCAP:5 in the last 168 hours  No results found for this or any previous visit (from the past 240 hour(s)).   Studies: Mr Laqueta Jean Contrast  04/06/2012  *RADIOLOGY REPORT*  Clinical Data: Restricted diffusion left internal capsule on noncontrast exam 04/05/2012.  Rhabdomyolysis after being found down for unknown period of time.  Right-sided weakness.  MRI HEAD WITH CONTRAST  Technique:  Multiplanar, multiecho pulse sequences of the brain and surrounding structures were obtained according to standard protocol with intravenous contrast  Contrast: 15mL MULTIHANCE GADOBENATE DIMEGLUMINE 529 MG/ML IV  SOLN  Comparison: 04/05/2012 MR  Findings: The patient had difficulty remaining motionless for the study.  Images are suboptimal.  Small or subtle lesions could be overlooked.  Following the administration of contrast, there is no abnormal intracranial enhancement. Special attention is directed to the posterior limb internal capsule as well as the periventricular white matter.  IMPRESSION: The area of concern involving posterior limb internal capsule on the left does not enhance.  Favor non-hemorrhagic acute infarct.   Original Report Authenticated By: Davonna Belling, M.D.    Mr Mra Head/brain Wo Cm  04/06/2012  *RADIOLOGY REPORT*  Clinical Data: Right-sided weakness.  Suspected acute left hemisphere CVA.  MRA HEAD WITHOUT CONTRAST  Technique: Angiographic images of the Circle of Willis were obtained using MRA technique without intravenous contrast.  Comparison: Prior MRI and CT exams from 11/01 and 04/06/2012.  Findings:  Motion degraded examination.  Reduced image quality.  Mildly irregular but nonstenotic internal carotid artery cavernous segments bilaterally.  Hypoplastic basilar artery likely relates to fetal origin both PCAs.  Right vertebral dominant/sole contributor basilar.  Hypoplastic or severely diseased proximal left anterior cerebral artery.  Mildly irregular but nonstenotic proximal MCA trunks. Both anterior cerebrals fill from the right.  Moderately irregular distal MCA and PCA branches consistent with intracranial atherosclerotic change.  No visible intracranial aneurysm.  IMPRESSION: No definite proximal flow reducing lesion of the carotid or basilar arteries.  Left A1 ACA absent.  No visible proximal left MCA stenosis to account for the acute left internal capsule infarct.   Original Report Authenticated By: Davonna Belling, M.D.     Scheduled Meds:    . aspirin  325 mg Oral Daily  . enoxaparin  40 mg Subcutaneous Q24H  . [COMPLETED] haloperidol lactate      . [COMPLETED] haloperidol lactate   2 mg Intravenous Once  . [COMPLETED] LORazepam  1 mg Intravenous Once  . nicotine  21 mg Transdermal Daily  . [DISCONTINUED] LORazepam       Continuous Infusions:    . sodium chloride 100 mL/hr at 04/06/12 1057    Principal Problem:  *Rhabdomyolysis Active Problems:  Stroke  Hypokalemia  Nicotine abuse    Time spent: > 35 minutes    Penny Pia  [facility] Pager [phone]. If 8PM-8AM, please contact night-coverage at [email or web address], password [facility] 04/08/2012, 11:32 AM  LOS: 3 days

## 2012-04-08 NOTE — Progress Notes (Signed)
[facility]  Patient Status: Active (receiving services up to time of hospitalization)  [facility] is providing the following services: RN, PT, OT and MSW  If patient discharges after hours, please call [phone].   [facility] 04/08/2012, 11:15 AM

## 2012-04-08 NOTE — Progress Notes (Signed)
Clinical Social Work Department CLINICAL SOCIAL WORK PSYCHIATRY SERVICE LINE ASSESSMENT 04/08/2012  Patient:  Tami Nolan  Account:  [id number]  Admit Date:  04/05/2012  Clinical Social Worker:  Ronda Fairly, CLINICAL SOCIAL WORKER  Date/Time:  04/08/2012 02:20 PM Referred by:  Physician  Date referred:  04/06/2012 Reason for Referral  Behavioral Health Issues   Presenting Symptoms/Problems (In the person's/family's own words):   Patient reports no issues and minimallyresponds to questioning.   Abuse/Neglect/Trauma History (check all that apply)  Denies history   Abuse/Neglect/Trauma Comments:   Psychiatric History (check all that apply)  Denies history   Psychiatric medications:  Current Mental Health Hospitalizations/Previous Mental Health History:   Current provider:   Place and Date:   Current Medications:   Previous Impatient Admission/Date/Reason:   Emotional Health / Current Symptoms    Suicide/Self Harm  None reported   Suicide attempt in the past:   Other harmful behavior:   Psychotic/Dissociative Symptoms  None reported   Other Psychotic/Dissociative Symptoms:    Attention/Behavioral Symptoms  Inattentive  Withdrawn   Other Attention / Behavioral Symptoms:   Patient presents with flat affect and appears to be uninterested in tlking about herself.    Cognitive Impairment  Orientation - Situation   Other Cognitive Impairment:    Mood and Adjustment  Flat    Stress, Anxiety, Trauma, Any Recent Loss/Stressor  Other - See comment   Anxiety (frequency):   Phobia (specify):   Compulsive behavior (specify):   Obsessive behavior (specify):   Other:   Patient lives alone and was recently found down on the floor, but had apparently continued to smoke cigarettes for several days until she was found.   Substance Abuse/Use  Current substance use   SBIRT completed (please refer for detailed history):  Y  Self-reported substance use:   Patient  reports she "uses THC once every blue moon and drinks alcohol whenever she chooses to get and when ever she chooses to go get it."   Urinary Drug Screen Completed:  Y Alcohol level:   < 11    Environmental/Housing/Living Arrangement  Stable housing   Who is in the home:   Patient lives alone   Emergency contact:  Lawanna Kobus is Power of attorney and can be reached at [phone]   Financial  Medicare   Patient's Strengths and Goals (patient's own words):   Patient reports she had [facility] coming in to home to help prior to admit.  Patient does have sense of humor; when requested if CSW could contact sister patient declined and said "You'll never get off the phone" and later suggest CSW contact power of attorney, Lawanna Kobus, whose name was on board in room.   Clinical Social Worker's Interpretive Summary:   Patient was flat,  guarded, restless and appeared inattentive during assessment. Most answers to questions were one word and difficult to understand.  Patient talked about coming here on the train without ever affirming she lives her until late in conversation. Patient reports she works at [employer], rides three wheel bike for transportation and goes to get alcohol "whatever I choose to get, whenever I choose to go." CSW will follow up with power of attorney and sister for collateral.   Disposition:  Recommend Psych CSW continue to followup with family and power of attorney.
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
## 2012-04-09 DIAGNOSIS — M6282 Rhabdomyolysis: Secondary | ICD-10-CM

## 2012-04-09 DIAGNOSIS — I6330 Cerebral infarction due to thrombosis of unspecified cerebral artery: Secondary | ICD-10-CM

## 2012-04-09 LAB — LUPUS ANTICOAGULANT PANEL: Lupus Anticoagulant: NOT DETECTED

## 2012-04-09 LAB — CK: Total CK: 187 U/L — ABNORMAL HIGH (ref 7–177)

## 2012-04-09 MED ORDER — MELOXICAM 7.5 MG PO TABS
7.5000 mg | ORAL_TABLET | Freq: Every day | ORAL | Status: DC | PRN
  Administered 2012-04-19: 7.5 mg via ORAL
  Filled 2012-04-09 (×2): qty 1
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
## 2012-04-09 NOTE — Progress Notes (Signed)
Physical Therapy Treatment Patient Details Name: Tami Nolan MRN: [id number] DOB: 01-Jun-1961 Today's Date: 04/09/2012 Time: 3875-6433 PT Time Calculation (min): 23 min  PT Assessment / Plan / Recommendation Comments on Treatment Session   Pt required increased (A) for mobility today     Follow Up Recommendations  Post acute inpatient     Does the patient have the potential to tolerate intense rehabilitation  No, Recommend SNF  Barriers to Discharge        Equipment Recommendations  3 in 1 bedside comode;Rolling walker with 5" wheels    Recommendations for Other Services    Frequency Min 3X/week   Plan Discharge plan remains appropriate;Frequency remains appropriate    Precautions / Restrictions Precautions Precautions: Fall Restrictions Weight Bearing Restrictions: No Other Position/Activity Restrictions: Pt with increased tone throughout R side with foot inverting and unable to keep R foot flat on floor.   Pt     Mobility  Bed Mobility Bed Mobility: Supine to Sit;Sitting - Scoot to Edge of Bed Supine to Sit: 1: +2 Total assist;HOB flat;Other (comment) (pt in long sitting) Supine to Sit: Patient Percentage: 20% Sitting - Scoot to Edge of Bed: 1: +1 Total assist Details for Bed Mobility Assistance: Pt with grasp in R hand but unable to release.  Pt moving R side but not using it functionally yet.  Pt did lift it slightly to put in arm hole of gown.  Pt fairly dependent for all bed mobility. Transfers Transfers: Sit to Stand;Stand to Sit;Stand Pivot Transfers Sit to Stand: 1: +2 Total assist;From bed;With upper extremity assist Sit to Stand: Patient Percentage: 30% Stand to Sit: 1: +2 Total assist;Without upper extremity assist;To chair/3-in-1 Stand to Sit: Patient Percentage: 20% Details for Transfer Assistance: Used RW.  Allowed pt to begin with hands on RW.  required (A) to place hands properly on RW.  Pt leans to Lt & does not place Rt foot flat on floor.  Does not  stand fully erect.  Required physical (A) to move LE's during pivot from bed>recliner.   Ambulation/Gait Ambulation/Gait Assistance: Not tested (comment)    Exercises     PT Diagnosis:    PT Problem List:   PT Treatment Interventions:     PT Goals Acute Rehab PT Goals Time For Goal Achievement: 04/20/12 Potential to Achieve Goals: Fair Pt will go Supine/Side to Sit: with supervision PT Goal: Supine/Side to Sit - Progress: Progressing toward goal Pt will go Sit to Supine/Side: with supervision Pt will go Sit to Stand: with min assist PT Goal: Sit to Stand - Progress: Not progressing Pt will go Stand to Sit: with min assist PT Goal: Stand to Sit - Progress: Not progressing Pt will Transfer Bed to Chair/Chair to Bed: with min assist PT Transfer Goal: Bed to Chair/Chair to Bed - Progress: Progressing toward goal Pt will Ambulate: 51 - 150 feet;with min assist;with least restrictive assistive device  Visit Information  Last PT Received On: 04/09/12 Assistance Needed: +2    Subjective Data      Cognition  Overall Cognitive Status: Impaired Area of Impairment: Attention;Memory;Following commands;Safety/judgement;Awareness of errors;Problem solving;Awareness of deficits;Executive functioning Arousal/Alertness: Lethargic Orientation Level: Disoriented to;Place;Time;Situation Behavior During Session: Flat affect Current Attention Level: Focused Attention - Other Comments: barely focused.   Memory: Decreased recall of precautions Memory Deficits: pt unable to state where she was or why here but did pick "hospital" when given two choices.  Did pick "because I fell" when given two choices. Following Commands: Follows  one step commands inconsistently Safety/Judgement: Impulsive;Decreased safety judgement for tasks assessed;Decreased awareness of need for assistance Safety/Judgement - Other Comments: attempting to climb out of bed on arrival; sitter present Awareness of Errors:  Assistance required to identify errors made;Assistance required to correct errors made Awareness of Errors - Other Comments: Required assistance identify incorrect answers to cognitive questions. Awareness of Deficits: Pt unaware of balance deficits sitting EOB. Problem Solving: Pt unable to participate in any problem solving at this time. Executive Functioning: impaired. Cognition - Other Comments: Pt perserverates.  Pt was able to answer 4 questions correctly when given two choices.  Pt unable to generate any answers to questions on her own.    Balance  Static Sitting Balance Static Sitting - Balance Support: Feet supported;No upper extremity supported Static Sitting - Level of Assistance: 5: Stand by assistance Static Sitting - Comment/# of Minutes: Pt sat EOB x 10 mins with SBA.  Slumped posture & leaning to Lt.  Props up on elbows in lap.    End of Session PT - End of Session Equipment Utilized During Treatment: Gait belt Activity Tolerance: Patient tolerated treatment well Patient left: in chair;with call bell/phone within reach;Other (comment) (sitter present) Nurse Communication: Mobility status     Verdell Face, [place] [phone] 04/09/2012

## 2012-04-09 NOTE — Progress Notes (Signed)
Occupational Therapy Treatment Patient Details Name: Tami Nolan MRN: [id number] DOB: July 30, 1960 Today's Date: 04/09/2012 Time: 7829-5621 OT Time Calculation (min): 30 min  OT Assessment / Plan / Recommendation Comments on Treatment Session Pt seems to be more dependent in mobility and adls today than on eval on 11/2.    Follow Up Recommendations  Skilled nursing facility    Barriers to Discharge       Equipment Recommendations  3 in 1 bedside comode;Rolling walker with 5" wheels    Recommendations for Other Services    Frequency Min 2X/week   Plan Discharge plan remains appropriate    Precautions / Restrictions Precautions Precautions: Fall Restrictions Weight Bearing Restrictions: No Other Position/Activity Restrictions: Pt with increased tone throughout R side with foot inverting and unable to keep R foot flat on floor.   Pertinent Vitals/Pain Pt with no facial grimacing etc to think she was in pain.  Pt with profuse sweating.    ADL  Grooming: Performed;Wash/dry hands;Wash/dry face;+1 Total assistance Where Assessed - Grooming: Supported sitting Upper Body Bathing: Performed;+1 Total assistance Where Assessed - Upper Body Bathing: Supported sitting Upper Body Dressing: Performed;+1 Total assistance;Other (comment) (pt 20%) Where Assessed - Upper Body Dressing: Supported sitting Lower Body Dressing: Performed;+1 Total assistance (pt 20%) Where Assessed - Lower Body Dressing: Supported sitting Toilet Transfer: Performed;+2 Total assistance Toilet Transfer: Patient Percentage: 30% Statistician Method: Sit to Barista: Bedside commode Equipment Used: Rolling walker Transfers/Ambulation Related to ADLs: +2 assist for sit<>stand at bedside, ADL Comments: Pt requires hand over hand assist for all adls to initiate and follow through.  Pt doing about 20% of adls with hand over hand asssit.    OT Diagnosis:    OT Problem List:   OT Treatment  Interventions:     OT Goals Acute Rehab OT Goals OT Goal Formulation: With patient Time For Goal Achievement: 04/20/12 Potential to Achieve Goals: Good ADL Goals Pt Will Perform Grooming: with supervision;Unsupported;Sitting, chair;Sitting, edge of bed;Other (comment) ADL Goal: Grooming - Progress: Progressing toward goals Pt Will Perform Upper Body Bathing: with supervision;Sitting, chair;Sitting, edge of bed;Unsupported;Other (comment) ADL Goal: Upper Body Bathing - Progress: Progressing toward goals Pt Will Perform Lower Body Bathing: with supervision;Sit to stand from chair;Sit to stand from bed;Unsupported;Other (comment) Pt Will Transfer to Toilet: with supervision;Stand pivot transfer;with DME;3-in-1 ADL Goal: Toilet Transfer - Progress: Progressing toward goals Pt Will Perform Toileting - Clothing Manipulation: with supervision;Sitting on 3-in-1 or toilet Pt Will Perform Toileting - Hygiene: with supervision;Sit to stand from 3-in-1/toilet Miscellaneous OT Goals Miscellaneous OT Goal #1: Pt will demonstrate sustained attention during 75% of ADL tasks with min verbal cueing. OT Goal: Miscellaneous Goal #1 - Progress: Progressing toward goals Miscellaneous OT Goal #2: Pt will perform static sitting balance task EOB >5 min in prep for ADLs. OT Goal: Miscellaneous Goal #2 - Progress: Progressing toward goals  Visit Information  Last OT Received On: 04/09/12 Assistance Needed: +2 PT/OT Co-Evaluation/Treatment: Yes    Subjective Data      Prior Functioning       Cognition  Overall Cognitive Status: Impaired Area of Impairment: Attention;Memory;Following commands;Safety/judgement;Awareness of errors;Problem solving;Awareness of deficits;Executive functioning Arousal/Alertness: Lethargic Orientation Level: Disoriented to;Place;Time;Situation Behavior During Session: Flat affect Current Attention Level: Focused Attention - Other Comments: barely focused.   Memory: Decreased  recall of precautions Memory Deficits: pt unable to state where she was or why here but did pick "hospital" when given two choices.  Did pick "because I fell"  when given two choices. Following Commands: Follows one step commands inconsistently Safety/Judgement: Impulsive;Decreased safety judgement for tasks assessed;Decreased awareness of need for assistance Safety/Judgement - Other Comments: attempting to climb out of bed on arrival; sitter present Awareness of Errors: Assistance required to identify errors made;Assistance required to correct errors made Awareness of Errors - Other Comments: Required assistance identify incorrect answers to cognitive questions. Awareness of Deficits: Pt unaware of balance deficits sitting EOB. Problem Solving: Pt unable to participate in any problem solving at this time. Executive Functioning: impaired. Cognition - Other Comments: Pt perserverates.  Pt was able to answer 4 questions correctly when given two choices.  Pt unable to generate any answers to questions on her own.    Mobility  Shoulder Instructions Bed Mobility Bed Mobility: Supine to Sit;Sitting - Scoot to Edge of Bed Supine to Sit: 1: +2 Total assist;HOB flat;Other (comment) (pt in long sitting) Supine to Sit: Patient Percentage: 20% Sitting - Scoot to Edge of Bed: 1: +1 Total assist Details for Bed Mobility Assistance: Pt with grasp in R hand but unable to release.  Pt moving R side but not using it functionally yet.  Pt did lift it slightly to put in arm hole of gown.  Pt fairly dependent for all bed mobility. Transfers Transfers: Sit to Stand;Stand to Sit Sit to Stand: 1: +2 Total assist;From bed;With upper extremity assist Sit to Stand: Patient Percentage: 30% Stand to Sit: 1: +2 Total assist;Without upper extremity assist;To chair/3-in-1 Stand to Sit: Patient Percentage: 20% Details for Transfer Assistance: Pt needs a lot of assist to stand straight.  Pt both hands on walker to let pt  support self on walker and this did help, but then pts awareness that she is standing is not there so she begins to lean.       Exercises      Balance     End of Session OT - End of Session Activity Tolerance: Patient limited by fatigue Patient left: in chair;with call bell/phone within reach;with family/visitor present Nurse Communication: Mobility status  GO     Hope Budds [phone] 04/09/2012, 9:29 AM

## 2012-04-09 NOTE — Consult Note (Signed)
Patient Identification:  Tami Nolan Date of Evaluation:  04/09/2012 Reason for Consult:evaluate Capacity  Referring Provider: Dr. Cena Benton  History of Present Illness: Patient was found down in her home. No one really knows what transpired.  Past Psychiatric History: History provided patient has had a stroke and may account for her lack of interaction She has a history of smoking cigarettes and drinking alcohol  Past Medical History:     Past Medical History  Diagnosis Date  . Hepatitis     ETOH related   . ETOH abuse   . Hx of abnormal cervical Pap smear 1997  . Colon polyps 1997  . Genital warts   . Multiple sclerosis        Past Surgical History  Procedure Date  . Bilatetral tubal ligation     Allergies: No Known Allergies  Current Medications:  Prior to Admission medications   Medication Sig Start Date End Date Taking? Authorizing Provider  cyclobenzaprine (FLEXERIL) 10 MG tablet Take 10 mg by mouth 2 (two) times daily.   Yes Historical Provider, MD  gabapentin (NEURONTIN) 600 MG tablet Take 1,200 mg by mouth 3 (three) times daily.    Yes Historical Provider, MD  HYDROcodone-acetaminophen (NORCO/VICODIN) 5-325 MG per tablet Take 1 tablet by mouth every 4 (four) hours as needed. Pain   Yes Historical Provider, MD  ibuprofen (ADVIL,MOTRIN) 800 MG tablet Take 800 mg by mouth 2 (two) times daily.   Yes Historical Provider, MD  loratadine (CLARITIN) 10 MG tablet Take 10 mg by mouth daily.   Yes Historical Provider, MD  montelukast (SINGULAIR) 10 MG tablet Take 10 mg by mouth at bedtime.     Yes Historical Provider, MD  Multiple Vitamin (MULTIVITAMIN WITH MINERALS) TABS Take 1 tablet by mouth daily.   Yes Historical Provider, MD  simvastatin (ZOCOR) 40 MG tablet Take 40 mg by mouth at bedtime.     Yes Historical Provider, MD  trazodone (DESYREL) 300 MG tablet Take 300 mg by mouth at bedtime.   Yes Historical Provider, MD  venlafaxine (EFFEXOR-XR) 150 MG 24 hr capsule Take 150  mg by mouth daily.     Yes Historical Provider, MD  venlafaxine (EFFEXOR-XR) 75 MG 24 hr capsule Take 75 mg by mouth daily.     Yes Historical Provider, MD    Social History:    reports that she has been smoking Cigarettes.  She has a 15 pack-year smoking history. She has never used smokeless tobacco. She reports that she does not drink alcohol or use illicit drugs.   Family History:    Family History  Problem Relation Age of Onset  . Coronary artery disease Brother   . Diabetes Brother   . Hypertension Brother   . Hypertension Mother   . Diabetes Mother   . Hypertension Sister   . Alcohol abuse      family history     Mental Status Examination/Evaluation:  Pt has good eye contact.  She struggles to raise self from lying to sitting position.  Her posture is poor. She speaks with barely audible voice.  She does not know the date.  She does not demonstrate strong memory skills.  She cannot calculate serial 3s, cannot explain a proverb.  She does not recall 3 items in 5 minutes.  She has a restricted affect, though she smiles. Her limitation during this mental status exam may be related to a stroke versus dementia.  DIAGNOSIS:   AXIS I  Delirium, vs dementia vs  MR  AXIS II  Deffered  AXIS III See medical notes.  AXIS IV other psychosocial or environmental problems and Complicated by medical problems, question stroke  AXIS V 41-50 serious symptoms   Assessment/Plan:  Discussed with Dr. Cena Benton This patient is barely reactive during this evaluation. She can say some things barely audible and other aspects of memory, copulation and basic fund of knowledge are lacking. Collateral information from family would be most useful. RECOMMENDATION:  1. Will follow this patient Tami Nolan J. Ferol Luz, MD Psychiatrist 04/09/2012   9:45 PM
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
## 2012-04-10 DIAGNOSIS — F329 Major depressive disorder, single episode, unspecified: Secondary | ICD-10-CM

## 2012-04-10 DIAGNOSIS — F3289 Other specified depressive episodes: Secondary | ICD-10-CM

## 2012-04-10 LAB — COMPLEMENT, TOTAL: Compl, Total (CH50): >60 U/mL — ABNORMAL HIGH (ref 31–60)

## 2012-04-10 LAB — CK: Total CK: 445 U/L — ABNORMAL HIGH (ref 7–177)

## 2012-04-10 LAB — PROTEIN C, TOTAL: Protein C, Total: 95 % (ref 72–160)

## 2012-04-10 MED ORDER — LORAZEPAM 1 MG PO TABS
1.0000 mg | ORAL_TABLET | ORAL | Status: DC | PRN
  Administered 2012-04-10 – 2012-04-16 (×20): 1 mg via ORAL
  Filled 2012-04-10 (×11): qty 1
  Filled 2012-04-10: qty 2
  Filled 2012-04-10 (×13): qty 1

## 2012-04-10 MED ORDER — LORAZEPAM 2 MG/ML IJ SOLN: 0.5000 mg | INTRAMUSCULAR | Status: DC | PRN

## 2012-04-10 NOTE — Progress Notes (Signed)
Rehab admissions - Evaluated for possible admission.  Please see inpatient rehab consult done by Dr. Riley Kill on 11/05 recommending SNF.  Patient is not able to participate fully with therapies.  Potential for functional recovery if poor to fair and will take and extended period of time per Dr. Riley Kill.  At this time, recommend SNF level therapies.  Call me for questions.   #[phone]

## 2012-04-10 NOTE — Clinical Social Work Note (Signed)
Clinical Social Work  CSW received a phone call from pt's brother, Fayrene Fearing, inquiring about how he would obtain HCPOA over his sister. CSW explained the process. At this time, it has yet to be determined if pt has capacity to make decisions.   Pt's niece, who came to visit, shared that she is pt's HCPOA and this was established during previous admission. However she did not have the HCPOA paperwork and will be looking for it when she returns home. Pt's niece is agreeable to placement at a SNF, as pt needs assistance with transfers and ambulation.   CSW spoke with [facility] DSS APS worker, Toy Baker. Ms. Garrison Columbus shared that she does not feel that pt is safe to return home and recommends placement, as pt unable to care for herself and live independently. Ms. Garrison Columbus also shared that she is prepared to file for guardianship in the event that it is determined that pt lacks capacity.   CSW will initiate SNF search.CSW will follow up with supervision on HCPOA and guardianship matter. CSW will also continue to follow to facilitate discharge plans.   Dede Query, MSW, [facility] [phone]

## 2012-04-10 NOTE — Progress Notes (Signed)
Patient was unable to rest all night. Was found to be restless and required PRN ativan. She was also diaphoretic a few times and cold compresses was applied. Brother sleep through the night and 24 hr sitter present. After ativan 0.5mg  PO given patient calmed down and stopped trying to get out of bed but was unable to sleep and rest. Patient was noted to be  delusional also.

## 2012-04-10 NOTE — Progress Notes (Signed)
[facility] PROGRESS NOTE  Tami Nolan:[id number] DOB: 1961/04/08 DOA: 04/05/2012 PCP: [name], MD  Assessment/Plan:  1, Stroke Imaging confirms a left internal capsule infarct - Consulted Neuro and they have signed off. - We are to continue aspirin 325 mg po daily - statin was held on admission given patients rhabdomyolysis. Should be continued once patient ready for discharge - Cardiac MRI negative for clot,  ASD or VSD. - smoking cessation counseling - Patient with resultant right hemiplegia, right facial weakness, right neurologic neglect. - Disposition will need to be addressed as patient at this point not felt to be safe to transition home.  Nursing reports that patient is debilitated needing 2 person assists to transfer out of bed.  May not return back to home per recommendations of adult protective services (house was too dirty) - Psychiatry  Following.  2. Hypokalemia - Magnesium levels within normal limits - Improved after oral repletion.  3. Rhabdomyolysis - given history probably related to being on the floor for a prolonged period of time - CPK trending down with IVF hydration. - recheck level next am. - continue IVF's  4. Nicotine Abuse - Continue nicotine patch - recommend cessation  Psychiatry to reevaluate for capacity.   Code Status: full Family Communication: Spoke with patient and brother Rhea Bleacher [phone]) Disposition Plan: SNF   Consultants:  Neurology  Psychiatry  Procedures:  MRI/CT of head  Antibiotics:  None  HPI/Subjective: No new complaints reported.   Objective: Filed Vitals:   04/09/12 2049 04/10/12 0556 04/10/12 1000 04/10/12 1400  BP: 165/114 108/75 154/111 134/100  Pulse: 126 124 129 115  Temp: 97.4 F (36.3 C) 98.2 F (36.8 C) 98.3 F (36.8 C) 98 F (36.7 C)  TempSrc:  Oral Oral Oral  Resp: 16 18 18 18   Height:      Weight:      SpO2: 100% 100% 100% 100%   No intake or output data in  the 24 hours ending 04/10/12 1812 Filed Weights   04/05/12 1206  Weight: 79.379 kg (175 lb)    Exam:   General:  Pt in NAD, Alert and Awake  Cardiovascular: RRR, No mrg  Respiratory: CTA BL, no wheezes  Abdomen: soft, NT, Nondistended  Neuro: Pt has right upper and lower extremity weakness when compared to the left, answers questions appropriately  Data Reviewed: Basic Metabolic Panel:  Lab 04/07/12 3086 04/06/12 1054 04/05/12 1319  NA 135 -- 137  K 3.5 -- 3.1*  CL 100 -- 98  CO2 26 -- 25  GLUCOSE 196* -- 112*  BUN 12 -- 14  CREATININE 0.44* -- 0.41*  CALCIUM 9.3 -- 10.1  MG -- 2.0 --  PHOS -- -- --   Liver Function Tests:  Lab 04/05/12 1319  AST 51*  ALT 25  ALKPHOS 67  BILITOT 0.5  PROT 8.3  ALBUMIN 4.0   No results found for this basename: LIPASE:5,AMYLASE:5 in the last 168 hours No results found for this basename: AMMONIA:5 in the last 168 hours CBC:  Lab 04/05/12 1319  WBC 8.1  NEUTROABS 4.9  HGB 15.4*  HCT 41.8  MCV 78.0  PLT 290   Cardiac Enzymes:  Lab 04/10/12 0618 04/09/12 0656 04/08/12 0550 04/07/12 0825 04/06/12 0635 04/05/12 1319  CKTOTAL 445* 187* 201* 336* 683* --  CKMB -- -- -- -- -- --  CKMBINDEX -- -- -- -- -- --  TROPONINI -- -- -- -- -- <0.30   BNP (last 3 results) No results  found for this basename: PROBNP:3 in the last 8760 hours CBG: No results found for this basename: GLUCAP:5 in the last 168 hours  No results found for this or any previous visit (from the past 240 hour(s)).   Studies: No results found.  Scheduled Meds:    . aspirin  325 mg Oral Daily  . enoxaparin  40 mg Subcutaneous Q24H  . nicotine  21 mg Transdermal Daily   Continuous Infusions:    . sodium chloride 100 mL/hr at 04/06/12 1057    Principal Problem:  *Rhabdomyolysis Active Problems:  Stroke  Hypokalemia  Nicotine abuse    Time spent: > 35 minutes    [facility]  [facility] Pager [phone]. If 8PM-8AM, please contact  night-coverage at [email or web address], password [facility] 04/10/2012, 6:12 PM  LOS: 5 days

## 2012-04-10 NOTE — Progress Notes (Addendum)
Clinical Social Work Progress Note PSYCHIATRY SERVICE LINE 04/10/2012  Patient:  Tami Nolan  Account:  [id number]  Admit Date:  04/05/2012  Clinical Social Worker:  Ronda Fairly, CLINICAL SOCIAL WORKER  Date/Time:     Review of Patient  Overall Medical Condition:   Please see medical notes   Participation Level:  Minimal  Participation Quality  Other - See comment   Other Participation Quality:   Patient experiencing continued difficulty with communication.  Would at times repeat a word multiple times.  "I got mad (mad, mad, mad) and broke (broke, broke, broke).  After lengthy exploration she reports her glasses were broken.  A pair of glasses were observed by sitter in room and cleaned and put on patient who then opened her eyes and made eye contact.   Affect  Flat   Cognitive  Confused   Reaction to Medications/Concerns:   See medical report   Modes of Intervention  Exploration  Problem-solving  Support   Summary of Progress/Plan at Discharge   Patient was observed in wheelchair with CMA pushing her around unit in order to reportedly give patient a change of scenery as she was agitated earlier.  Patient provided permission to contact Tami Nolan at [phone]. Tami Nolan reports that she has POA although currently searching for documentation and agrees that patient will need care upon discharge which family cannot provide.  Family interested in [facility] in [place] where patient was recently discharged from and soon thereafter admitted to hospital.  Tami Nolan agreed to 4N CSW sending out request for suitable placement near family in [place].   Psych CSW signing off. 04/12/2012 1:14 PM

## 2012-04-10 NOTE — Progress Notes (Signed)
Progress Note  S/p consultation Patient Identification:  Tami Nolan Date of Evaluation:  04/10/2012 Reason for Consult:  Referring Provider:   Past Psychiatric History: History provided patient has had a stroke and may account for her lack of interaction  MRI found L internal capsule infarct.   She has a history of smoking cigarettes and drinking alcohol  Pt has MS   Past Medical History:     Past Medical History  Diagnosis Date  . Hepatitis     ETOH related   . ETOH abuse   . Hx of abnormal cervical Pap smear 1997  . Colon polyps 1997  . Genital warts   . Multiple sclerosis        Past Surgical History  Procedure Date  . Bilatetral tubal ligation     Allergies: No Known Allergies  Current Medications:  Prior to Admission medications   Medication Sig Start Date End Date Taking? Authorizing Provider  cyclobenzaprine (FLEXERIL) 10 MG tablet Take 10 mg by mouth 2 (two) times daily.   Yes Historical Provider, MD  gabapentin (NEURONTIN) 600 MG tablet Take 1,200 mg by mouth 3 (three) times daily.    Yes Historical Provider, MD  HYDROcodone-acetaminophen (NORCO/VICODIN) 5-325 MG per tablet Take 1 tablet by mouth every 4 (four) hours as needed. Pain   Yes Historical Provider, MD  ibuprofen (ADVIL,MOTRIN) 800 MG tablet Take 800 mg by mouth 2 (two) times daily.   Yes Historical Provider, MD  loratadine (CLARITIN) 10 MG tablet Take 10 mg by mouth daily.   Yes Historical Provider, MD  montelukast (SINGULAIR) 10 MG tablet Take 10 mg by mouth at bedtime.     Yes Historical Provider, MD  Multiple Vitamin (MULTIVITAMIN WITH MINERALS) TABS Take 1 tablet by mouth daily.   Yes Historical Provider, MD  simvastatin (ZOCOR) 40 MG tablet Take 40 mg by mouth at bedtime.     Yes Historical Provider, MD  trazodone (DESYREL) 300 MG tablet Take 300 mg by mouth at bedtime.   Yes Historical Provider, MD  venlafaxine (EFFEXOR-XR) 150 MG 24 hr capsule Take 150 mg by mouth daily.     Yes Historical  Provider, MD  venlafaxine (EFFEXOR-XR) 75 MG 24 hr capsule Take 75 mg by mouth daily.     Yes Historical Provider, MD    Social History:    reports that she has been smoking Cigarettes.  She has a 15 pack-year smoking history. She has never used smokeless tobacco. She reports that she does not drink alcohol or use illicit drugs.   DIAGNOSIS:   AXIS I  Depression s/p CVA MS and R sided hemiplegia  AXIS II  Deffered  AXIS III See medical notes.  AXIS IV economic problems, housing problems, other psychosocial or environmental problems, problems related to social environment, problems with access to health care services and poor communication skills  AXIS V 41-50 serious symptoms  \ Assessment/Plan:  Dr. Rosalyn Charters note appreciated. Pt has numerous conditions that support need for SNF or equivalent facility.  Pt wants to go home.  She is barely able to express herself, ambulate and safety is a major issue for her; noted that she was found down prior to admission.    RECOMMENDATION: 1. Suggest SNF for this pt with IVC for failure to understand the severity of her medical/mental condition.  Home is an unsafe option for this pt at this time.  2.  No further psychiatric needs unless requested.  MD Psychaitrist signs off.. and he words .  Teralyn Mullins J. Ferol Luz, MD Psychiatrist  04/10/2012 1:12 PM

## 2012-04-11 NOTE — Progress Notes (Signed)
Occupational Therapy Treatment Patient Details Name: Tami Nolan MRN: [id number] DOB: 29-Jul-1960 Today's Date: 04/11/2012 Time: 0811-0829 OT Time Calculation (min): 18 min  OT Assessment / Plan / Recommendation Comments on Treatment Session Pt with with limited participation in session and increased dependency during mobility and ADLs today.  Perseverating on her money and unable to focus on other tasks.     Follow Up Recommendations  SNF    Barriers to Discharge       Equipment Recommendations  3 in 1 bedside comode;Rolling walker with 5" wheels    Recommendations for Other Services    Frequency Min 2X/week   Plan Discharge plan remains appropriate    Precautions / Restrictions Precautions Precautions: Fall Restrictions Other Position/Activity Restrictions: Pt with increased tone throughout R side with foot inverting and unable to keep R foot flat on floor.   Pertinent Vitals/Pain See vitals    ADL  Grooming: Performed;Wash/dry face;+1 Total assistance Where Assessed - Grooming: Unsupported sitting Lower Body Dressing: Performed;+1 Total assistance Where Assessed - Lower Body Dressing: Unsupported sitting Toilet Transfer: Simulated;+2 Total assistance Toilet Transfer: Patient Percentage: 20% Toilet Transfer Method: Ambulance person:  (bed to chair) Equipment Used: Gait belt Transfers/Ambulation Related to ADLs: +2 assist for squat pivot from bed to chair. ADL Comments: Hand over hand assist for washing face.  However, pt resistant to hand over hand and performed 10% of task.  Pt perseverating on "her money" throughout session and becoming agitated as session progressed.    OT Diagnosis:    OT Problem List:   OT Treatment Interventions:     OT Goals ADL Goals Pt Will Perform Grooming: with supervision;Unsupported;Sitting, chair;Sitting, edge of bed;Other (comment) ADL Goal: Grooming - Progress: Progressing toward goals Pt Will Transfer to  Toilet: with supervision;Stand pivot transfer;with DME;3-in-1 ADL Goal: Toilet Transfer - Progress: Progressing toward goals Miscellaneous OT Goals Miscellaneous OT Goal #1: Pt will demonstrate sustained attention during 75% of ADL tasks with min verbal cueing. OT Goal: Miscellaneous Goal #1 - Progress: Progressing toward goals Miscellaneous OT Goal #2: Pt will perform static sitting balance task with supervision EOB >5 min in prep for ADLs. OT Goal: Miscellaneous Goal #2 - Progress: Met  Visit Information  Last OT Received On: 04/11/12 Assistance Needed: +2    Subjective Data      Prior Functioning       Cognition  Overall Cognitive Status: Impaired Area of Impairment: Attention;Memory;Following commands;Safety/judgement;Awareness of errors;Problem solving;Awareness of deficits;Executive functioning Arousal/Alertness: Lethargic Orientation Level: Disoriented to;Situation;Time Behavior During Session: Flat affect Current Attention Level: Focused (Perseverating on her money) Memory: Decreased recall of precautions Following Commands: Follows one step commands inconsistently Safety/Judgement: Decreased safety judgement for tasks assessed Safety/Judgement - Other Comments: sitter present Awareness of Errors: Assistance required to identify errors made;Assistance required to correct errors made Awareness of Errors - Other Comments: Required assistance identify incorrect answers to cognitive questions. Awareness of Deficits: Pt would not sit erect on EOB, pt. leaned her elbows on her knees. Problem Solving: Pt unable to participate in any problem solving at this time. Executive Functioning: impaired. Cognition - Other Comments: Pt repeated throughout session "Where's my money? Where's my money? Where's my money?"  Pt with eyes closed majority of session. Pt did however open her eyes briefly while sitting EOB and reported she was looking for her money.    Mobility  Shoulder  Instructions Bed Mobility Bed Mobility: Supine to Sit;Sitting - Scoot to Edge of Bed Supine to Sit: 1: +  2 Total assist Supine to Sit: Patient Percentage: 0% Sitting - Scoot to Edge of Bed: 1: +2 Total assist Sitting - Scoot to Edge of Bed: Patient Percentage: 20% Details for Bed Mobility Assistance: Pt. completely dependent with getting to EOB and perseverating on where her money was throughout treatment. At EOB pt strongly encouraged to sit erect, but remained in a significantly flexed position with her elbows on her knees. Pt. given max encouragement to help with activity, Transfers Transfers: Sit to Stand;Stand to Sit Sit to Stand: 1: +2 Total assist;From bed Sit to Stand: Patient Percentage: 20% Stand to Sit: 1: +2 Total assist;To chair/3-in-1 Stand to Sit: Patient Percentage: 20% Details for Transfer Assistance: Pt unable to achieve full upright stand. Performed squat pivot transfer from bed to chair.        Exercises      Balance Static Sitting Balance Static Sitting - Balance Support: Feet supported;No upper extremity supported Static Sitting - Level of Assistance: 5: Stand by assistance Static Sitting - Comment/# of Minutes: Pt. sat EOB for ~6 mins with max encouragement to sit erect. Pt. maintained her balance at EOB with a significantly flexed posture and her elbows supported on her thighs/knees.   End of Session OT - End of Session Equipment Utilized During Treatment: Gait belt Activity Tolerance: Treatment limited secondary to agitation;Other (comment) (limited by perseveration on money) Patient left: in chair;Other (comment) (with sitter) Nurse Communication: Mobility status  GO    04/11/2012 Cipriano Mile OTR/L Pager [phone] Office [phone]  Cipriano Mile 04/11/2012, 9:04 AM

## 2012-04-11 NOTE — Clinical Social Work Note (Signed)
Clinical Social Work  CSW sent requested notes to [facility] APS for review. CSW also sent referral for SNF placement to [facility], and CSW will follow up on bed offers. CSW will continue to follow.   Dede Query, MSW, Theresia Majors [phone]

## 2012-04-11 NOTE — Progress Notes (Signed)
Physical Therapy Treatment Patient Details Name: Tami Nolan MRN: [id number] DOB: 09/10/1960 Today's Date: 04/11/2012 Time: 0811-0829 PT Time Calculation (min): 18 min  PT Assessment / Plan / Recommendation Comments on Treatment Session  Pt. highly perseverating on where her money was throughout the PT/OT session,. Pt. encouraged todo as much as she could all throughout the sesion with minimal active participation. Pt. presents with strong Rt. hand grasp and would barely hold her eyes open during session even when addressed to. Pt. able to state her name and where she was, but was disoriented to time and situation.     Follow Up Recommendations        Does the patient have the potential to tolerate intense rehabilitation  No, Recommend SNF  Barriers to Discharge        Equipment Recommendations  3 in 1 bedside comode;Rolling walker with 5" wheels    Recommendations for Other Services    Frequency Min 3X/week   Plan Discharge plan remains appropriate;Frequency remains appropriate    Precautions / Restrictions Precautions Precautions: Fall Restrictions Weight Bearing Restrictions: No Other Position/Activity Restrictions: Pt with increased tone throughout R side with foot inverting and unable to keep R foot flat on floor.   Pertinent Vitals/Pain Patient did not give rating of any pain she may have been experiencing. Pt. Highly perseverated on where her money was.    Mobility  Bed Mobility Bed Mobility: Supine to Sit;Sitting - Scoot to Edge of Bed Supine to Sit: 1: +2 Total assist Supine to Sit: Patient Percentage: 0% Sitting - Scoot to Edge of Bed: 1: +2 Total assist Sitting - Scoot to Edge of Bed: Patient Percentage: 20% Details for Bed Mobility Assistance: Pt. completely dependent with getting to EOB and perseverating on where her money was throughout treatment. At EOB pt strongly encouraged to sit erect, but remained in a significantly flexed position with her elbows on her  knees. Pt. given max encouragement to help with activity, Transfers Transfers: Stand Pivot Transfers Stand Pivot Transfers: 1: +2 Total assist Stand Pivot Transfers: Patient Percentage: 20% Details for Transfer Assistance: Pt. did minimal during stand pivot transfer to the chair with max encouragement to help PT/OT get her to the chair. Pt. stuck on the thoughts of where he money was. Pt. with strong grasp in her Rt. hand/ Ambulation/Gait Ambulation/Gait Assistance: Not tested (comment) Wheelchair Mobility Wheelchair Mobility: No      PT Goals Acute Rehab PT Goals PT Goal Formulation: Patient unable to participate in goal setting Time For Goal Achievement: 04/20/12 Potential to Achieve Goals: Fair Pt will go Supine/Side to Sit: with supervision PT Goal: Supine/Side to Sit - Progress: Not met Pt will go Sit to Supine/Side: with supervision Pt will go Sit to Stand: with min assist PT Goal: Sit to Stand - Progress: Not met Pt will go Stand to Sit: with min assist PT Goal: Stand to Sit - Progress: Not met Pt will Transfer Bed to Chair/Chair to Bed: with min assist PT Transfer Goal: Bed to Chair/Chair to Bed - Progress: Not met Pt will Ambulate: 51 - 150 feet;with min assist;with least restrictive assistive device  Visit Information  Last PT Received On: 04/11/12 Assistance Needed: +2 PT/OT Co-Evaluation/Treatment: Yes    Subjective Data  Subjective: "Where my money at?"   Cognition  Overall Cognitive Status: Impaired Arousal/Alertness: Lethargic Orientation Level: Disoriented to;Situation;Time Behavior During Session: Flat affect Current Attention Level: Focused (Perseverating on her money) Memory: Decreased recall of precautions Following Commands: Follows one  step commands inconsistently Safety/Judgement: Decreased safety judgement for tasks assessed Safety/Judgement - Other Comments: sitter present Awareness of Errors: Assistance required to identify errors  made;Assistance required to correct errors made Awareness of Errors - Other Comments: Required assistance identify incorrect answers to cognitive questions. Awareness of Deficits: Pt would not sit erect on EOB, pt. leaned her elbows on her knees. Problem Solving: Pt unable to participate in any problem solving at this time. Executive Functioning: impaired. Cognition - Other Comments: Pt. perseverates. Pt. asked repeatedly throughout treatment where he money was    Balance  Static Sitting Balance Static Sitting - Balance Support: Feet supported;No upper extremity supported Static Sitting - Level of Assistance: 5: Stand by assistance Static Sitting - Comment/# of Minutes: Pt. sat EOB for ~6 mins with max encouragement to sit erect. Pt. maintained her balance at EOB with a significantly flexed posture and her elbows supported on her thighs/knees.  End of Session PT - End of Session Equipment Utilized During Treatment: Gait belt Activity Tolerance: Patient tolerated treatment well Patient left: in chair;with call bell/phone within reach [facility] present in room ) Nurse Communication: Mobility status    Mertie Clause, SPTA 04/11/2012, 8:55 AM

## 2012-04-11 NOTE — Progress Notes (Signed)
Agree with SPTA's progress note  Twylia Oka, PTA [phone] 04/11/2012  

## 2012-04-11 NOTE — Progress Notes (Signed)
[facility] PROGRESS NOTE  Tami Nolan ZOX:[id number] DOB: 09/25/1960 DOA: 04/05/2012 PCP: [name], MD  Assessment/Plan:  1, Stroke Imaging confirms a left internal capsule infarct - Consulted Neuro and they have signed off. - We are to continue aspirin 325 mg po daily - statin was held on admission given patients rhabdomyolysis. Should be continued once patient ready for discharge - Cardiac MRI negative for clot,  ASD or VSD. - smoking cessation counseling - Patient with resultant right hemiplegia, right facial weakness, right neurologic neglect. - Disposition will need to be addressed as patient at this point not felt to be safe to transition home.  Nursing reports that patient is debilitated needing 2 person assists to transfer out of bed.  May not return back to home per recommendations of adult protective services (house was too dirty) - Psychiatry  Following.  2. Hypokalemia - Magnesium levels within normal limits - Improved after oral repletion.  3. Rhabdomyolysis - given history probably related to being on the floor for a prolonged period of time - CPK trending down with IVF hydration. -   4. Nicotine Abuse - Continue nicotine patch - recommend cessation  Psychiatry evaluated the patient and she does not have decision making capacity   Code Status: full Family Communication: Spoke with patient and brother Rhea Bleacher [phone]) Disposition Plan: SNF   Consultants:  Neurology  Psychiatry  Procedures:  MRI/CT of head  Antibiotics:  None  HPI/Subjective: No new complaints reported.   Objective: Filed Vitals:   04/11/12 0628 04/11/12 0853 04/11/12 0900 04/11/12 1400  BP: 137/101 128/100 154/115 132/100  Pulse: 119 114 125 114  Temp: 98.2 F (36.8 C)  98.7 F (37.1 C) 98 F (36.7 C)  TempSrc: Oral  Oral Oral  Resp: 20 18 22 19   Height:      Weight:      SpO2: 97%  100% 91%   No intake or output data in the 24 hours ending  04/11/12 1657 Filed Weights   04/05/12 1206  Weight: 79.379 kg (175 lb)    Exam:   General:  Pt in NAD, Alert and Awake  Cardiovascular: RRR, No mrg  Respiratory: CTA BL, no wheezes  Abdomen: soft, NT, Nondistended  Neuro: Pt has right upper and lower extremity weakness when compared to the left, answers questions appropriately  Data Reviewed: Basic Metabolic Panel:  Lab 04/07/12 5621 04/06/12 1054 04/05/12 1319  NA 135 -- 137  K 3.5 -- 3.1*  CL 100 -- 98  CO2 26 -- 25  GLUCOSE 196* -- 112*  BUN 12 -- 14  CREATININE 0.44* -- 0.41*  CALCIUM 9.3 -- 10.1  MG -- 2.0 --  PHOS -- -- --   Liver Function Tests:  Lab 04/05/12 1319  AST 51*  ALT 25  ALKPHOS 67  BILITOT 0.5  PROT 8.3  ALBUMIN 4.0   No results found for this basename: LIPASE:5,AMYLASE:5 in the last 168 hours No results found for this basename: AMMONIA:5 in the last 168 hours CBC:  Lab 04/05/12 1319  WBC 8.1  NEUTROABS 4.9  HGB 15.4*  HCT 41.8  MCV 78.0  PLT 290   Cardiac Enzymes:  Lab 04/11/12 0505 04/10/12 0618 04/09/12 0656 04/08/12 0550 04/07/12 0825 04/05/12 1319  CKTOTAL 197* 445* 187* 201* 336* --  CKMB -- -- -- -- -- --  CKMBINDEX -- -- -- -- -- --  TROPONINI -- -- -- -- -- <0.30   BNP (last 3 results) No results found for  this basename: PROBNP:3 in the last 8760 hours CBG: No results found for this basename: GLUCAP:5 in the last 168 hours  No results found for this or any previous visit (from the past 240 hour(s)).   Studies: No results found.  Scheduled Meds:    . aspirin  325 mg Oral Daily  . enoxaparin  40 mg Subcutaneous Q24H  . nicotine  21 mg Transdermal Daily   Continuous Infusions:    . sodium chloride 100 mL/hr at 04/06/12 1057    Principal Problem:  *Rhabdomyolysis Active Problems:  Stroke  Hypokalemia  Nicotine abuse    Time spent: > 25 minutes    [facility]  [facility] Pager [phone]. If 8PM-8AM, please contact night-coverage at  [email or web address], password [facility] 04/11/2012, 4:57 PM  LOS: 6 days

## 2012-04-12 MED ORDER — METOPROLOL TARTRATE 25 MG PO TABS
25.0000 mg | ORAL_TABLET | Freq: Two times a day (BID) | ORAL | Status: DC
  Administered 2012-04-12 – 2012-04-22 (×18): 25 mg via ORAL
  Filled 2012-04-12 (×21): qty 1

## 2012-04-12 NOTE — Progress Notes (Signed)
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 - We are to continue aspirin 325 mg po daily - statin was held on admission given patients rhabdomyolysis. Should be continued once patient ready for discharge - Cardiac MRI negative for clot,  ASD or VSD. - smoking cessation counseling - Patient with resultant right hemiplegia, right facial weakness, right neurologic neglect. - Disposition will need to be addressed as patient at this point not felt to be safe to transition home.  Nursing reports that patient is debilitated needing 2 person assists to transfer out of bed.  May not return back to home per recommendations of adult protective services (house was too dirty) - Psychiatry  Following.  2. Hypokalemia - Magnesium levels within normal limits - Improved after oral repletion.  3. Rhabdomyolysis - given history probably related to being on the floor for a prolonged period of time - CPK trending down with IVF hydration. -   4. Nicotine Abuse - Continue nicotine patch - recommend cessation  5. Hypertension Patient has elevated blood pressure we'll start her on metoprolol 25 mg by mouth twice a day. She is also tachycardic which should be helped with metoprolol  Psychiatry following the patient to evaluate decision-making capacity   Code Status: full Family Communication: Spoke with patient and brother Rhea Bleacher [phone]) Disposition Plan: SNF   Consultants:  Neurology  Psychiatry  Procedures:  MRI/CT of head  Antibiotics:  None  HPI/Subjective: No new complaints reported. Patient lying in the bed and answers questions appropriately  Objective: Filed Vitals:   04/12/12 0200 04/12/12 0536 04/12/12 1005 04/12/12 1424  BP: 147/91 141/97 144/106 150/110  Pulse: 108  110 120 118  Temp: 98.2 F (36.8 C) 98 F (36.7 C) 97.3 F (36.3 C) 98.7 F (37.1 C)  TempSrc: Oral Oral Oral Oral  Resp: 19 20 20 20   Height:      Weight:      SpO2: 98% 98% 99% 99%    Intake/Output Summary (Last 24 hours) at 04/12/12 1718 Last data filed at 04/12/12 1500  Gross per 24 hour  Intake    240 ml  Output      0 ml  Net    240 ml   Filed Weights   04/05/12 1206  Weight: 79.379 kg (175 lb)    Exam:   General:  Pt in NAD, Alert and Awake  Cardiovascular: RRR, No mrg  Respiratory: CTA BL, no wheezes  Abdomen: soft, NT, Nondistended  Neuro: Pt has right upper and lower extremity weakness when compared to the left, answers questions appropriately  Data Reviewed: Basic Metabolic Panel:  Lab 04/07/12 5621 04/06/12 1054  NA 135 --  K 3.5 --  CL 100 --  CO2 26 --  GLUCOSE 196* --  BUN 12 --  CREATININE 0.44* --  CALCIUM 9.3 --  MG -- 2.0  PHOS -- --   Liver Function Tests: No results found for this basename: AST:5,ALT:5,ALKPHOS:5,BILITOT:5,PROT:5,ALBUMIN:5 in the last 168 hours No results found for this basename: LIPASE:5,AMYLASE:5 in the last 168 hours No results found for this basename: AMMONIA:5 in the last 168 hours CBC: No results found for this basename: WBC:5,NEUTROABS:5,HGB:5,HCT:5,MCV:5,PLT:5 in the last 168 hours Cardiac Enzymes:  Lab 04/12/12 0625 04/11/12 0505 04/10/12 0618 04/09/12 0656 04/08/12 0550  CKTOTAL 108 197* 445* 187* 201*  CKMB -- -- -- -- --  CKMBINDEX -- -- -- -- --  TROPONINI -- -- -- -- --   BNP (last 3 results) No results found for this basename: PROBNP:3 in the last 8760 hours CBG: No results found for this basename: GLUCAP:5 in the last 168 hours  No results found for this or any previous visit (from the past 240 hour(s)).   Studies: No results found.  Scheduled Meds:    . aspirin  325 mg Oral Daily  . enoxaparin  40 mg Subcutaneous Q24H  . metoprolol tartrate  25 mg Oral BID  . nicotine  21 mg  Transdermal Daily   Continuous Infusions:    . sodium chloride 100 mL/hr at 04/06/12 1057    Principal Problem:  *Rhabdomyolysis Active Problems:  Stroke  Hypokalemia  Nicotine abuse    Time spent: > 25 minutes    [facility]  [facility] Pager [phone]. If 8PM-8AM, please contact night-coverage at [email or web address], password [facility] 04/12/2012, 5:18 PM  LOS: 7 days

## 2012-04-12 NOTE — Progress Notes (Signed)
Progress Note Patient Identification:  Tami Nolan Date of Evaluation:  04/12/2012 Reason for Consult:  Evaluate capacity  Referring Provider: Dr. Sharl Ma  History of Present Illness: Pt was found down after she was missing for two days.  She has weakness on right side.  She refused to go to the hospital IVC papers were then served to keep her in the hospital for necessary treatment    Past Psychiatric History:Pt has dysarthria, an internal capsule infarct per MRI and has a stroke which may account for her lack of interaction.   Past Medical History:     Past Medical History  Diagnosis Date  . Hepatitis     ETOH related   . ETOH abuse   . Hx of abnormal cervical Pap smear 1997  . Colon polyps 1997  . Genital warts   . Multiple sclerosis        Past Surgical History  Procedure Date  . Bilatetral tubal ligation     Allergies: No Known Allergies  Current Medications:  Prior to Admission medications   Medication Sig Start Date End Date Taking? Authorizing Provider  cyclobenzaprine (FLEXERIL) 10 MG tablet Take 10 mg by mouth 2 (two) times daily.   Yes Historical Provider, MD  gabapentin (NEURONTIN) 600 MG tablet Take 1,200 mg by mouth 3 (three) times daily.    Yes Historical Provider, MD  HYDROcodone-acetaminophen (NORCO/VICODIN) 5-325 MG per tablet Take 1 tablet by mouth every 4 (four) hours as needed. Pain   Yes Historical Provider, MD  ibuprofen (ADVIL,MOTRIN) 800 MG tablet Take 800 mg by mouth 2 (two) times daily.   Yes Historical Provider, MD  loratadine (CLARITIN) 10 MG tablet Take 10 mg by mouth daily.   Yes Historical Provider, MD  montelukast (SINGULAIR) 10 MG tablet Take 10 mg by mouth at bedtime.     Yes Historical Provider, MD  Multiple Vitamin (MULTIVITAMIN WITH MINERALS) TABS Take 1 tablet by mouth daily.   Yes Historical Provider, MD  simvastatin (ZOCOR) 40 MG tablet Take 40 mg by mouth at bedtime.     Yes Historical Provider, MD  trazodone (DESYREL) 300 MG tablet  Take 300 mg by mouth at bedtime.   Yes Historical Provider, MD  venlafaxine (EFFEXOR-XR) 150 MG 24 hr capsule Take 150 mg by mouth daily.     Yes Historical Provider, MD  venlafaxine (EFFEXOR-XR) 75 MG 24 hr capsule Take 75 mg by mouth daily.     Yes Historical Provider, MD    Social History:    reports that she has been smoking Cigarettes.  She has a 15 pack-year smoking history. She has never used smokeless tobacco. She reports that she does not drink alcohol or use illicit drugs.   Family History:    Family History  Problem Relation Age of Onset  . Coronary artery disease Brother   . Diabetes Brother   . Hypertension Brother   . Hypertension Mother   . Diabetes Mother   . Hypertension Sister   . 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  AXIS IV economic problems, other psychosocial or environmental problems, problems related to social environment, problems with access to health care services and inaability to care for self  AXIS V 41-50 serious symptoms   Assessment/Plan:  Discussed with Dr. Sharl Ma Pt is seen twice today.  Earlier, pt responded to name, to the RN but refused to open her eyes.  Later, she is awake and says she is willing to go the the SNF.  She is unable to give today's date.  She says the names of her children.    She understands she needs to get help at [facility].      She is  unable to participate in formal mental status exam.  She has occasional eye contact.  Her answers are unclear due to low volume and stuttering; but she repeats her answer until it is understood.  She is asked twice if she understands she needs to go to SNF for strength and walking physical therapy.  She does not have the communication skills or concentration to participate in mental status examination.  She does not state today's date.  She does demonstrate she hears and understands questions.  She is aware that is is very difficult to more in bed.  She demonstrates ability to follow commands: moving isolated right or left legs/arms.  Her left arm does not move, appears to have distfigured  L hand and fingers  RN says she has been capricious about when she will respond and cooperate with certain activities.  RECOMMENDATION:  1. Pt has awareness of her need to go to SNF and agrees to go.-"yes" 2  Pt demonstrated inability to concentrate and participate in mental status exam. 3.  Awareness and function is limited ; minimal understanding is apearrant. She demonstrates  4.  Pt has limited capacity but deonstrated she understands and agrees to go to SNF  Tami Nolan J. Ferol Luz, MD Psychiatrist  04/12/2012  11:51 AM

## 2012-04-13 NOTE — Progress Notes (Signed)
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 - We are to continue aspirin 325 mg po daily - statin was held on admission given patients rhabdomyolysis. Should be continued once patient ready for discharge - Cardiac MRI negative for clot,  ASD or VSD. - smoking cessation counseling - Patient with resultant right hemiplegia, right facial weakness, right neurologic neglect. - Disposition will need to be addressed as patient at this point not felt to be safe to transition home.  Nursing reports that patient is debilitated needing 2 person assists to transfer out of bed.  May not return back to home per recommendations of adult protective services (house was too dirty) - Psychiatry  Following.  Delirium Secondary to above Patient is slowly improving, but not back to her baseline.  . Hypokalemia - Magnesium levels within normal limits - Improved after oral repletion.  . Rhabdomyolysis - given history probably related to being on the floor for a prolonged period of time - CPK trending down with IVF hydration. -    Nicotine Abuse - Continue nicotine patch - recommend cessation  . Hypertension Patient has elevated blood pressure we'll start her on metoprolol 25 mg by mouth twice a day. She is also tachycardic which should be helped with metoprolol  Psych has seen the patient , and she does not have decision making capacity   Code Status: full Family Communication: Spoke with patient and brother Rhea Bleacher [phone]) Disposition Plan: SNF   Consultants:  Neurology  Psychiatry  Procedures:  MRI/CT of head  Antibiotics:  None  HPI/Subjective: No new complaints reported. Patient lying in the bed and answers questions appropriately  Objective: Filed Vitals:   04/13/12  0154 04/13/12 0556 04/13/12 1000 04/13/12 1344  BP: 170/96 160/98 136/119 149/102  Pulse:  117 113 95  Temp:  97.8 F (36.6 C) 98.1 F (36.7 C) 98 F (36.7 C)  TempSrc:  Oral Oral Oral  Resp:  18 18 18   Height:      Weight:      SpO2:  98% 99% 100%    Intake/Output Summary (Last 24 hours) at 04/13/12 1449 Last data filed at 04/13/12 1300  Gross per 24 hour  Intake   1320 ml  Output      0 ml  Net   1320 ml   Filed Weights   04/05/12 1206  Weight: 79.379 kg (175 lb)    Exam:   General:  Pt in NAD, Alert and Awake  Cardiovascular: RRR, No mrg  Respiratory: CTA BL, no wheezes  Abdomen: soft, NT, Nondistended  Neuro: Pt has right upper and lower extremity weakness when compared to the left, answers questions appropriately  Data Reviewed: Basic Metabolic Panel:  Lab 04/07/12 8413  NA 135  K 3.5  CL 100  CO2 26  GLUCOSE 196*  BUN 12  CREATININE 0.44*  CALCIUM 9.3  MG --  PHOS --   Liver Function Tests: No results found for this basename: AST:5,ALT:5,ALKPHOS:5,BILITOT:5,PROT:5,ALBUMIN:5 in the last 168 hours No results found for this basename: LIPASE:5,AMYLASE:5 in the last 168 hours No results found for this basename: AMMONIA:5 in the last 168 hours CBC: No results found for this basename: WBC:5,NEUTROABS:5,HGB:5,HCT:5,MCV:5,PLT:5 in the last 168 hours Cardiac Enzymes:  Lab 04/13/12 0614 04/12/12 0625 04/11/12 0505 04/10/12 0618 04/09/12 0656  CKTOTAL 103 108 197* 445*  187*  CKMB -- -- -- -- --  CKMBINDEX -- -- -- -- --  TROPONINI -- -- -- -- --   BNP (last 3 results) No results found for this basename: PROBNP:3 in the last 8760 hours CBG: No results found for this basename: GLUCAP:5 in the last 168 hours  No results found for this or any previous visit (from the past 240 hour(s)).   Studies: No results found.  Scheduled Meds:    . aspirin  325 mg Oral Daily  . enoxaparin  40 mg Subcutaneous Q24H  . metoprolol tartrate  25 mg Oral BID  .  nicotine  21 mg Transdermal Daily   Continuous Infusions:    . sodium chloride 100 mL/hr at 04/06/12 1057    Principal Problem:  *Rhabdomyolysis Active Problems:  Stroke  Hypokalemia  Nicotine abuse    Time spent: > 25 minutes    [facility]  [facility] Pager [phone]. If 8PM-8AM, please contact night-coverage at [email or web address], password [facility] 04/13/2012, 2:49 PM  LOS: 8 days

## 2012-04-14 DIAGNOSIS — M6282 Rhabdomyolysis: Secondary | ICD-10-CM

## 2012-04-14 MED ORDER — HYDRALAZINE HCL 25 MG PO TABS
25.0000 mg | ORAL_TABLET | Freq: Four times a day (QID) | ORAL | Status: DC | PRN
  Administered 2012-04-14: 25 mg via ORAL
  Filled 2012-04-14: qty 1

## 2012-04-14 MED ORDER — BISACODYL 10 MG RE SUPP
10.0000 mg | Freq: Once | RECTAL | Status: AC
  Administered 2012-04-15: 10 mg via RECTAL
  Filled 2012-04-14: qty 1

## 2012-04-14 MED ORDER — LISINOPRIL 5 MG PO TABS
5.0000 mg | ORAL_TABLET | Freq: Every day | ORAL | Status: DC
  Administered 2012-04-14 – 2012-04-22 (×9): 5 mg via ORAL
  Filled 2012-04-14 (×9): qty 1

## 2012-04-14 NOTE — Progress Notes (Signed)
Pt was restless, calling out, removing gown,  Most of shift.  This afternoon after family members arrived, she is quieter, no longer calling out, and has kept her gown on.  Medicated for high blood pressure at 1645 .

## 2012-04-14 NOTE — Progress Notes (Signed)
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 - We are to continue aspirin 325 mg po daily - statin was held on admission given patients rhabdomyolysis. Should be continued once patient ready for discharge - Cardiac MRI negative for clot,  ASD or VSD. - smoking cessation counseling - Patient with resultant right hemiplegia, right facial weakness, right neurologic neglect. - Disposition : patient will go to SNF - Psychiatry  Following.  Delirium Secondary to above Patient is slowly improving, but not back to her baseline.  . Hypokalemia - Magnesium levels within normal limits - Improved after oral repletion.  . Rhabdomyolysis - given history probably related to being on the floor for a prolonged period of time - CPK trending down with IVF hydration. -    Nicotine Abuse - Continue nicotine patch - recommend cessation  . Hypertension Patient has elevated blood pressure we'll continue her on metoprolol 25 mg by mouth twice a day. She is also tachycardic which should be helped with metoprolol  Psych has seen the patient , and she does not have decision making capacity   Code Status: full Family Communication: Spoke with patient and brother Rhea Bleacher [phone]) Disposition Plan: SNF   Consultants:  Neurology  Psychiatry  Procedures:  MRI/CT of head  Antibiotics:  None  HPI/Subjective: No new complaints reported. Patient lying in the bed and answers questions appropriately  Objective: Filed Vitals:   04/13/12 1344 04/13/12 1741 04/13/12 2259 04/14/12 0622  BP: 149/102 149/101 143/98 162/94  Pulse: 95 100 105 107  Temp: 98 F (36.7 C) 98 F (36.7 C) 97.5 F (36.4 C) 97.3 F (36.3 C)  TempSrc: Oral Oral Oral Oral  Resp: 18 18 20 18   Height:      Weight:       SpO2: 100% 98% 93% 100%    Intake/Output Summary (Last 24 hours) at 04/14/12 0926 Last data filed at 04/13/12 1700  Gross per 24 hour  Intake    960 ml  Output      0 ml  Net    960 ml   Filed Weights   04/05/12 1206  Weight: 79.379 kg (175 lb)    Exam:   General:  Pt in NAD, Alert and Awake  Cardiovascular: RRR, No mrg  Respiratory: CTA BL, no wheezes  Abdomen: soft, NT, Nondistended  Neuro: Pt has right upper and lower extremity weakness when compared to the left, answers questions appropriately  Data Reviewed: Basic Metabolic Panel: No results found for this basename: NA:5,K:5,CL:5,CO2:5,GLUCOSE:5,BUN:5,CREATININE:5,CALCIUM:5,MG:5,PHOS:5 in the last 168 hours Liver Function Tests: No results found for this basename: AST:5,ALT:5,ALKPHOS:5,BILITOT:5,PROT:5,ALBUMIN:5 in the last 168 hours No results found for this basename: LIPASE:5,AMYLASE:5 in the last 168 hours No results found for this basename: AMMONIA:5 in the last 168 hours CBC: No results found for this basename: WBC:5,NEUTROABS:5,HGB:5,HCT:5,MCV:5,PLT:5 in the last 168 hours Cardiac Enzymes:  Lab 04/14/12 0545 04/13/12 0614 04/12/12 0625 04/11/12 0505 04/10/12 0618  CKTOTAL 115 103 108 197* 445*  CKMB -- -- -- -- --  CKMBINDEX -- -- -- -- --  TROPONINI -- -- -- -- --   BNP (last 3 results) No results found for this basename: PROBNP:3 in the last 8760 hours CBG: No results found for this basename: GLUCAP:5 in the last 168 hours  No results found for this or any previous visit (from  the past 240 hour(s)).   Studies: No results found.  Scheduled Meds:    . aspirin  325 mg Oral Daily  . enoxaparin  40 mg Subcutaneous Q24H  . metoprolol tartrate  25 mg Oral BID  . nicotine  21 mg Transdermal Daily   Continuous Infusions:    . sodium chloride 100 mL/hr at 04/06/12 1057    Principal Problem:  *Rhabdomyolysis Active Problems:  Stroke  Hypokalemia  Nicotine abuse    Time spent: >     [facility]  [facility] Pager [phone]. If 8PM-8AM, please contact night-coverage at [email or web address], password [facility] 04/14/2012, 9:26 AM  LOS: 9 days

## 2012-04-15 LAB — CBC
HCT: 40.8 % (ref 36.0–46.0)
Hemoglobin: 15 g/dL (ref 12.0–15.0)
MCHC: 36.8 g/dL — ABNORMAL HIGH (ref 30.0–36.0)
WBC: 10.7 10*3/uL — ABNORMAL HIGH (ref 4.0–10.5)

## 2012-04-15 LAB — BASIC METABOLIC PANEL
BUN: 18 mg/dL (ref 6–23)
Chloride: 102 mEq/L (ref 96–112)
Glucose, Bld: 154 mg/dL — ABNORMAL HIGH (ref 70–99)
Potassium: 3.8 mEq/L (ref 3.5–5.1)

## 2012-04-15 LAB — CK: Total CK: 131 U/L (ref 7–177)

## 2012-04-15 NOTE — Progress Notes (Signed)
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 - We are to continue aspirin 325 mg po daily - statin was held on admission given patients rhabdomyolysis. Should be continued once patient ready for discharge - Cardiac MRI negative for clot,  ASD or VSD. - smoking cessation counseling - Patient with resultant right hemiplegia, right facial weakness, right neurologic neglect. - Disposition : patient will go to SNF - Psychiatry  Following.  2.Delirium Secondary to above Patient is slowly improving, but not back to her baseline.  3.Hypokalemia - Magnesium levels within normal limits - Improved after oral repletion.  4.Rhabdomyolysis - given history probably related to being on the floor for a prolonged period of time - CPK trending down with IVF hydration. -   5. Nicotine Abuse - Continue nicotine patch - recommend cessation  6. Hypertension Patient has elevated blood pressure we'll continue her on metoprolol 25 mg by mouth twice a day. She is also tachycardic which should be helped with metoprolol  Psych has seen the patient , and she does not have decision making capacity   Code Status: full Family Communication: Spoke with patient and brother Rhea Bleacher [phone]) Disposition Plan: SNF   Consultants:  Neurology  Psychiatry  Procedures:  MRI/CT of head  Antibiotics:  None  HPI/Subjective: No new complaints reported. Patient lying in the bed and answers questions appropriately  Objective: Filed Vitals:   04/14/12 2103 04/15/12 0157 04/15/12 0713 04/15/12 1054  BP: 127/96 134/94 140/89 119/84  Pulse: 112 96 111 98  Temp: 98.2 F (36.8 C) 98 F (36.7 C) 98 F (36.7 C) 97.3 F (36.3 C)  TempSrc: Oral Axillary Axillary Oral  Resp: 17 18 18 18   Height:        Weight:      SpO2: 100% 98% 94% 100%    Intake/Output Summary (Last 24 hours) at 04/15/12 1247 Last data filed at 04/15/12 0858  Gross per 24 hour  Intake    270 ml  Output      0 ml  Net    270 ml   Filed Weights   04/05/12 1206  Weight: 79.379 kg (175 lb)    Exam:   General:  Pt in NAD, Alert and Awake  Cardiovascular: RRR, No mrg  Respiratory: CTA BL, no wheezes  Abdomen: soft, NT, Nondistended  Neuro: Pt has right upper and lower extremity weakness when compared to the left, answers questions appropriately  Data Reviewed: Basic Metabolic Panel: No results found for this basename: NA:5,K:5,CL:5,CO2:5,GLUCOSE:5,BUN:5,CREATININE:5,CALCIUM:5,MG:5,PHOS:5 in the last 168 hours Liver Function Tests: No results found for this basename: AST:5,ALT:5,ALKPHOS:5,BILITOT:5,PROT:5,ALBUMIN:5 in the last 168 hours No results found for this basename: LIPASE:5,AMYLASE:5 in the last 168 hours No results found for this basename: AMMONIA:5 in the last 168 hours CBC: No results found for this basename: WBC:5,NEUTROABS:5,HGB:5,HCT:5,MCV:5,PLT:5 in the last 168 hours Cardiac Enzymes:  Lab 04/15/12 0650 04/14/12 0545 04/13/12 0614 04/12/12 0625 04/11/12 0505  CKTOTAL 131 115 103 108 197*  CKMB -- -- -- -- --  CKMBINDEX -- -- -- -- --  TROPONINI -- -- -- -- --   BNP (last 3 results) No results found for this basename: PROBNP:3 in the last 8760 hours CBG: No results found for this basename: GLUCAP:5 in the last 168 hours  No results found for this or any previous visit (from the  past 240 hour(s)).   Studies: No results found.  Scheduled Meds:    . aspirin  325 mg Oral Daily  . [COMPLETED] bisacodyl  10 mg Rectal Once  . enoxaparin  40 mg Subcutaneous Q24H  . lisinopril  5 mg Oral Daily  . metoprolol tartrate  25 mg Oral BID  . nicotine  21 mg Transdermal Daily   Continuous Infusions:    . sodium chloride 100 mL/hr at 04/06/12 1057    Principal Problem:   *Rhabdomyolysis Active Problems:  Stroke  Hypokalemia  Nicotine abuse    Time spent: >3minutes    [facility]  [facility] Pager [phone]. If 8PM-8AM, please contact night-coverage at [email or web address], password [facility] 04/15/2012, 12:47 PM  LOS: 10 days

## 2012-04-15 NOTE — Clinical Social Work Note (Signed)
Clinical Social Work  CSW sent additional notes to [facility], per request.   Pt currently only has one "considering" bed offer in [facility]. Currently, pt's clinicals are being reviewed by DON. CSW will continue to follow.   Dede Query, MSW, [facility]ors [phone]

## 2012-04-15 NOTE — Progress Notes (Addendum)
Physical Therapy Treatment Patient Details Name: Tami Nolan MRN: [id number] DOB: 12/14/1960 Today's Date: 04/15/2012 Time: 5284-1324 PT Time Calculation (min): 15 min  PT Assessment / Plan / Recommendation Comments on Treatment Session  Pt. had minimal active participation during todays session with dependent positioning at EOB and support while at EOB. Pt. given max encouragement to participate; Pt keeping eyes closed &  inconsistently answering questions with one word however, Pt. able to speak appropriately to a family/friend on telephone during session.     Follow Up Recommendations        Does the patient have the potential to tolerate intense rehabilitation  No, Recommend SNF  Barriers to Discharge        Equipment Recommendations  3 in 1 bedside comode;Rolling walker with 5" wheels    Recommendations for Other Services    Frequency Min 3X/week   Plan Discharge plan remains appropriate;Frequency remains appropriate    Precautions / Restrictions Precautions Precautions: Fall Restrictions Weight Bearing Restrictions: No   Pertinent Vitals/Pain Patient did not rate any pain at this time.    Mobility  Bed Mobility Bed Mobility: Left Sidelying to Sit;Sitting - Scoot to Edge of Bed;Sit to Sidelying Left Left Sidelying to Sit: 1: +2 Total assist;HOB flat Left Sidelying to Sit: Patient Percentage: 0% Sitting - Scoot to Edge of Bed: 1: +2 Total assist Sitting - Scoot to Edge of Bed: Patient Percentage: 0% Sit to Sidelying Left: 1: +2 Total assist;HOB flat Sit to Sidelying Left: Patient Percentage: 0% Details for Bed Mobility Assistance: (A) for all components of transitioning.  Once patient was positioned in sitting at EOB she would repeatedly try to lay back down. (A) & encouragement to sit upright but pt continuously attempting to "fall" to Lt side.  Pt not engaging in any aspect of sitting EOB.   Transfers Transfers: Not assessed Ambulation/Gait Ambulation/Gait  Assistance: Not tested (comment) Wheelchair Mobility Wheelchair Mobility: No      PT Goals Acute Rehab PT Goals PT Goal Formulation: Patient unable to participate in goal setting Time For Goal Achievement: 04/20/12 Potential to Achieve Goals: Fair Pt will go Supine/Side to Sit: with supervision PT Goal: Supine/Side to Sit - Progress: Not progressing Pt will go Sit to Supine/Side: with supervision PT Goal: Sit to Supine/Side - Progress: Not progressing Pt will go Sit to Stand: with min assist Pt will go Stand to Sit: with min assist Pt will Transfer Bed to Chair/Chair to Bed: with min assist Pt will Ambulate: 51 - 150 feet;with min assist;with least restrictive assistive device  Visit Information  Last PT Received On: 04/15/12 Assistance Needed: +2 PT/OT Co-Evaluation/Treatment: Yes    Subjective Data  Subjective: "Yes" answered to if she would like to get up and walk   Cognition  Overall Cognitive Status: Impaired Area of Impairment: Attention;Following commands Arousal/Alertness: Lethargic Behavior During Session: Lethargic Current Attention Level: Other (comment) (Unfocused) Attention - Other Comments: Eyes remained closed throughout session Following Commands: Follows one step commands inconsistently Safety/Judgement: Decreased safety judgement for tasks assessed Cognition - Other Comments: Pt. did not respond to orientation questions as she has in previous sessions. Pt. given max encouragement to arouse, but did not hold eyes open for more than ~2seconds.    Balance  Balance Balance Assessed: Yes Static Sitting Balance Static Sitting - Balance Support: Feet supported;Left upper extremity supported Static Sitting - Level of Assistance: 1: +2 Total assist Static Sitting - Comment/# of Minutes: 5, Pt. with strong lean to the left throughout  sitting at EOB. Pt. encouraged throughout sitting at EOB to try and maintain her balance.  End of Session PT - End of  Session Activity Tolerance: Other (comment) (Minimal active engagement) Patient left: in chair;with call bell/phone within reach;with restraints reapplied (RN stated order for all bed rails to be in place) Nurse Communication: Mobility status;Other (comment) (Confirmation of bed rail status/MD order for all rails)    STROUD, LAURA, SPTA 04/15/2012, 9:22 AM

## 2012-04-15 NOTE — Progress Notes (Signed)
Occupational Therapy Treatment Patient Details Name: Tami Nolan MRN: [id number] DOB: 1961/03/20 Today's Date: 04/15/2012 Time: 4098-1191 OT Time Calculation (min): 15 min  OT Assessment / Plan / Recommendation Comments on Treatment Session Pt continues to participate minimally in therapy sessions.  Total dependence in ADL and +2 for mobility.      Follow Up Recommendations  SNF    Barriers to Discharge       Equipment Recommendations  3 in 1 bedside comode;Rolling walker with 5" wheels    Recommendations for Other Services    Frequency Min 2X/week   Plan Discharge plan remains appropriate    Precautions / Restrictions Precautions Precautions: Fall   Pertinent Vitals/Pain No indicators of pain.    ADL  ADL Comments: RN reports total assist for bathing, dressing, and eating.  Pt stated she wanted to ambulate, but once assisted to EOB, attempted to return to sidelying.  Received phone call while therapists in room, responded minimally but appropriately to questions and comments of caller.    OT Diagnosis:    OT Problem List:   OT Treatment Interventions:     OT Goals Acute Rehab OT Goals OT Goal Formulation: Patient unable to participate in goal setting Time For Goal Achievement: 04/20/12 Potential to Achieve Goals: Fair ADL Goals Pt Will Perform Grooming: with supervision;Unsupported;Sitting, chair;Sitting, edge of bed;Other (comment) Pt Will Perform Upper Body Bathing: with supervision;Sitting, chair;Sitting, edge of bed;Unsupported;Other (comment) Pt Will Perform Lower Body Bathing: with supervision;Sit to stand from chair;Sit to stand from bed;Unsupported;Other (comment) Pt Will Transfer to Toilet: with supervision;Stand pivot transfer;with DME;3-in-1 Pt Will Perform Toileting - Clothing Manipulation: with supervision;Sitting on 3-in-1 or toilet Pt Will Perform Toileting - Hygiene: with supervision;Sit to stand from 3-in-1/toilet Miscellaneous OT  Goals Miscellaneous OT Goal #1: Pt will demonstrate sustained attention during 75% of ADL tasks with min verbal cueing. OT Goal: Miscellaneous Goal #1 - Progress: Not progressing Miscellaneous OT Goal #2: Pt will perform static sitting balance task with supervision EOB >5 min in prep for ADLs. OT Goal: Miscellaneous Goal #2 - Progress: Not progressing  Visit Information  Last OT Received On: 04/15/12 Assistance Needed: +2 PT/OT Co-Evaluation/Treatment: Yes    Subjective Data      Prior Functioning       Cognition  Overall Cognitive Status: Impaired Area of Impairment: Attention;Memory;Following commands;Safety/judgement;Awareness of errors;Problem solving;Awareness of deficits;Executive functioning Arousal/Alertness: Lethargic Behavior During Session: Flat affect Current Attention Level: Focused Attention - Other Comments: kept eyes closed Following Commands: Follows one step commands inconsistently Safety/Judgement: Decreased safety judgement for tasks assessed    Mobility  Shoulder Instructions Bed Mobility Bed Mobility: Left Sidelying to Sit;Sit to Sidelying Left;Sitting - Scoot to Edge of Bed Left Sidelying to Sit: 1: +2 Total assist;HOB flat Left Sidelying to Sit: Patient Percentage: 0% Sitting - Scoot to Edge of Bed: 1: +2 Total assist Sitting - Scoot to Edge of Bed: Patient Percentage: 0% Sit to Sidelying Left: 1: +2 Total assist;HOB flat Sit to Sidelying Left: Patient Percentage: 0% Details for Bed Mobility Assistance: Repeated attempts to lay back down once sitting EOB. Transfers Transfers: Not assessed       Exercises      Balance Balance Balance Assessed: Yes Static Sitting Balance Static Sitting - Balance Support: Feet supported;Left upper extremity supported Static Sitting - Level of Assistance: 1: +2 Total assist Static Sitting - Comment/# of Minutes: 5   End of Session OT - End of Session Activity Tolerance: Patient limited by fatigue Patient  left: in bed;with call bell/phone within reach Nurse Communication: Mobility status  GO     Evern Bio 04/15/2012, 8:45 AM [phone]

## 2012-04-16 MED ORDER — HALOPERIDOL LACTATE 5 MG/ML IJ SOLN
INTRAMUSCULAR | Status: AC
  Filled 2012-04-16: qty 1

## 2012-04-16 MED ORDER — HALOPERIDOL 2 MG PO TABS
2.0000 mg | ORAL_TABLET | Freq: Three times a day (TID) | ORAL | Status: DC | PRN
  Administered 2012-04-17 – 2012-04-20 (×5): 2 mg via ORAL
  Filled 2012-04-16 (×5): qty 1

## 2012-04-16 MED ORDER — LORAZEPAM 1 MG PO TABS
2.0000 mg | ORAL_TABLET | ORAL | Status: DC | PRN
  Administered 2012-04-16 – 2012-04-22 (×13): 2 mg via ORAL
  Filled 2012-04-16 (×14): qty 2

## 2012-04-16 MED ORDER — LORAZEPAM 2 MG/ML IJ SOLN
0.5000 mg | INTRAMUSCULAR | Status: DC | PRN
  Filled 2012-04-16: qty 1

## 2012-04-16 NOTE — Progress Notes (Signed)
[facility] PROGRESS NOTE  Tami Nolan WUJ:[id number] DOB: 12-26-1960 DOA: 04/05/2012 PCP: [name], MD  Interval history 51 year old female who was transferred from any hospital after patient was found on the floor for unknown amount of time. Patient was having difficulty to get up due to weakness of the right side so patient was transferred to [facility] for further evaluation. Patient was seen by neurology and MRI confirmed left internal capsule infarct. Patient also was found to have rhabdo my lysis with CK slowly improving. Patient continued to be confused in the hospital, and delirium was thought to be secondary to the stroke and metabolic derangements. Psych was consulted and deemed patient having the capacity to make the decisions. At this time patient's medical issues are stable and she continues to be pleasantly confused though the confusion is improving. Patient sister and patient agreed to go to skilled nursing facility and they're awaiting bed.   Assessment/Plan:  1, Stroke Imaging confirms a left internal capsule infarct - Consulted Neuro and they have signed off. - We are to continue aspirin 325 mg po daily - statin was held on admission given patients rhabdomyolysis. Should be continued once patient ready for discharge - Cardiac MRI negative for clot,  ASD or VSD. - smoking cessation counseling - Patient with resultant right hemiplegia, right facial weakness, right neurologic neglect. - Disposition : patient will go to SNF   2.Delirium Secondary to above Patient is slowly improving, but not back to her baseline.  3.Hypokalemia - Magnesium levels within normal limits - Improved after oral repletion.  4.Rhabdomyolysis - given history probably related to being on the floor for a prolonged period of time - CPK trending down with IVF hydration. -   5. Nicotine Abuse - Continue nicotine patch - recommend cessation  6. Hypertension Patient has elevated  blood pressure we'll continue her on metoprolol 25 mg by mouth twice a day. She is also tachycardic which should be helped with metoprolol  Psych has seen the patient , and she does not have decision making capacity   Code Status: full Family Communication: Spoke with patient and brother Rhea Bleacher [phone]) Disposition Plan: SNF   Consultants:  Neurology  Psychiatry  Procedures:  Carotid Doppler  Antibiotics:  None  HPI/Subjective: No new complaints reported. Patient lying in the bed and answers questions appropriately  Objective: Filed Vitals:   04/15/12 2113 04/16/12 0151 04/16/12 1047 04/16/12 1354  BP: 121/78 124/81 128/92 104/75  Pulse: 102 87 117 89  Temp: 97.5 F (36.4 C) 98.2 F (36.8 C) 98.3 F (36.8 C) 98 F (36.7 C)  TempSrc: Oral Oral    Resp: 18 16 20 20   Height:      Weight:      SpO2: 100% 100% 100% 99%    Intake/Output Summary (Last 24 hours) at 04/16/12 1407 Last data filed at 04/15/12 1816  Gross per 24 hour  Intake    360 ml  Output      0 ml  Net    360 ml   Filed Weights   04/05/12 1206  Weight: 79.379 kg (175 lb)    Exam:   General:  Pt in NAD, Alert and Awake  Cardiovascular: RRR, No mrg  Respiratory: CTA BL, no wheezes  Abdomen: soft, NT, Nondistended  Neuro: Pt has right upper and lower extremity weakness when compared to the left, answers questions appropriately  Data Reviewed: Basic Metabolic Panel:  Lab 04/15/12 7846  NA 139  K 3.8  CL  102  CO2 24  GLUCOSE 154*  BUN 18  CREATININE 0.55  CALCIUM 9.4  MG --  PHOS --   Liver Function Tests: No results found for this basename: AST:5,ALT:5,ALKPHOS:5,BILITOT:5,PROT:5,ALBUMIN:5 in the last 168 hours No results found for this basename: LIPASE:5,AMYLASE:5 in the last 168 hours No results found for this basename: AMMONIA:5 in the last 168 hours CBC:  Lab 04/15/12 1302  WBC 10.7*  NEUTROABS --  HGB 15.0  HCT 40.8  MCV 78.3  PLT 382   Cardiac  Enzymes:  Lab 04/16/12 0644 04/15/12 0650 04/14/12 0545 04/13/12 0614 04/12/12 0625  CKTOTAL 119 131 115 103 108  CKMB -- -- -- -- --  CKMBINDEX -- -- -- -- --  TROPONINI -- -- -- -- --   BNP (last 3 results) No results found for this basename: PROBNP:3 in the last 8760 hours CBG: No results found for this basename: GLUCAP:5 in the last 168 hours  No results found for this or any previous visit (from the past 240 hour(s)).   Studies: No results found.  Scheduled Meds:    . aspirin  325 mg Oral Daily  . enoxaparin  40 mg Subcutaneous Q24H  . lisinopril  5 mg Oral Daily  . metoprolol tartrate  25 mg Oral BID  . nicotine  21 mg Transdermal Daily   Continuous Infusions:    . sodium chloride 100 mL/hr at 04/06/12 1057    Principal Problem:  *Rhabdomyolysis Active Problems:  Stroke  Hypokalemia  Nicotine abuse    Time spent: > 30 minutes    [facility] S  [facility] Pager [phone]. If 8PM-8AM, please contact night-coverage at [email or web address], password [facility] 04/16/2012, 2:07 PM  LOS: 11 days

## 2012-04-17 NOTE — Progress Notes (Signed)
PT Cancellation Note  Patient Details Name: Tami Nolan MRN: [id number] DOB: 08-14-1960   Cancelled Treatment:     Per RN - patient just received Ativan and has been restless all day. RN requesting hold and will re-attempt tomorrow.   Mertie Clause 04/17/2012, 1:28 PM

## 2012-04-17 NOTE — Progress Notes (Signed)
Kaidyn Javid, PTA [phone] 04/17/2012  

## 2012-04-17 NOTE — Progress Notes (Signed)
[facility] PROGRESS NOTE  Tami Nolan MVH:[id number] DOB: [birth year] DOA: 04/05/2012 PCP: [name], MD  Interval history 51 year old female who was transferred from any hospital after patient was found on the floor for unknown amount of time. Patient was having difficulty to get up due to weakness of the right side so patient was transferred to [facility] for further evaluation. Patient was seen by neurology and MRI confirmed left internal capsule infarct. Patient also was found to have rhabdo my lysis with CK slowly improving. Patient continued to be confused in the hospital, and delirium was thought to be secondary to the stroke and metabolic derangements. Psych was consulted and deemed patient having the capacity to make the decisions. At this time patient's medical issues are stable and she continues to be pleasantly confused though the confusion is improving. Patient sister and patient agreed to go to skilled nursing facility and they're awaiting bed.   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 - We are to continue aspirin 325 mg po daily - statin was held on admission given patients rhabdomyolysis. Should be continued once patient ready for discharge - Cardiac MRI negative for clot,  ASD or VSD. - pt received smoking cessation counseling - Patient with resultant right hemiplegia, right facial weakness, right neurologic neglect. - Disposition : patient awaiting SNF   2.Delirium Secondary to above Patient is slowly improving, but not back to her baseline.  3.Hypokalemia - Magnesium levels within normal limits - Improved after oral repletion.  4.Rhabdomyolysis - given history probably related to being on the floor for a prolonged period of time - resolved, CPK now normalized -   5. Nicotine Abuse - Continue nicotine patch - smoking cessation recommended  6.  Hypertension Patient has elevated blood pressure we'll continue her on metoprolol 25 mg by mouth twice a day. She is also tachycardic which should be helped with metoprolol  Psych saw the patient , and on 11/8 stated that she has limited decision making capacity, and that she understood and agreed to go to SNF   Code Status: full Family Communication: Spoke with Sister and brother at bedside  Disposition Plan: SNF   Consultants:  Neurology  Psychiatry  Procedures:  Carotid Doppler  Antibiotics:  None  HPI/Subjective: Chart reviewed, denies any complaints reported. Family at bedside-sister feeding her. Objective: Filed Vitals:   04/16/12 2137 04/17/12 0234 04/17/12 0623 04/17/12 1025  BP: 130/85 133/97 113/55 129/91  Pulse: 103 99 112 110  Temp: 97.8 F (36.6 C) 97.3 F (36.3 C)  97.4 F (36.3 C)  TempSrc: Axillary Axillary  Oral  Resp: 18 16 18 18   Height:      Weight:      SpO2: 100% 100% 100% 100%    Intake/Output Summary (Last 24 hours) at 04/17/12 1143 Last data filed at 04/16/12 1858  Gross per 24 hour  Intake    240 ml  Output      0 ml  Net    240 ml   Filed Weights   04/05/12 1206  Weight: 79.379 kg (175 lb)    Exam:   General:  Pt in NAD, Alert and Awake  Cardiovascular: RRR, No mrg  Respiratory: CTA BL, no wheezes  Abdomen: soft, NT, Nondistended  Neuro: Pt has right upper and lower extremity weakness when compared to the left, answers questions appropriately  Data Reviewed: Basic Metabolic Panel:  Lab 04/15/12 8413  NA 139  K 3.8  CL 102  CO2 24  GLUCOSE 154*  BUN 18  CREATININE 0.55  CALCIUM 9.4  MG --  PHOS --   Liver Function Tests: No results found for this basename: AST:5,ALT:5,ALKPHOS:5,BILITOT:5,PROT:5,ALBUMIN:5 in the last 168 hours No results found for this basename: LIPASE:5,AMYLASE:5 in the last 168 hours No results found for this basename: AMMONIA:5 in the last 168 hours CBC:  Lab 04/15/12 1302  WBC 10.7*   NEUTROABS --  HGB 15.0  HCT 40.8  MCV 78.3  PLT 382   Cardiac Enzymes:  Lab 04/17/12 0635 04/16/12 0644 04/15/12 0650 04/14/12 0545 04/13/12 0614  CKTOTAL 139 119 131 115 103  CKMB -- -- -- -- --  CKMBINDEX -- -- -- -- --  TROPONINI -- -- -- -- --   BNP (last 3 results) No results found for this basename: PROBNP:3 in the last 8760 hours CBG: No results found for this basename: GLUCAP:5 in the last 168 hours  No results found for this or any previous visit (from the past 240 hour(s)).   Studies: No results found.  Scheduled Meds:    . aspirin  325 mg Oral Daily  . enoxaparin  40 mg Subcutaneous Q24H  . lisinopril  5 mg Oral Daily  . metoprolol tartrate  25 mg Oral BID  . nicotine  21 mg Transdermal Daily   Continuous Infusions:    . sodium chloride 100 mL/hr at 04/06/12 1057    Principal Problem:  *Rhabdomyolysis Active Problems:  Stroke  Hypokalemia  Nicotine abuse    Time spent:30 minutes    Vernell Townley C  [facility] Pager [phone]. If 8PM-8AM, please contact night-coverage at [email or web address], password [facility] 04/17/2012, 11:43 AM  LOS: 12 days

## 2012-04-18 DIAGNOSIS — G35 Multiple sclerosis: Secondary | ICD-10-CM | POA: Diagnosis not present

## 2012-04-18 LAB — BASIC METABOLIC PANEL
BUN: 11 mg/dL (ref 6–23)
CO2: 26 mEq/L (ref 19–32)
Chloride: 102 mEq/L (ref 96–112)
Creatinine, Ser: 0.47 mg/dL — ABNORMAL LOW (ref 0.50–1.10)
GFR calc Af Amer: >90 mL/min (ref >90)
Glucose, Bld: 153 mg/dL — ABNORMAL HIGH (ref 70–99)

## 2012-04-18 MED ORDER — HALOPERIDOL LACTATE 5 MG/ML IJ SOLN
2.0000 mg | Freq: Four times a day (QID) | INTRAMUSCULAR | Status: DC | PRN
  Administered 2012-04-18 – 2012-04-21 (×2): 2 mg via INTRAMUSCULAR
  Filled 2012-04-18 (×3): qty 1

## 2012-04-18 MED ORDER — ENSURE COMPLETE PO LIQD
237.0000 mL | Freq: Two times a day (BID) | ORAL | Status: DC
  Administered 2012-04-18 – 2012-04-22 (×9): 237 mL via ORAL

## 2012-04-18 NOTE — Progress Notes (Signed)
INITIAL ADULT NUTRITION ASSESSMENT Date: 04/18/2012   Time: 9:45 AM Reason for Assessment: Low Braden  INTERVENTION: Ensure Complete po BID, each supplement provides 350 kcal and 13 grams of protein.   DOCUMENTATION CODES Per approved criteria  -Not Applicable    ASSESSMENT: Female 51 y.o.  Dx: Rhabdomyolysis  Hx:  Past Medical History  Diagnosis Date  . Hepatitis     ETOH related   . ETOH abuse   . Hx of abnormal cervical Pap smear 1997  . Colon polyps 1997  . Genital warts   . Multiple sclerosis    Past Surgical History  Procedure Date  . Bilatetral tubal ligation    Related Meds:  Scheduled Meds:   . aspirin  325 mg Oral Daily  . enoxaparin  40 mg Subcutaneous Q24H  . lisinopril  5 mg Oral Daily  . metoprolol tartrate  25 mg Oral BID  . nicotine  21 mg Transdermal Daily   Continuous Infusions:   . sodium chloride 100 mL/hr at 04/06/12 1057   PRN Meds:.haloperidol, haloperidol lactate, hydrALAZINE, LORazepam, LORazepam, meloxicam, senna-docusate  Ht: 5\' 7"  (170.2 cm)  Wt: 158 lb 6.4 oz (71.85 kg)  Ideal Wt: 61.3 kg % Ideal Wt: 117%  Usual Wt:  Wt Readings from Last 10 Encounters:  04/18/12 158 lb 6.4 oz (71.85 kg)  02/26/12 161 lb (73.029 kg)  02/15/11 163 lb 7 oz (74.135 kg)   % Usual Wt: 98%  Body mass index is 24.81 kg/(m^2). WNL  Food/Nutrition Related Hx: No nutrition issues identified at admission  Labs:  CMP     Component Value Date/Time   NA 139 04/18/2012 0754   K 3.7 04/18/2012 0754   CL 102 04/18/2012 0754   CO2 26 04/18/2012 0754   GLUCOSE 153* 04/18/2012 0754   BUN 11 04/18/2012 0754   CREATININE 0.47* 04/18/2012 0754   CALCIUM 9.6 04/18/2012 0754   PROT 8.3 04/05/2012 1319   ALBUMIN 4.0 04/05/2012 1319   AST 51* 04/05/2012 1319   ALT 25 04/05/2012 1319   ALKPHOS 67 04/05/2012 1319   BILITOT 0.5 04/05/2012 1319   GFRNONAA >90 04/18/2012 0754   GFRAA >90 04/18/2012 0754  CBG (last 3)  No results found for this basename:  GLUCAP:3 in the last 72 hours   Intake/Output Summary (Last 24 hours) at 04/18/12 0946 Last data filed at 04/18/12 0824  Gross per 24 hour  Intake    120 ml  Output    300 ml  Net   -180 ml    Last BM: 04/16/12  Diet Order: CHO Modified Medium  Supplements/Tube Feeding: none  IVF:    sodium chloride Last Rate: 100 mL/hr at 04/06/12 1057   Pt transferred to [facility] for stroke. Admitted on 04/05/12. Pt continues to have delirium which is improving per MD. Pt with RN and family in the room to try to calm pt down. Pt is awaiting SNF placement. PO intake has been 75-100% this hospitalization, but poor today due to agitation. Unable to speak with pt at this time.  Per RN pt has been declining over the last two days per her assessment. Will attempt to give ensure with meds.   Estimated Nutritional Needs:   Kcal:  1750-1950 Protein:  80-90 grams Fluid:  >1.7 L/day  NUTRITION DIAGNOSIS: Inadequate oral intake r/t delirum AEB poor meal completion.  MONITORING/EVALUATION(Goals): Goal: Pt to meet >/= 90% of their estimated nutrition needs. Monitor: PO intake, weight  EDUCATION NEEDS: -No education needs identified  at this time  Kendell Bane RD, LDN, CNSC [phone] Pager [phone] After Hours Pager  04/18/2012, 9:45 AM

## 2012-04-18 NOTE — Clinical Social Work Note (Signed)
Clinical Social Work  Pt has an open claim on her insurance and her auto insurance in primary and her medicare is secondary.   CSW spoke with [facility] regarding discharge plan. They are will review clinicals. CSW also spoke with [facility], who is also able to extend a bed offer if Medicare became primary. CSW spoke with pt's niece. She understands that pt needs SNF, however [facility] is an option while Medicare is sorted out.   CSW will continue to follow.   Dede Query, MSW, Theresia Majors [phone]

## 2012-04-18 NOTE — Progress Notes (Signed)
Physical Therapy Treatment Patient Details Name: SHAUNAE SIELOFF MRN: [id number] DOB: 1961-01-03 Today's Date: 04/18/2012 Time: 1004-1030 PT Time Calculation (min): 26 min  PT Assessment / Plan / Recommendation Comments on Treatment Session  Pt with fluctuating participation based on her willingness to participate. Pt with improvements in bed mobiliy and stand, although still limited secondary to cognition and williness. Pt with extremely more attention as she perseverates on multiple topics throughout. Decreasing patients frequency as her participation is limited    Follow Up Recommendations  SNF     Does the patient have the potential to tolerate intense rehabilitation  No, Recommend SNF  Barriers to Discharge        Equipment Recommendations  3 in 1 bedside comode;Rolling walker with 5" wheels    Recommendations for Other Services    Frequency Min 2X/week   Plan Frequency needs to be updated;Discharge plan remains appropriate    Precautions / Restrictions Precautions Precautions: Fall Restrictions Weight Bearing Restrictions: No   Pertinent Vitals/Pain Pt complains of pain with bed mobility, no number given. RN aware.     Mobility  Bed Mobility Bed Mobility: Supine to Sit;Sit to Supine;Scooting to HOB Supine to Sit: 4: Min assist;HOB flat Sit to Supine: 4: Min assist Scooting to [facility]: 1: +2 Total assist Scooting to [facility]: Patient Percentage: 0% Details for Bed Mobility Assistance: Pt slow to complete with min assist for LEs. Pt with legs crossed upon sitting, complaining of it "hurt" Transfers Transfers: Sit to Stand;Stand to Sit Sit to Stand: 1: +2 Total assist;From bed Sit to Stand: Patient Percentage: 30% Stand to Sit: 1: +2 Total assist;To chair/3-in-1 Stand to Sit: Patient Percentage: 30% Transfer via Lift Equipment: Hydrographic surveyor Details for Transfer Assistance: Total A =2 (pt=30% sit to stand ) from EOB without being able to achieve upright posture and (0% stand to  sit due to no control) X 2.   Total A +2  with Huntley Dec Plus x 3 reps with pt needing extra A at hips for anterior pelvic tilt and cues to hold her head up and stand up tall. Pt would sit without warning.    Exercises     PT Diagnosis:    PT Problem List:   PT Treatment Interventions:     PT Goals Acute Rehab PT Goals PT Goal: Supine/Side to Sit - Progress: Progressing toward goal PT Goal: Sit to Supine/Side - Progress: Progressing toward goal PT Goal: Sit to Stand - Progress: Progressing toward goal PT Goal: Stand to Sit - Progress: Progressing toward goal  Visit Information  Last PT Received On: 04/18/12 Assistance Needed: +2    Subjective Data      Cognition  Overall Cognitive Status: Impaired Area of Impairment: Attention;Following commands;Safety/judgement Arousal/Alertness: Lethargic Behavior During Session: Lethargic Current Attention Level: Sustained Attention - Other Comments: Eyes intermittently opened Awareness of Errors: Assistance required to correct errors made Awareness of Errors - Other Comments: Pt tired to lunge forward off of bed, PT had to push her back onto the bed    Balance  Static Sitting Balance Static Sitting - Balance Support: Left upper extremity supported;Bilateral upper extremity supported Static Sitting - Level of Assistance: 4: Min assist Static Sitting - Comment/# of Minutes: Pt wants to sit with posterior pelvict tilt and head/neck flexed. Dynamic Sitting Balance Dynamic Sitting - Balance Support: Left upper extremity supported;Feet supported Dynamic Sitting - Level of Assistance: 3: Mod assist  End of Session PT - End of Session Equipment Utilized During Treatment: Gait  belt Activity Tolerance: Other (comment) (limited secondary to cognition) Patient left: in bed;with call bell/phone within reach;with bed alarm set (4 bedrails up) Nurse Communication: Mobility status   GP     Milana Kidney 04/18/2012, 1:24 PM

## 2012-04-18 NOTE — Progress Notes (Signed)
Occupational Therapy Treatment Patient Details Name: Tami Nolan MRN: [id number] DOB: [birth year] Today's Date: 04/18/2012 Time: 1004-1040 OT Time Calculation (min): 36 min  OT Assessment / Plan / Recommendation Comments on Treatment Session Pt stating she wanted to get up and out of here today; however unable to take any steps due to her inability to stand up straight, keep her eyes open , and stay focused on task. Goals will be updated due to change in focus and time frame close to being over.    Follow Up Recommendations  SNF       Equipment Recommendations  3 in 1 bedside comode;Rolling walker with 5" wheels       Frequency Min 2X/week   Plan Discharge plan remains appropriate    Precautions / Restrictions Precautions Precautions: Fall Restrictions Weight Bearing Restrictions: No       ADL  Grooming: Performed;Wash/dry face;Moderate assistance (needs hand brought to her face to initiate, tried to use RUE) Where Assessed - Grooming: Supported sitting (EOB in [employer]) Transfers/Ambulation Related to ADLs: See Mobility for sit to stand and stand to sit. Most of session focused on this activity with and without Sara Plus. ADL Comments: Donned glasses with Min A--mainly using LUE, but did try to use RUE      OT Goals Acute Rehab OT Goals OT Goal Formulation: Patient unable to participate in goal setting Time For Goal Achievement: 05/02/12 Potential to Achieve Goals: Fair ADL Goals Pt Will Perform Grooming: with min assist;Unsupported;Sitting, edge of bed (1 task (washing face)) ADL Goal: Grooming - Progress: Goal set today Pt Will Perform Upper Body Bathing: with mod assist;Unsupported;Sitting, edge of bed ADL Goal: Upper Body Bathing - Progress: Goal set today ADL Goal: Lower Body Bathing - Progress: Discontinued (comment) (Due to change in function) Pt Will Transfer to Toilet: with max assist;Squat pivot transfer;Stand pivot transfer;3-in-1 ADL Goal: Toilet  Transfer - Progress: Goal set today ADL Goal: Toileting - Clothing Manipulation - Progress: Discontinued (comment) (Due to change in function) ADL Goal: Toileting - Hygiene - Progress: Discontinued (comment) (due to change in function) Miscellaneous OT Goals Miscellaneous OT Goal #1: Pt will demonstrate sustained attention 50% of sesson with min verbal cuing OT Goal: Miscellaneous Goal #1 - Progress: Goal set today Miscellaneous OT Goal #2: Pt will perform static sitting balance with min guard A EOB for >3 minutes in prep for BADLS. OT Goal: Miscellaneous Goal #2 - Progress: Goal set today Miscellaneous OT Goal #3: Pt will follow 1-step commands 25% of session on first command OT Goal: Miscellaneous Goal #3 - Progress: Goal set today  Visit Information  Last OT Received On: 04/18/12 Assistance Needed: +2    Subjective Data  Subjective: "I need to go to jail"      Cognition  Overall Cognitive Status: Impaired Area of Impairment: Attention;Following commands;Safety/judgement Arousal/Alertness: Lethargic Behavior During Session: Lethargic Current Attention Level: Sustained Attention - Other Comments: Eyes intermittently opened Awareness of Errors: Assistance required to correct errors made Awareness of Errors - Other Comments: Pt tired to lunge forward off of bed, PT had to push her back onto the bed    Mobility   Bed Mobility Bed Mobility: Supine to Sit;Sit to Supine;Scooting to HOB Supine to Sit: 4: Min assist;HOB flat (pt stating she wanted to get up and out-motivated) Sit to Supine: 4: Min assist (Pt tired, with increased VCs she mostly laid  herself down) Scooting to [facility]: 1: +2 Total assist Scooting to [facility]: Patient Percentage: 0% (with  pad) Transfers Transfers: Sit to Stand;Stand to Sit Details for Transfer Assistance: Total A =2 (pt=30% sit to stand ) from EOB without being able to achieve upright posture and (0% stand to sit due to no control) X 2.   Total A +2  with Huntley Dec  Plus x 3 reps with pt needing extra A at hips for anterior pelvic tilt and cues to hold her head up and stand up tall. Pt would sit without warning.          Balance Static Sitting Balance Static Sitting - Balance Support: Left upper extremity supported;Bilateral upper extremity supported Static Sitting - Level of Assistance: 4: Min assist Static Sitting - Comment/# of Minutes: Pt wants to sit with posterior pelvict tilt and head/neck flexed. Dynamic Sitting Balance Dynamic Sitting - Balance Support: Left upper extremity supported;Feet supported Dynamic Sitting - Level of Assistance: 3: Mod assist   End of Session OT - End of Session Equipment Utilized During Treatment: Gait belt Huntley Dec Plus) Activity Tolerance: Patient limited by fatigue Patient left: in bed;with bed alarm set       Evette Georges [phone] 04/18/2012, 11:20 AM

## 2012-04-18 NOTE — Progress Notes (Signed)
[facility] PROGRESS NOTE  EH SAUSEDA JXB:[id number] DOB: January 13, 1961 DOA: 04/05/2012 PCP: [name], MD  Interval history 51 year old female who was transferred from any hospital after patient was found on the floor for unknown amount of time. Patient was having difficulty to get up due to weakness of the right side so patient was transferred to [facility] for further evaluation. Patient was seen by neurology and MRI confirmed left internal capsule infarct. Patient also was found to have rhabdo my lysis with CK slowly improving. Patient continued to be confused in the hospital, and delirium was thought to be secondary to the stroke and metabolic derangements. Psych was consulted and deemed patient having the capacity to make the decisions. At this time patient's medical issues are stable and she continues to be pleasantly confused though the confusion is improving. Patient sister and patient agreed to go to skilled nursing facility and they're awaiting bed.   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 - We are to continue aspirin 325 mg po daily - statin was held on admission given patients rhabdomyolysis. Should be continued once patient ready for discharge - Cardiac MRI negative for clot,  ASD or VSD. - pt received smoking cessation counseling - Patient with resultant right hemiplegia, right facial weakness, right neurologic neglect. - Disposition : still awaiting SNF   2.Delirium Secondary to above Patient is slowly improving, but not back to her baseline.  3.Hypokalemia - Magnesium levels within normal limits - Improved after oral repletion.  4.Rhabdomyolysis - given history probably related to being on the floor for a prolonged period of time - resolved, CPK now normalized -   5. Nicotine Abuse - Continue nicotine patch - smoking cessation recommended  6.  Hypertension Patient has elevated blood pressure we'll continue her on metoprolol 25 mg by mouth twice a day. She is also tachycardic which should be helped with metoprolol -better BP control on metoprolol, continue Psych saw the patient , and on 11/8 stated that she has limited decision making capacity, and that she understood and agreed to go to SNF   Code Status: full Family Communication: Spoke with Sister and brother at bedside  Disposition Plan: SNF   Consultants:  Neurology  Psychiatry  Procedures:  Carotid Doppler  Antibiotics:  None  HPI/Subjective: Wants help to get out of room, denies any complaints.  Objective: Filed Vitals:   04/18/12 0758 04/18/12 1041 04/18/12 1400 04/18/12 1852  BP: 128/100 132/72 133/75 138/65  Pulse: 98 88 82 88  Temp: 99 F (37.2 Nolan) 97.5 F (36.4 Nolan) 98.5 F (36.9 Nolan) 98.5 F (36.9 Nolan)  TempSrc: Oral Oral Oral Oral  Resp:  18 18 18   Height:      Weight: 71.85 kg (158 lb 6.4 oz)     SpO2:  98% 98% 97%    Intake/Output Summary (Last 24 hours) at 04/18/12 2154 Last data filed at 04/18/12 1308  Gross per 24 hour  Intake      0 ml  Output    300 ml  Net   -300 ml   Filed Weights   04/05/12 1206 04/18/12 0758  Weight: 79.379 kg (175 lb) 71.85 kg (158 lb 6.4 oz)    Exam:   General:  Pt in NAD, Alert and Awake  Cardiovascular: RRR, No mrg  Respiratory: CTA BL, no wheezes  Abdomen: soft, NT, Nondistended  Neuro: Pt has right upper and lower extremity weakness when  compared to the left, answers questions appropriately  Data Reviewed: Basic Metabolic Panel:  Lab 04/18/12 4782 04/15/12 1302  NA 139 139  K 3.7 3.8  CL 102 102  CO2 26 24  GLUCOSE 153* 154*  BUN 11 18  CREATININE 0.47* 0.55  CALCIUM 9.6 9.4  MG -- --  PHOS -- --   Liver Function Tests: No results found for this basename: AST:5,ALT:5,ALKPHOS:5,BILITOT:5,PROT:5,ALBUMIN:5 in the last 168 hours No results found for this basename: LIPASE:5,AMYLASE:5 in  the last 168 hours No results found for this basename: AMMONIA:5 in the last 168 hours CBC:  Lab 04/15/12 1302  WBC 10.7*  NEUTROABS --  HGB 15.0  HCT 40.8  MCV 78.3  PLT 382   Cardiac Enzymes:  Lab 04/17/12 0635 04/16/12 0644 04/15/12 0650 04/14/12 0545 04/13/12 0614  CKTOTAL 139 119 131 115 103  CKMB -- -- -- -- --  CKMBINDEX -- -- -- -- --  TROPONINI -- -- -- -- --   BNP (last 3 results) No results found for this basename: PROBNP:3 in the last 8760 hours CBG: No results found for this basename: GLUCAP:5 in the last 168 hours  No results found for this or any previous visit (from the past 240 hour(s)).   Studies: No results found.  Scheduled Meds:    . aspirin  325 mg Oral Daily  . enoxaparin  40 mg Subcutaneous Q24H  . feeding supplement  237 mL Oral BID BM  . lisinopril  5 mg Oral Daily  . metoprolol tartrate  25 mg Oral BID  . nicotine  21 mg Transdermal Daily   Continuous Infusions:    . sodium chloride 100 mL/hr at 04/06/12 1057    Principal Problem:  *Rhabdomyolysis Active Problems:  Stroke  Hypokalemia  Nicotine abuse    Time spent:25 minutes    Tami Nolan  [facility] Pager [phone]. If 8PM-8AM, please contact night-coverage at [email or web address], password [facility] 04/18/2012, 9:54 PM  LOS: 13 days

## 2012-04-19 NOTE — Progress Notes (Signed)
[facility] PROGRESS NOTE  JOHNATHAN CALLERY ZOX:[id number] DOB: December 03, 1960 DOA: 04/05/2012 PCP: [name], MD  Interval history 51 year old female who was transferred from any hospital after patient was found on the floor for unknown amount of time. Patient was having difficulty to get up due to weakness of the right side so patient was transferred to [facility] for further evaluation. Patient was seen by neurology and MRI confirmed left internal capsule infarct. Patient also was found to have rhabdo my lysis with CK slowly improving. Patient continued to be confused in the hospital, and delirium was thought to be secondary to the stroke and metabolic derangements. Psych was consulted and deemed patient having the capacity to make the decisions. At this time patient's medical issues are stable and she continues to be pleasantly confused though the confusion is improving. Patient sister and patient agreed to go to skilled nursing facility and they're awaiting bed.   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 - We are to continue aspirin 325 mg po daily - statin was held on admission given patients rhabdomyolysis. Should be continued once patient ready for discharge - Cardiac MRI negative for clot,  ASD or VSD. - pt received smoking cessation counseling - Patient with resultant right hemiplegia, right facial weakness, right neurologic neglect. - Disposition : still awaiting SNF   2.Delirium Secondary to above Patient is slowly improving, but not back to her baseline.  3.Hypokalemia - Magnesium levels within normal limits - Improved after oral repletion.  4.Rhabdomyolysis - given history probably related to being on the floor for a prolonged period of time - resolved, CPK now normalized -   5. Nicotine Abuse - Continue nicotine patch - smoking cessation recommended  6.  Hypertension Patient has elevated blood pressure we'll continue her on metoprolol 25 mg by mouth twice a day. She is also tachycardic which should be helped with metoprolol -better BP control on metoprolol, continue Psych saw the patient , and on 11/8 stated that she has limited decision making capacity, and that she understood and agreed to go to SNF   Code Status: full Family Communication: family at bedside  Disposition Plan: SNF   Consultants:  Neurology  Psychiatry  Procedures:  Carotid Doppler  Antibiotics:  None  HPI/Subjective:  denies any complaints.  Objective: Filed Vitals:   04/18/12 2237 04/19/12 0326 04/19/12 0551 04/19/12 0951  BP: 128/77 130/70 134/68 118/78  Pulse: 78 74 80 124  Temp: 98.6 F (37 C) 98.5 F (36.9 C) 98.7 F (37.1 C) 98.3 F (36.8 C)  TempSrc: Oral Oral Oral Axillary  Resp: 16 16 18 20   Height:      Weight:      SpO2: 98% 97% 95% 98%    Intake/Output Summary (Last 24 hours) at 04/19/12 1327 Last data filed at 04/19/12 8119  Gross per 24 hour  Intake    240 ml  Output      0 ml  Net    240 ml   Filed Weights   04/05/12 1206 04/18/12 0758  Weight: 79.379 kg (175 lb) 71.85 kg (158 lb 6.4 oz)    Exam:   General:  Pt in NAD, Alert and Awake  Cardiovascular: RRR, No mrg  Respiratory: CTA BL, no wheezes  Abdomen: soft, NT, Nondistended  Neuro: Pt has right upper and lower extremity weakness when compared to the left, answers questions appropriately  Data Reviewed: Basic Metabolic Panel:  Lab 04/18/12 0754 04/15/12 1302  NA 139 139  K 3.7 3.8  CL 102 102  CO2 26 24  GLUCOSE 153* 154*  BUN 11 18  CREATININE 0.47* 0.55  CALCIUM 9.6 9.4  MG -- --  PHOS -- --   Liver Function Tests: No results found for this basename: AST:5,ALT:5,ALKPHOS:5,BILITOT:5,PROT:5,ALBUMIN:5 in the last 168 hours No results found for this basename: LIPASE:5,AMYLASE:5 in the last 168 hours No results found for this basename: AMMONIA:5  in the last 168 hours CBC:  Lab 04/15/12 1302  WBC 10.7*  NEUTROABS --  HGB 15.0  HCT 40.8  MCV 78.3  PLT 382   Cardiac Enzymes:  Lab 04/17/12 0635 04/16/12 0644 04/15/12 0650 04/14/12 0545 04/13/12 0614  CKTOTAL 139 119 131 115 103  CKMB -- -- -- -- --  CKMBINDEX -- -- -- -- --  TROPONINI -- -- -- -- --   BNP (last 3 results) No results found for this basename: PROBNP:3 in the last 8760 hours CBG: No results found for this basename: GLUCAP:5 in the last 168 hours  No results found for this or any previous visit (from the past 240 hour(s)).   Studies: No results found.  Scheduled Meds:    . aspirin  325 mg Oral Daily  . enoxaparin  40 mg Subcutaneous Q24H  . feeding supplement  237 mL Oral BID BM  . lisinopril  5 mg Oral Daily  . metoprolol tartrate  25 mg Oral BID  . nicotine  21 mg Transdermal Daily   Continuous Infusions:    . sodium chloride 100 mL/hr at 04/06/12 1057    Principal Problem:  *Rhabdomyolysis Active Problems:  Stroke  Hypokalemia  Nicotine abuse    Time spent:25 minutes    Kellon Chalk C  [facility] Pager [phone]. If 8PM-8AM, please contact night-coverage at [email or web address], password [facility] 04/19/2012, 1:27 PM  LOS: 14 days

## 2012-04-20 NOTE — Progress Notes (Signed)
Occupational Therapy Treatment Patient Details Name: Tami Nolan MRN: [id number] DOB: 01-Dec-1960 Today's Date: 04/20/2012 Time: 4098-1191 OT Time Calculation (min): 9 min  OT Assessment / Plan / Recommendation Comments on Treatment Session Pt with increased participation during transfer.    Follow Up Recommendations  SNF    Barriers to Discharge       Equipment Recommendations  3 in 1 bedside comode;Rolling walker with 5" wheels    Recommendations for Other Services    Frequency Min 2X/week   Plan Discharge plan remains appropriate    Precautions / Restrictions     Pertinent Vitals/Pain See vitals    ADL  Toilet Transfer: Simulated;+2 Total assistance Toilet Transfer: Patient Percentage: 60% Toilet Transfer Method: Stand pivot Toilet Transfer Equipment: Other (comment) (bed to chair) Toileting - Clothing Manipulation and Hygiene: Performed;Maximal assistance Where Assessed - Toileting Clothing Manipulation and Hygiene: Standing Equipment Used: Gait belt Transfers/Ambulation Related to ADLs: stand pivot transfer from bed to chair with +2 assist for balance. ADL Comments: NT requesting assist with pt transfer.  Pt sitting EOB upon OT arrival.  Pt more interactive today and able to answer therapist questions appropriately.  During +2 assist for standing, pt attempting to assist therapist in pulling up her brief over hips with use of her left hand.    OT Diagnosis:    OT Problem List:   OT Treatment Interventions:     OT Goals ADL Goals Pt Will Transfer to Toilet: with max assist;Squat pivot transfer;Stand pivot transfer;3-in-1 ADL Goal: Toilet Transfer - Progress: Progressing toward goals Pt Will Perform Toileting - Clothing Manipulation: with supervision;Sitting on 3-in-1 or toilet ADL Goal: Toileting - Clothing Manipulation - Progress: Progressing toward goals Miscellaneous OT Goals Miscellaneous OT Goal #1: Pt will demonstrate sustained attention 50% of sesson with  min verbal cuing OT Goal: Miscellaneous Goal #1 - Progress: Progressing toward goals Miscellaneous OT Goal #2: Pt will perform static sitting balance with min guard A EOB for >3 minutes in prep for BADLS. OT Goal: Miscellaneous Goal #2 - Progress: Progressing toward goals  Visit Information  Last OT Received On: 04/20/12 Assistance Needed: +2    Subjective Data      Prior Functioning       Cognition  Overall Cognitive Status: Impaired Area of Impairment: Attention;Following commands;Safety/judgement Arousal/Alertness: Awake/alert Orientation Level: Disoriented to;Place;Time Behavior During Session: [facility] for tasks performed Current Attention Level: Sustained Cognition - Other Comments: Pt smiling during session and more conversant today. Althought mentioning throughout session "I am going to kick my sister's butt'    Mobility  Shoulder Instructions Bed Mobility Bed Mobility: Not assessed Transfers Transfers: Sit to Stand;Stand to Sit Sit to Stand: 1: +2 Total assist;From bed Sit to Stand: Patient Percentage: 60% Stand to Sit: 1: +2 Total assist;To chair/3-in-1 Stand to Sit: Patient Percentage: 60% Details for Transfer Assistance: assist for anterior pelvic tilt and assist with initating task transfer       Exercises      Balance Static Sitting Balance Static Sitting - Balance Support: Feet supported Static Sitting - Level of Assistance: 5: Stand by assistance Static Sitting - Comment/# of Minutes: 3 min   End of Session OT - End of Session Equipment Utilized During Treatment: Gait belt Activity Tolerance: Patient tolerated treatment well Patient left: in chair;with call bell/phone within reach Nurse Communication: Mobility status  GO   04/20/2012 Cipriano Mile OTR/L Pager [phone] Office [phone]   Cipriano Mile 04/20/2012, 12:51 PM

## 2012-04-20 NOTE — Progress Notes (Signed)
[facility] PROGRESS NOTE  Tami Nolan ZOX:[id number] DOB: 1961-03-17 DOA: 04/05/2012 PCP: [name], MD  Interval history 51 year old female who was transferred from any hospital after patient was found on the floor for unknown amount of time. Patient was having difficulty to get up due to weakness of the right side so patient was transferred to [facility] for further evaluation. Patient was seen by neurology and MRI confirmed left internal capsule infarct. Patient also was found to have rhabdo my lysis with CK slowly improving. Patient continued to be confused in the hospital, and delirium was thought to be secondary to the stroke and metabolic derangements. Psych was consulted and deemed patient having the capacity to make the decisions. At this time patient's medical issues are stable and she continues to be pleasantly confused though the confusion is improving. Patient's sister and patient agreed to go to skilled nursing facility and they're awaiting bed.   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 - We are to continue aspirin 325 mg po daily - statin was held on admission given patients rhabdomyolysis. Should be continued once patient ready for discharge - Cardiac MRI negative for clot,  ASD or VSD. - pt received smoking cessation counseling - Patient with resultant right hemiplegia, right facial weakness, right neurologic neglect. - Disposition : still awaiting SNF   2.Delirium Secondary to above Has improved some, but not back to her baseline.  3.Hypokalemia - Magnesium levels within normal limits - Improved after oral repletion.  4.Rhabdomyolysis - given history probably related to being on the floor for a prolonged period of time - resolved, CPK now normalized -   5. Nicotine Abuse - Continue nicotine patch - smoking cessation recommended  6. Hypertension Patient has  elevated blood pressure we'll continue her on metoprolol 25 mg by mouth twice a day. She is also tachycardic which should be helped with metoprolol -better BP control on metoprolol, continue Psych saw the patient , and on 11/8 stated that she has limited decision making capacity, and that she understood and agreed to go to SNF   Code Status: full Family Communication: family at bedside  Disposition Plan: SNF   Consultants:  Neurology  Psychiatry  Procedures:  Carotid Doppler  Antibiotics:  None  HPI/Subjective:  Trying to get out of bed, denies any complaints.  Objective: Filed Vitals:   04/19/12 1926 04/19/12 2300 04/20/12 1031 04/20/12 1346  BP: 121/84 135/91 108/50 124/89  Pulse: 124 122 129 98  Temp: 97.6 F (36.4 C) 97.9 F (36.6 C) 98 F (36.7 C) 98.7 F (37.1 C)  TempSrc: Oral Oral Oral Oral  Resp: 20 20 20 20   Height:      Weight:      SpO2: 100% 100% 92% 99%   No intake or output data in the 24 hours ending 04/20/12 1831 Filed Weights   04/05/12 1206 04/18/12 0758  Weight: 79.379 kg (175 lb) 71.85 kg (158 lb 6.4 oz)    Exam:   General:  Pt in NAD, Alert and Awake  Cardiovascular: RRR, No mrg  Respiratory: CTA BL, no wheezes  Abdomen: soft, NT, Nondistended  Neuro: Pt has right upper and lower extremity weakness when compared to the left, answers questions appropriately  Data Reviewed: Basic Metabolic Panel:  Lab 04/18/12 8119 04/15/12 1302  NA 139 139  K 3.7 3.8  CL 102 102  CO2 26 24  GLUCOSE 153* 154*  BUN 11  18  CREATININE 0.47* 0.55  CALCIUM 9.6 9.4  MG -- --  PHOS -- --   Liver Function Tests: No results found for this basename: AST:5,ALT:5,ALKPHOS:5,BILITOT:5,PROT:5,ALBUMIN:5 in the last 168 hours No results found for this basename: LIPASE:5,AMYLASE:5 in the last 168 hours No results found for this basename: AMMONIA:5 in the last 168 hours CBC:  Lab 04/15/12 1302  WBC 10.7*  NEUTROABS --  HGB 15.0  HCT 40.8  MCV  78.3  PLT 382   Cardiac Enzymes:  Lab 04/17/12 0635 04/16/12 0644 04/15/12 0650 04/14/12 0545  CKTOTAL 139 119 131 115  CKMB -- -- -- --  CKMBINDEX -- -- -- --  TROPONINI -- -- -- --   BNP (last 3 results) No results found for this basename: PROBNP:3 in the last 8760 hours CBG: No results found for this basename: GLUCAP:5 in the last 168 hours  No results found for this or any previous visit (from the past 240 hour(s)).   Studies: No results found.  Scheduled Meds:    . aspirin  325 mg Oral Daily  . enoxaparin  40 mg Subcutaneous Q24H  . feeding supplement  237 mL Oral BID BM  . lisinopril  5 mg Oral Daily  . metoprolol tartrate  25 mg Oral BID  . nicotine  21 mg Transdermal Daily   Continuous Infusions:    . sodium chloride 100 mL/hr at 04/06/12 1057    Principal Problem:  *Rhabdomyolysis Active Problems:  Stroke  Hypokalemia  Nicotine abuse    Time spent:25 minutes    [name]  [facility] Pager [phone]. If 8PM-8AM, please contact night-coverage at [email or web address], password [facility] 04/20/2012, 6:31 PM  LOS: 15 days

## 2012-04-21 LAB — BASIC METABOLIC PANEL
BUN: 16 mg/dL (ref 6–23)
CO2: 28 mEq/L (ref 19–32)
Calcium: 9.8 mg/dL (ref 8.4–10.5)
Creatinine, Ser: 0.53 mg/dL (ref 0.50–1.10)
Glucose, Bld: 157 mg/dL — ABNORMAL HIGH (ref 70–99)

## 2012-04-21 NOTE — Progress Notes (Signed)
[facility] PROGRESS NOTE  ITZAE MCCURDY MVH:[id number] DOB: 1961/02/01 DOA: 04/05/2012 PCP: [name], MD  Interval history 51 year old female who was transferred from any hospital after patient was found on the floor for unknown amount of time. Patient was having difficulty to get up due to weakness of the right side so patient was transferred to [facility] for further evaluation. Patient was seen by neurology and MRI confirmed left internal capsule infarct. Patient also was found to have rhabdo my lysis with CK slowly improving. Patient continued to be confused in the hospital, and delirium was thought to be secondary to the stroke and metabolic derangements. Psych was consulted and deemed patient having the capacity to make the decisions. At this time patient's medical issues are stable and she continues to be pleasantly confused though the confusion is improving. Patient's sister and patient agreed to go to skilled nursing facility and they're awaiting bed.   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 - We are to continue aspirin 325 mg po daily - statin was held on admission given patients rhabdomyolysis. Should be continued once patient ready for discharge - Cardiac MRI negative for clot,  ASD or VSD. - pt received smoking cessation counseling - Patient with resultant right hemiplegia, right facial weakness, right neurologic neglect. - Disposition : still awaiting SNF   2.Delirium Secondary to above Has improved some, but not back to her baseline.  3.Hypokalemia - Magnesium levels within normal limits - Improved after oral repletion.  4.Rhabdomyolysis - given history probably related to being on the floor for a prolonged period of time - resolved, CPK now normalized -   5. Nicotine Abuse - Continue nicotine patch - smoking cessation recommended  6. Hypertension Patient has  elevated blood pressure we'll continue her on metoprolol 25 mg by mouth twice a day. She is also tachycardic which should be helped with metoprolol -better BP control on metoprolol, continue Psych saw the patient , and on 11/8 stated that she has limited decision making capacity, and that she understood and agreed to go to SNF   Code Status: full Family Communication: family at bedside  Disposition Plan: SNF   Consultants:  Neurology  Psychiatry  Procedures:  Carotid Doppler  Antibiotics:  None  HPI/Subjective:  pt sitting up in recliner chair, eating lunch - denies any c/o Objective: Filed Vitals:   04/20/12 2158 04/21/12 0200 04/21/12 0922 04/21/12 1400  BP: 115/84 106/69 116/91 104/90  Pulse: 115 89 114 110  Temp:  98.4 F (36.9 C) 99 F (37.2 C) 98.5 F (36.9 C)  TempSrc:   Oral Oral  Resp: 18 18  18   Height:      Weight:      SpO2: 97% 92% 99% 100%   No intake or output data in the 24 hours ending 04/21/12 1726 Filed Weights   04/05/12 1206 04/18/12 0758  Weight: 79.379 kg (175 lb) 71.85 kg (158 lb 6.4 oz)    Exam:   General:  Pt in NAD, Alert   Cardiovascular: RRR, No mrg  Respiratory: CTA BL, no wheezes  Abdomen: soft, NT, Nondistended  Neuro: Pt has right upper and lower extremity weakness when compared to the left, answers simple questions appropriately  Data Reviewed: Basic Metabolic Panel:  Lab 04/21/12 8413 04/18/12 0754 04/15/12 1302  NA 139 139 139  K 3.8 3.7 3.8  CL 100 102 102  CO2 28 26 24   GLUCOSE  157* 153* 154*  BUN 16 11 18   CREATININE 0.53 0.47* 0.55  CALCIUM 9.8 9.6 9.4  MG -- -- --  PHOS -- -- --   Liver Function Tests: No results found for this basename: AST:5,ALT:5,ALKPHOS:5,BILITOT:5,PROT:5,ALBUMIN:5 in the last 168 hours No results found for this basename: LIPASE:5,AMYLASE:5 in the last 168 hours No results found for this basename: AMMONIA:5 in the last 168 hours CBC:  Lab 04/15/12 1302  WBC 10.7*  NEUTROABS  --  HGB 15.0  HCT 40.8  MCV 78.3  PLT 382   Cardiac Enzymes:  Lab 04/17/12 0635 04/16/12 0644 04/15/12 0650  CKTOTAL 139 119 131  CKMB -- -- --  CKMBINDEX -- -- --  TROPONINI -- -- --   BNP (last 3 results) No results found for this basename: PROBNP:3 in the last 8760 hours CBG: No results found for this basename: GLUCAP:5 in the last 168 hours  No results found for this or any previous visit (from the past 240 hour(s)).   Studies: No results found.  Scheduled Meds:    . aspirin  325 mg Oral Daily  . enoxaparin  40 mg Subcutaneous Q24H  . feeding supplement  237 mL Oral BID BM  . lisinopril  5 mg Oral Daily  . metoprolol tartrate  25 mg Oral BID  . nicotine  21 mg Transdermal Daily   Continuous Infusions:    . sodium chloride 100 mL/hr at 04/06/12 1057    Principal Problem:  *Rhabdomyolysis Active Problems:  Stroke  Hypokalemia  Nicotine abuse    Time spent:25 minutes    Zayden Hahne C  [facility] Pager [phone]. If 8PM-8AM, please contact night-coverage at [email or web address], password [facility] 04/21/2012, 5:26 PM  LOS: 16 days

## 2012-04-21 NOTE — Progress Notes (Signed)
Pt    attempted several times to get out of bed  Put in  recliner and brought to nurses station for close observation most part of the shift. Returned to room during dinner time. family member assisting with meal.Pt was transferred back to bed after dinner. . At 1725 . Haldol 2 mg IM  given as pt became aggressively abusive verbally and physical when nursing staff were preventing her from falling out of bed . Ativan 2 mg po given as haldol was ineffective . Ativan 2 mg given po at 1804 Will continue to monitor and notify MD if needed.

## 2012-04-22 DIAGNOSIS — M6281 Muscle weakness (generalized): Secondary | ICD-10-CM | POA: Diagnosis not present

## 2012-04-22 DIAGNOSIS — F329 Major depressive disorder, single episode, unspecified: Secondary | ICD-10-CM | POA: Diagnosis not present

## 2012-04-22 DIAGNOSIS — M6282 Rhabdomyolysis: Secondary | ICD-10-CM | POA: Diagnosis not present

## 2012-04-22 DIAGNOSIS — I6350 Cerebral infarction due to unspecified occlusion or stenosis of unspecified cerebral artery: Secondary | ICD-10-CM | POA: Diagnosis not present

## 2012-04-22 DIAGNOSIS — F331 Major depressive disorder, recurrent, moderate: Secondary | ICD-10-CM | POA: Diagnosis not present

## 2012-04-22 DIAGNOSIS — E782 Mixed hyperlipidemia: Secondary | ICD-10-CM | POA: Diagnosis not present

## 2012-04-22 DIAGNOSIS — I6789 Other cerebrovascular disease: Secondary | ICD-10-CM | POA: Diagnosis not present

## 2012-04-22 DIAGNOSIS — G35 Multiple sclerosis: Secondary | ICD-10-CM | POA: Diagnosis not present

## 2012-04-22 DIAGNOSIS — M629 Disorder of muscle, unspecified: Secondary | ICD-10-CM | POA: Diagnosis not present

## 2012-04-22 DIAGNOSIS — G459 Transient cerebral ischemic attack, unspecified: Secondary | ICD-10-CM | POA: Diagnosis not present

## 2012-04-22 DIAGNOSIS — E876 Hypokalemia: Secondary | ICD-10-CM | POA: Diagnosis not present

## 2012-04-22 DIAGNOSIS — I69959 Hemiplegia and hemiparesis following unspecified cerebrovascular disease affecting unspecified side: Secondary | ICD-10-CM | POA: Diagnosis not present

## 2012-04-22 DIAGNOSIS — F17200 Nicotine dependence, unspecified, uncomplicated: Secondary | ICD-10-CM | POA: Diagnosis not present

## 2012-04-22 DIAGNOSIS — G35D Multiple sclerosis, unspecified: Secondary | ICD-10-CM | POA: Diagnosis not present

## 2012-04-22 DIAGNOSIS — F39 Unspecified mood [affective] disorder: Secondary | ICD-10-CM | POA: Diagnosis not present

## 2012-04-22 DIAGNOSIS — I10 Essential (primary) hypertension: Secondary | ICD-10-CM | POA: Diagnosis not present

## 2012-04-22 MED ORDER — LORAZEPAM 2 MG PO TABS: 2.0000 mg | ORAL_TABLET | ORAL | Status: DC | PRN

## 2012-04-22 MED ORDER — ENSURE COMPLETE PO LIQD: 237.0000 mL | Freq: Two times a day (BID) | ORAL | Status: DC

## 2012-04-22 MED ORDER — MELOXICAM 7.5 MG PO TABS: 7.5000 mg | ORAL_TABLET | Freq: Every day | ORAL | Status: DC | PRN

## 2012-04-22 MED ORDER — METOPROLOL TARTRATE 25 MG PO TABS: 25.0000 mg | ORAL_TABLET | Freq: Two times a day (BID) | ORAL | Status: DC

## 2012-04-22 MED ORDER — ASPIRIN 325 MG PO TABS: 325.0000 mg | ORAL_TABLET | Freq: Every day | ORAL | Status: DC

## 2012-04-22 MED ORDER — SENNOSIDES-DOCUSATE SODIUM 8.6-50 MG PO TABS: 1.0000 | ORAL_TABLET | Freq: Every evening | ORAL | Status: DC | PRN

## 2012-04-22 MED ORDER — TRAZODONE HCL 100 MG PO TABS: 100.0000 mg | ORAL_TABLET | Freq: Every day | ORAL | Status: DC

## 2012-04-22 MED ORDER — LISINOPRIL 5 MG PO TABS: 5.0000 mg | ORAL_TABLET | Freq: Every day | ORAL | Status: DC

## 2012-04-22 MED ORDER — HYDROCODONE-ACETAMINOPHEN 5-325 MG PO TABS: 1.0000 | ORAL_TABLET | ORAL | Status: DC | PRN

## 2012-04-22 NOTE — Progress Notes (Signed)
Clinical Social Worker submitted DC Summary to [facility]-they are ready to accept pt.  CSW reviewed case with RN.  CSW phoned transport.  CSW to sign off at dc.    Angelia Mould, MSW, [place] [phone]

## 2012-04-22 NOTE — Discharge Summary (Addendum)
Physician Discharge Summary  Tami Nolan:[id number] DOB: 1961-05-08 DOA: 04/05/2012  PCP: [name], MD  Admit date: 04/05/2012 Discharge date: 04/22/2012  Time spent: >30 minutes  Recommendations for Outpatient Follow-up:  1.      Follow-up Information    Follow up with SETHI,PRAMODKUMAR P, MD. In 2 months. (Stroke Clinic)    Contact information:   [address], [address] [facility] [place] [address] [phone]       Please follow up. (SNF MD in 1-2days)         Discharge Diagnoses:  Principal Problem:  *Rhabdomyolysis Active Problems:  Stroke  Hypokalemia  Nicotine abuse   Discharge Condition: Improved/stable   Diet recommendation: 2 g sodium  Filed Weights   04/05/12 1206 04/18/12 0758  Weight: 79.379 kg (175 lb) 71.85 kg (158 lb 6.4 oz)    History of present illness:  Tami Nolan is a 51 y.o. female who was transferred from the [facility] ED for further evaluation and treatment after being found in her home on the floor for an unknown amount of time. She states she was unable to get up due to weakness of her right side. She does not know how long she had been on the floor. Her neighbors reported to EMS that they had not seen her for 2 days. When EMS came to her home her home was in disarray, and she did not want to go to the hospital. At the ED IVC papers were taken out so that the patient would remain for evaluations and treatment. She was transferred to [facility] since Neurology and Psychiatry specialists are not available on the weekends at [facility].    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 They recommended for hypercoagulable panel to be done which was ordered and the lupus anticoagulant is negative, no factor V Leiden results available so far. She is to follow up outpatient with Dr. Pearlean Brownie.  - We are to continue aspirin  325 mg po daily  - statin was held on admission given patients rhabdomyolysis. Should be continued once patient ready for discharge  - Cardiac MRI negative for clot, ASD or VSD.  - pt received smoking cessation counseling  - Patient with resultant right hemiplegia, right facial weakness, right neurologic neglect.  - Social work has persisted with placement, and patient remains medical he stable for discharge for skilled nursing facility for rehabilitation. 2.Delirium  Secondary to above  Has improved some, but not back to her baseline.   3.Hypokalemia  - Magnesium levels within normal limits  - Improved after oral repletion.  4.Rhabdomyolysis  - given history probably related to being on the floor for a prolonged period of time  - resolved, CPK now normalized   5. Nicotine Abuse  - She Was placed on a nicotine patch in the hospital. - smoking cessation recommended  6. Hypertension  Patient has elevated blood pressure we'll continue her on metoprolol 25 mg by mouth twice a day. She is also tachycardic which  helped with metoprolol. She was also maintained on lisinopril in the hospital wishes to continue upon discharge. Nursing home M.D. to continue to monitor her blood pressures and further adjust her medications as appropriate for optimal blood pressure control.   Psych saw the patient , and on 11/8 stated that she has limited decision making capacity, and that she understood and agreed to go to  SNF   Procedures: Carotid Doppler ultrasound on 11/3 Consultations:  Neurology    psychiatry Discharge Exam: Filed Vitals:   04/21/12 2230 04/22/12 0300 04/22/12 0705 04/22/12 1028  BP: 99/66 95/75 109/74 104/69  Pulse: 93 82 102 106  Temp: 97.7 F (36.5 C) 98 F (36.7 C) 97.7 F (36.5 C) 97.5 F (36.4 C)  TempSrc: Axillary Axillary Axillary Oral  Resp: 18 16 16 16   Height:      Weight:      SpO2: 100% 99% 99% 100%   Exam:  General: Pt in NAD, Alert  Cardiovascular: RRR, No  mrg  Respiratory: CTA BL, no wheezes  Abdomen: soft, NT, Nondistended  Neuro: Pt has right upper and lower extremity weakness when compared to the left, answers simple questions appropriately     Discharge Instructions  Discharge Orders    Future Orders Please Complete By Expires   Diet - low sodium heart healthy      Increase activity slowly          Medication List     As of 04/22/2012  2:50 PM    STOP taking these medications         gabapentin 600 MG tablet   Commonly known as: NEURONTIN      ibuprofen 800 MG tablet   Commonly known as: ADVIL,MOTRIN      TAKE these medications         aspirin 325 MG tablet   Take 1 tablet (325 mg total) by mouth daily.      cyclobenzaprine 10 MG tablet   Commonly known as: FLEXERIL   Take 10 mg by mouth 2 (two) times daily.      feeding supplement Liqd   Take 237 mLs by mouth 2 (two) times daily between meals.      HYDROcodone-acetaminophen 5-325 MG per tablet   Commonly known as: NORCO/VICODIN   Take 1 tablet by mouth every 4 (four) hours as needed. Pain      lisinopril 5 MG tablet   Commonly known as: PRINIVIL,ZESTRIL   Take 1 tablet (5 mg total) by mouth daily.      loratadine 10 MG tablet   Commonly known as: CLARITIN   Take 10 mg by mouth daily.      LORazepam 2 MG tablet   Commonly known as: ATIVAN   Take 1 tablet (2 mg total) by mouth every 4 (four) hours as needed for anxiety.      meloxicam 7.5 MG tablet   Commonly known as: MOBIC   Take 1 tablet (7.5 mg total) by mouth daily as needed (pain).      metoprolol tartrate 25 MG tablet   Commonly known as: LOPRESSOR   Take 1 tablet (25 mg total) by mouth 2 (two) times daily.      montelukast 10 MG tablet   Commonly known as: SINGULAIR   Take 10 mg by mouth at bedtime.      multivitamin with minerals Tabs   Take 1 tablet by mouth daily.      senna-docusate 8.6-50 MG per tablet   Commonly known as: Senokot-S   Take 1 tablet by mouth at bedtime as  needed.      simvastatin 40 MG tablet   Commonly known as: ZOCOR   Take 40 mg by mouth at bedtime.      traZODone 100 MG tablet   Commonly known as: DESYREL   Take 1 tablet (100 mg total) by mouth at bedtime.  venlafaxine XR 150 MG 24 hr capsule   Commonly known as: EFFEXOR-XR   Take 150 mg by mouth daily.           Follow-up Information    Follow up with SETHI,PRAMODKUMAR P, MD. In 2 months. (Stroke Clinic)    Contact information:   [address], [address] [facility] [place] [address] [phone]       Please follow up. (SNF MD in 1-2days)           The results of significant diagnostics from this hospitalization (including imaging, microbiology, ancillary and laboratory) are listed below for reference.    Significant Diagnostic Studies: Dg Chest 1 View  04/05/2012  *RADIOLOGY REPORT*  Clinical Data: 51 year old female altered mental status and confusion.  CHEST - 1 VIEW  Comparison: 03/27/2012 and earlier.  Findings: AP semi upright portable view 1316 hours.  The patient is rotated to the right.  Stable or slightly improved lung volumes. Stable cardiomegaly and mediastinal contours.  Allowing for portable technique, the lungs are clear.  No pneumothorax or effusion.  Lower thoracic and lumbar spinal hardware partially visible.  IMPRESSION: No acute cardiopulmonary abnormality.   Original Report Authenticated By: Erskine Speed, M.D.    Dg Chest 1 View  03/27/2012  *RADIOLOGY REPORT*  Clinical Data: Right shoulder pain  CHEST - 1 VIEW  Comparison: 04/04/2011  Findings: Hypoventilation with bibasilar atelectasis.  Cardiac enlargement with mild vascular congestion.  No pleural effusion.  IMPRESSION: Hypoventilation with bibasilar atelectasis.  Pulmonary vascular congestion without edema.   Original Report Authenticated By: Camelia Phenes, M.D.    Dg Chest 2 View  04/05/2012  *RADIOLOGY REPORT*  Clinical Data: Stroke.  Right-sided weakness.  CHEST - 2  VIEW  Comparison: 04/05/2012 at 1315 hours.  Findings: Shallow inspiration.  Heart size and pulmonary vascularity are normal for inspiratory effort.  No focal airspace consolidation in the lungs.  No blunting of costophrenic angles. No pneumothorax.  Postoperative changes in the thoracolumbar spine. No significant change since previous study.  IMPRESSION: No evidence of active pulmonary disease.   Original Report Authenticated By: Burman Nieves, M.D.    Dg Shoulder Right  03/27/2012  *RADIOLOGY REPORT*  Clinical Data: Shoulder pain.  Fall  RIGHT SHOULDER - 2+ VIEW  Comparison: 02/26/2012  Findings: Negative for fracture.  Normal alignment.  No significant degenerative change.  IMPRESSION: Negative   Original Report Authenticated By: Camelia Phenes, M.D.    Dg Hip Complete Right  04/05/2012  *RADIOLOGY REPORT*  Clinical Data: Altered mental status.  Bruise on right hip.  RIGHT HIP - COMPLETE 2+ VIEW  Comparison: No priors.  Findings: AP view of the pelvis and AP and lateral views of the right hip demonstrate no acute fracture, subluxation, dislocation, joint or soft tissue abnormality.  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  IMPRESSION: 1.  No acute radiographic abnormality of the bony pelvis or the right hip. 2.  Mild bilateral hip joint osteoarthritis.   Original Report Authenticated By: Trudie Reed, M.D.    Dg Knee 1-2 Views Left  04/05/2012  *RADIOLOGY REPORT*  Clinical Data: Pain.  Possible trauma.  LEFT KNEE - 1-2 VIEW  Comparison: 12/17/2004  Findings: AP and cross-table lateral views.  No acute fracture or dislocation.  No joint effusion.    Probable calcifications within the patellar tendon.  Present on the prior.  IMPRESSION: No acute osseous abnormality.   Original Report Authenticated By: Jeronimo Greaves, M.D.  Dg Knee 1-2 Views Right  04/05/2012  *RADIOLOGY REPORT*  Clinical Data: Altered mental status.  Bruise on right hip.  RIGHT KNEE - 1-2 VIEW  Comparison: No  priors.  Findings: AP and lateral views of the right knee demonstrate no acute fracture, subluxation, dislocation, joint or soft tissue abnormality.  Joint space narrowing and subchondral sclerosis is noted in the knee joint, most pronounced in the lateral compartment, compatible with osteoarthritis.  IMPRESSION: 1.  No acute radiographic abnormality of the right knee.   Original Report Authenticated By: Trudie Reed, M.D.    Dg Ankle Complete Right  04/01/2012  *RADIOLOGY REPORT*  Clinical Data: Swelling.  RIGHT ANKLE - COMPLETE 3+ VIEW  Comparison: Plain films of the ankle 02/26/2012.  Findings: There is new marked soft tissue swelling about the ankle and lower leg.  No fracture or dislocation is identified.  No notable degenerative changes seen.  IMPRESSION: Soft tissue swelling about the lower leg and ankle.  No underlying bony or joint abnormality.   Original Report Authenticated By: Bernadene Bell. Maricela Curet, M.D.    Ct Head Wo Contrast  04/05/2012  *RADIOLOGY REPORT*  Clinical Data: 51 year old female with altered mental status, confusion, right side weakness.  CT HEAD WITHOUT CONTRAST  Technique:  Contiguous axial images were obtained from the base of the skull through the vertex without contrast.  Comparison: 03/27/2012 and earlier.  Findings: Visualized paranasal sinuses and mastoids are clear. Visualized orbits and scalp soft tissues are within normal limits. No acute osseous abnormality identified.  Calcified atherosclerosis at the skull base.  Patchy confluent bilateral cerebral white matter hypodensity is stable from earlier this month.  This tracks to the deep white matter capsules as before.  Deep gray matter nuclei appear relatively spared.  The no ventriculomegaly. No midline shift, mass effect, or evidence of mass lesion.  No acute intracranial hemorrhage identified.  No evidence of cortically based acute infarction identified.  No suspicious intracranial vascular hyperdensity.  IMPRESSION:  Stable cerebral white matter changes, favor small vessel disease related.   Original Report Authenticated By: Erskine Speed, M.D.    Ct Head Wo Contrast  03/27/2012  *RADIOLOGY REPORT*  Clinical Data: Fall  CT HEAD WITHOUT CONTRAST  Technique:  Contiguous axial images were obtained from the base of the skull through the vertex without contrast.  Comparison: 03/01/2008  Findings: Advanced atrophy.  Chronic microvascular ischemia in the white matter.  No acute infarct.  No mass.  Negative for skull fracture.  Image quality degraded by motion.  IMPRESSION: Atrophy and chronic ischemic change.  No acute abnormality.   Original Report Authenticated By: Camelia Phenes, M.D.    Mr Brain Wo Contrast  04/05/2012  *RADIOLOGY REPORT*  Clinical Data: Altered mental status.  Right-sided weakness. History of multiple sclerosis.  MRI HEAD WITHOUT CONTRAST  Technique:  Multiplanar, multiecho pulse sequences of the brain and surrounding structures were obtained according to standard protocol without intravenous contrast.  Comparison: 04/05/2012 CT.  06/11/2007 MR.  Findings: Motion degraded exam.  Focal area of restricted motion posterior aspect of the posterior limb of the left internal capsule is most suggestive of a non hemorrhagic acute infarct rather than restricted motion related to an active MS plaque.  Marked white matter type changes confluent periventricular and subcortical region and in the central pons consistent with the patient's history of multiple sclerosis.  Small vessel disease type changes could contribute to some white matter type changes.  Global atrophy without hydrocephalus.  No intracranial mass  lesion detected on this unenhanced motion degraded exam.  Diminutive size left vertebral artery which may be congenitally small in addition to atherosclerotic type changes.  The small size of the vertebral arteries and basilar artery may be explained partially by the fetal type portion of the posterior cerebral  arteries.  Major intracranial vascular structures are patent.  IMPRESSION: Motion degraded exam.  Focal area of restricted motion posterior aspect of the posterior limb of the left internal capsule is more suggestive of a non hemorrhagic acute infarct rather than restricted motion related to an active MS plaque.  Marked white matter type changes confluent periventricular and subcortical region and in the central pons consistent with the patient's history of multiple sclerosis.  Small vessel disease type changes could contribute to some white matter type changes.  Global atrophy without hydrocephalus.  Critical Value/emergent results were called by telephone at the time of interpretation on 04/05/2012 at 3:15 p.m. to Dr. Lynelle Doctor.,, who verbally acknowledged these results.   Original Report Authenticated By: Lacy Duverney, M.D.    Mr Laqueta Jean Contrast  04/06/2012  *RADIOLOGY REPORT*  Clinical Data: Restricted diffusion left internal capsule on noncontrast exam 04/05/2012.  Rhabdomyolysis after being found down for unknown period of time.  Right-sided weakness.  MRI HEAD WITH CONTRAST  Technique:  Multiplanar, multiecho pulse sequences of the brain and surrounding structures were obtained according to standard protocol with intravenous contrast  Contrast: 15mL MULTIHANCE GADOBENATE DIMEGLUMINE 529 MG/ML IV SOLN  Comparison: 04/05/2012 MR  Findings: The patient had difficulty remaining motionless for the study.  Images are suboptimal.  Small or subtle lesions could be overlooked.  Following the administration of contrast, there is no abnormal intracranial enhancement. Special attention is directed to the posterior limb internal capsule as well as the periventricular white matter.  IMPRESSION: The area of concern involving posterior limb internal capsule on the left does not enhance.  Favor non-hemorrhagic acute infarct.   Original Report Authenticated By: Davonna Belling, M.D.    Mr Card Morphology W/o Cm  04/09/2012   Cardiac MRI:  Indication:  ? Apical thrombus  The patient was scanned on a 1.5 Tesla GE magnet.  A dedicated cardiac coil was used. Despite sedation the patient was not cooperative and had to be taken out of the scanner. No contrast was given. Only real time free breathing fast Echo sequences were able to be performed  All 4 cardiac chamber sized were normal.  There was no ASD and VSD There was no pericardial effusion.  Quantitative EF was 55% ( EDV 126, ESV 69 SV 57)  The apex appeared normal  There was no apical thrombus identified  Impression:     1)    Normal LV cavity size and function EF 55%        2)    No mural apical thrombus Note study suboptimal due to lack of patient cooperation.  No gadolinium given  Charlton Haws MD [facility]   Original Report Authenticated By: Charlton Haws, M.D.    Mr Mra Head/brain Wo Cm  04/06/2012  *RADIOLOGY REPORT*  Clinical Data: Right-sided weakness.  Suspected acute left hemisphere CVA.  MRA HEAD WITHOUT CONTRAST  Technique: Angiographic images of the Circle of Willis were obtained using MRA technique without intravenous contrast.  Comparison: Prior MRI and CT exams from 11/01 and 04/06/2012.  Findings:  Motion degraded examination.  Reduced image quality.  Mildly irregular but nonstenotic internal carotid artery cavernous segments bilaterally.  Hypoplastic basilar artery likely relates to fetal  origin both PCAs.  Right vertebral dominant/sole contributor basilar.  Hypoplastic or severely diseased proximal left anterior cerebral artery.  Mildly irregular but nonstenotic proximal MCA trunks. Both anterior cerebrals fill from the right.  Moderately irregular distal MCA and PCA branches consistent with intracranial atherosclerotic change.  No visible intracranial aneurysm.  IMPRESSION: No definite proximal flow reducing lesion of the carotid or basilar arteries.  Left A1 ACA absent.  No visible proximal left MCA stenosis to account for the acute left internal capsule infarct.    Original Report Authenticated By: Davonna Belling, M.D.     Microbiology: No results found for this or any previous visit (from the past 240 hour(s)).   Labs: Basic Metabolic Panel:  Lab 04/21/12 6213 04/18/12 0754  NA 139 139  K 3.8 3.7  CL 100 102  CO2 28 26  GLUCOSE 157* 153*  BUN 16 11  CREATININE 0.53 0.47*  CALCIUM 9.8 9.6  MG -- --  PHOS -- --   Liver Function Tests: No results found for this basename: AST:5,ALT:5,ALKPHOS:5,BILITOT:5,PROT:5,ALBUMIN:5 in the last 168 hours No results found for this basename: LIPASE:5,AMYLASE:5 in the last 168 hours No results found for this basename: AMMONIA:5 in the last 168 hours CBC: No results found for this basename: WBC:5,NEUTROABS:5,HGB:5,HCT:5,MCV:5,PLT:5 in the last 168 hours Cardiac Enzymes:  Lab 04/17/12 0635 04/16/12 0644  CKTOTAL 139 119  CKMB -- --  CKMBINDEX -- --  TROPONINI -- --   BNP: BNP (last 3 results) No results found for this basename: PROBNP:3 in the last 8760 hours CBG: No results found for this basename: GLUCAP:5 in the last 168 hours     Signed:  Kela Millin  [facility] 04/22/2012, 2:50 PM
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
## 2012-04-22 NOTE — Progress Notes (Signed)
Report has been called to the facility, patient ready to go, awaiting on PTAR for transportation.

## 2012-04-22 NOTE — Progress Notes (Cosign Needed)
PT/OT Cancellation Note  Patient Details Name: Tami Nolan MRN: [id number] DOB: 1961/04/25   Cancelled Treatment:     RN requesting to hold PT session at this time due to pt has been agitated/restless today & has finally calmed down & RN wanting to keep pt calm.      Verdell Face, [place] [phone] 04/22/2012

## 2012-05-08 DIAGNOSIS — M6282 Rhabdomyolysis: Secondary | ICD-10-CM | POA: Diagnosis not present

## 2012-05-08 DIAGNOSIS — I6789 Other cerebrovascular disease: Secondary | ICD-10-CM | POA: Diagnosis not present

## 2012-05-08 DIAGNOSIS — G35 Multiple sclerosis: Secondary | ICD-10-CM | POA: Diagnosis not present

## 2012-05-15 DIAGNOSIS — G35 Multiple sclerosis: Secondary | ICD-10-CM | POA: Diagnosis not present

## 2012-05-27 DIAGNOSIS — I6789 Other cerebrovascular disease: Secondary | ICD-10-CM | POA: Diagnosis not present

## 2012-06-12 DIAGNOSIS — M6282 Rhabdomyolysis: Secondary | ICD-10-CM | POA: Diagnosis not present

## 2012-06-12 DIAGNOSIS — I6789 Other cerebrovascular disease: Secondary | ICD-10-CM | POA: Diagnosis not present

## 2012-06-12 DIAGNOSIS — G35 Multiple sclerosis: Secondary | ICD-10-CM | POA: Diagnosis not present

## 2012-07-06 DIAGNOSIS — M6281 Muscle weakness (generalized): Secondary | ICD-10-CM | POA: Diagnosis not present

## 2012-07-06 DIAGNOSIS — G35 Multiple sclerosis: Secondary | ICD-10-CM | POA: Diagnosis not present

## 2012-07-06 DIAGNOSIS — F331 Major depressive disorder, recurrent, moderate: Secondary | ICD-10-CM | POA: Diagnosis not present

## 2012-07-06 DIAGNOSIS — I6789 Other cerebrovascular disease: Secondary | ICD-10-CM | POA: Diagnosis not present

## 2012-07-06 DIAGNOSIS — E782 Mixed hyperlipidemia: Secondary | ICD-10-CM | POA: Diagnosis not present

## 2012-07-06 DIAGNOSIS — I10 Essential (primary) hypertension: Secondary | ICD-10-CM | POA: Diagnosis not present

## 2012-07-06 DIAGNOSIS — F39 Unspecified mood [affective] disorder: Secondary | ICD-10-CM | POA: Diagnosis not present

## 2012-07-06 DIAGNOSIS — F329 Major depressive disorder, single episode, unspecified: Secondary | ICD-10-CM | POA: Diagnosis not present

## 2012-07-06 DIAGNOSIS — E876 Hypokalemia: Secondary | ICD-10-CM | POA: Diagnosis not present

## 2012-07-06 DIAGNOSIS — I69959 Hemiplegia and hemiparesis following unspecified cerebrovascular disease affecting unspecified side: Secondary | ICD-10-CM | POA: Diagnosis not present

## 2012-07-06 DIAGNOSIS — M6282 Rhabdomyolysis: Secondary | ICD-10-CM | POA: Diagnosis not present

## 2012-07-10 DIAGNOSIS — G35 Multiple sclerosis: Secondary | ICD-10-CM | POA: Diagnosis not present

## 2012-07-10 DIAGNOSIS — M6282 Rhabdomyolysis: Secondary | ICD-10-CM | POA: Diagnosis not present

## 2012-07-10 DIAGNOSIS — I6789 Other cerebrovascular disease: Secondary | ICD-10-CM | POA: Diagnosis not present

## 2012-07-28 DIAGNOSIS — F331 Major depressive disorder, recurrent, moderate: Secondary | ICD-10-CM | POA: Diagnosis not present

## 2012-07-31 DIAGNOSIS — I6789 Other cerebrovascular disease: Secondary | ICD-10-CM | POA: Diagnosis not present

## 2012-07-31 DIAGNOSIS — M6282 Rhabdomyolysis: Secondary | ICD-10-CM | POA: Diagnosis not present

## 2012-08-01 ENCOUNTER — Emergency Department (HOSPITAL_COMMUNITY)
Admission: EM | Admit: 2012-08-01 | Discharge: 2012-08-03 | Disposition: A | Discharge status: Home / Self Care | Payer: Medicare Other | Attending: Emergency Medicine | Admitting: Emergency Medicine

## 2012-08-01 ENCOUNTER — Encounter (HOSPITAL_COMMUNITY): Payer: Self-pay | Admitting: *Deleted

## 2012-08-01 DIAGNOSIS — Z8719 Personal history of other diseases of the digestive system: Secondary | ICD-10-CM | POA: Insufficient documentation

## 2012-08-01 DIAGNOSIS — Z993 Dependence on wheelchair: Secondary | ICD-10-CM | POA: Diagnosis not present

## 2012-08-01 DIAGNOSIS — Z7982 Long term (current) use of aspirin: Secondary | ICD-10-CM | POA: Insufficient documentation

## 2012-08-01 DIAGNOSIS — F17200 Nicotine dependence, unspecified, uncomplicated: Secondary | ICD-10-CM | POA: Insufficient documentation

## 2012-08-01 DIAGNOSIS — Z9181 History of falling: Secondary | ICD-10-CM | POA: Insufficient documentation

## 2012-08-01 DIAGNOSIS — R451 Restlessness and agitation: Secondary | ICD-10-CM

## 2012-08-01 DIAGNOSIS — Z79899 Other long term (current) drug therapy: Secondary | ICD-10-CM | POA: Insufficient documentation

## 2012-08-01 DIAGNOSIS — F411 Generalized anxiety disorder: Secondary | ICD-10-CM | POA: Diagnosis not present

## 2012-08-01 DIAGNOSIS — F419 Anxiety disorder, unspecified: Secondary | ICD-10-CM

## 2012-08-01 DIAGNOSIS — IMO0002 Reserved for concepts with insufficient information to code with codable children: Secondary | ICD-10-CM | POA: Insufficient documentation

## 2012-08-01 DIAGNOSIS — Z8619 Personal history of other infectious and parasitic diseases: Secondary | ICD-10-CM | POA: Diagnosis not present

## 2012-08-01 DIAGNOSIS — Z860100 Personal history of colon polyps, unspecified: Secondary | ICD-10-CM | POA: Insufficient documentation

## 2012-08-01 DIAGNOSIS — Z8669 Personal history of other diseases of the nervous system and sense organs: Secondary | ICD-10-CM | POA: Insufficient documentation

## 2012-08-01 LAB — BASIC METABOLIC PANEL
Calcium: 9.1 mg/dL (ref 8.4–10.5)
GFR calc Af Amer: >90 mL/min (ref >90)
GFR calc non Af Amer: >90 mL/min (ref >90)
Potassium: 3.8 mEq/L (ref 3.5–5.1)
Sodium: 139 mEq/L (ref 135–145)

## 2012-08-01 LAB — CBC WITH DIFFERENTIAL/PLATELET
Basophils Relative: 1 % (ref 0–1)
Eosinophils Absolute: 0.2 10*3/uL (ref 0.0–0.7)
Hemoglobin: 13.4 g/dL (ref 12.0–15.0)
Lymphocytes Relative: 49 % — ABNORMAL HIGH (ref 12–46)
MCHC: 36.2 g/dL — ABNORMAL HIGH (ref 30.0–36.0)
Monocytes Absolute: 0.6 10*3/uL (ref 0.1–1.0)
Neutrophils Relative %: 39 % — ABNORMAL LOW (ref 43–77)
Platelets: 275 10*3/uL (ref 150–400)
RBC: 4.7 MIL/uL (ref 3.87–5.11)

## 2012-08-01 LAB — RAPID URINE DRUG SCREEN, HOSP PERFORMED
Barbiturates: NOT DETECTED
Benzodiazepines: NOT DETECTED

## 2012-08-01 LAB — URINALYSIS, ROUTINE W REFLEX MICROSCOPIC
Glucose, UA: NEGATIVE mg/dL
Ketones, ur: NEGATIVE mg/dL
Nitrite: NEGATIVE
Specific Gravity, Urine: >1.03 — ABNORMAL HIGH (ref 1.005–1.030)
pH: 6 (ref 5.0–8.0)

## 2012-08-01 MED ORDER — ALUM & MAG HYDROXIDE-SIMETH 200-200-20 MG/5ML PO SUSP: 30.0000 mL | ORAL | Status: DC | PRN

## 2012-08-01 MED ORDER — ADULT MULTIVITAMIN W/MINERALS CH
1.0000 | ORAL_TABLET | Freq: Every day | ORAL | Status: DC
  Administered 2012-08-02: 1 via ORAL
  Filled 2012-08-01: qty 1

## 2012-08-01 MED ORDER — METOPROLOL TARTRATE 25 MG PO TABS
25.0000 mg | ORAL_TABLET | Freq: Two times a day (BID) | ORAL | Status: DC
  Administered 2012-08-02 (×2): 25 mg via ORAL
  Filled 2012-08-01 (×2): qty 1

## 2012-08-01 MED ORDER — ACETAMINOPHEN 325 MG PO TABS: 650.0000 mg | ORAL_TABLET | ORAL | Status: DC | PRN

## 2012-08-01 MED ORDER — LISINOPRIL 5 MG PO TABS
5.0000 mg | ORAL_TABLET | Freq: Every day | ORAL | Status: DC
  Administered 2012-08-02: 5 mg via ORAL
  Filled 2012-08-01: qty 1

## 2012-08-01 MED ORDER — NICOTINE 21 MG/24HR TD PT24: 21.0000 mg | MEDICATED_PATCH | Freq: Every day | TRANSDERMAL | Status: DC | PRN

## 2012-08-01 MED ORDER — LORAZEPAM 1 MG PO TABS: 1.0000 mg | ORAL_TABLET | Freq: Three times a day (TID) | ORAL | Status: DC | PRN

## 2012-08-01 MED ORDER — MONTELUKAST SODIUM 10 MG PO TABS
10.0000 mg | ORAL_TABLET | Freq: Every day | ORAL | Status: DC
  Administered 2012-08-02: 10 mg via ORAL
  Filled 2012-08-01 (×2): qty 1

## 2012-08-01 MED ORDER — SIMVASTATIN 20 MG PO TABS
40.0000 mg | ORAL_TABLET | Freq: Every day | ORAL | Status: DC
  Administered 2012-08-02: 40 mg via ORAL
  Filled 2012-08-01: qty 1

## 2012-08-01 MED ORDER — CYCLOBENZAPRINE HCL 10 MG PO TABS
10.0000 mg | ORAL_TABLET | Freq: Two times a day (BID) | ORAL | Status: DC
  Administered 2012-08-02 (×2): 10 mg via ORAL
  Filled 2012-08-01 (×2): qty 1

## 2012-08-01 MED ORDER — LORAZEPAM 1 MG PO TABS: 2.0000 mg | ORAL_TABLET | ORAL | Status: DC | PRN

## 2012-08-01 MED ORDER — ENSURE COMPLETE PO LIQD
237.0000 mL | Freq: Two times a day (BID) | ORAL | Status: DC
  Administered 2012-08-02: 10:00:00 via ORAL
  Administered 2012-08-02: 237 mL via ORAL
  Filled 2012-08-01 (×3): qty 237

## 2012-08-01 MED ORDER — TRAZODONE HCL 50 MG PO TABS
100.0000 mg | ORAL_TABLET | Freq: Every day | ORAL | Status: DC
  Administered 2012-08-02: 100 mg via ORAL
  Filled 2012-08-01: qty 1

## 2012-08-01 MED ORDER — LORATADINE 10 MG PO TABS
10.0000 mg | ORAL_TABLET | Freq: Every day | ORAL | Status: DC
  Administered 2012-08-02: 10 mg via ORAL
  Filled 2012-08-01: qty 1

## 2012-08-01 MED ORDER — VENLAFAXINE HCL ER 75 MG PO CP24
150.0000 mg | ORAL_CAPSULE | Freq: Every day | ORAL | Status: DC
  Administered 2012-08-02: 150 mg via ORAL
  Filled 2012-08-01 (×2): qty 1

## 2012-08-01 MED ORDER — ONDANSETRON HCL 4 MG PO TABS: 4.0000 mg | ORAL_TABLET | Freq: Three times a day (TID) | ORAL | Status: DC | PRN

## 2012-08-01 MED ORDER — MELOXICAM 7.5 MG PO TABS
7.5000 mg | ORAL_TABLET | Freq: Every day | ORAL | Status: DC | PRN
  Administered 2012-08-02: 7.5 mg via ORAL
  Filled 2012-08-01: qty 1

## 2012-08-01 MED ORDER — ASPIRIN 325 MG PO TABS
325.0000 mg | ORAL_TABLET | Freq: Every day | ORAL | Status: DC
  Administered 2012-08-02: 325 mg via ORAL
  Filled 2012-08-01: qty 1

## 2012-08-01 NOTE — ED Notes (Signed)
[employer] officers at bedside to sit with pt.
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
## 2012-08-01 NOTE — ED Notes (Signed)
Pt able to take own meds per EDP, will inventory and lock up meds, will give to [facility].
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
## 2012-08-01 NOTE — ED Notes (Signed)
Pt escorted to ER by [employer], PD called by pt's sister to take pt to ER, Pt is involuntary and has papers, pt's sister states she does not want her at her house anymore. Pt gets out of wheelchair repeatedly and keeps falling per pt's sister. Pt's sister said she can't take care of pt anymore. Pt has a HX of MS. Pt's sister states to PD that the pt continues to try to leave the house and she can't take care of her. Pt states she is fine and doesn't need any help and she is fine, will continue to get more information.

## 2012-08-01 NOTE — ED Notes (Signed)
Telepsych in process 

## 2012-08-02 MED ORDER — MELOXICAM 7.5 MG PO TABS: 7.5000 mg | ORAL_TABLET | Freq: Every day | ORAL | Status: DC | PRN

## 2012-08-02 MED ORDER — METOPROLOL TARTRATE 25 MG PO TABS: 25.0000 mg | ORAL_TABLET | Freq: Two times a day (BID) | ORAL | Status: DC

## 2012-08-02 MED ORDER — SENNOSIDES-DOCUSATE SODIUM 8.6-50 MG PO TABS: 1.0000 | ORAL_TABLET | Freq: Every evening | ORAL | Status: DC | PRN

## 2012-08-02 MED ORDER — TRAZODONE HCL 100 MG PO TABS: 100.0000 mg | ORAL_TABLET | Freq: Every day | ORAL | Status: DC

## 2012-08-02 MED ORDER — SIMVASTATIN 40 MG PO TABS: 40.0000 mg | ORAL_TABLET | Freq: Every day | ORAL | Status: DC

## 2012-08-02 MED ORDER — LORAZEPAM 2 MG PO TABS: 2.0000 mg | ORAL_TABLET | ORAL | Status: DC | PRN

## 2012-08-02 MED ORDER — CYCLOBENZAPRINE HCL 10 MG PO TABS: 10.0000 mg | ORAL_TABLET | Freq: Two times a day (BID) | ORAL | Status: DC

## 2012-08-02 MED ORDER — INTERFERON BETA-1B 0.3 MG ~~LOC~~ KIT: 0.3000 mg | PACK | SUBCUTANEOUS | Status: DC

## 2012-08-02 MED ORDER — VENLAFAXINE HCL ER 150 MG PO CP24: 150.0000 mg | ORAL_CAPSULE | Freq: Every day | ORAL | Status: AC

## 2012-08-02 MED ORDER — ADULT MULTIVITAMIN W/MINERALS CH: 1.0000 | ORAL_TABLET | Freq: Every day | ORAL | Status: AC

## 2012-08-02 MED ORDER — LORATADINE 10 MG PO TABS: 10.0000 mg | ORAL_TABLET | Freq: Every day | ORAL | Status: AC

## 2012-08-02 MED ORDER — ASPIRIN 325 MG PO TABS: 325.0000 mg | ORAL_TABLET | Freq: Every day | ORAL | Status: DC

## 2012-08-02 MED ORDER — HYDROCODONE-ACETAMINOPHEN 5-325 MG PO TABS: 1.0000 | ORAL_TABLET | ORAL | Status: DC | PRN

## 2012-08-02 MED ORDER — MONTELUKAST SODIUM 10 MG PO TABS: 10.0000 mg | ORAL_TABLET | Freq: Every day | ORAL | Status: AC

## 2012-08-02 MED ORDER — ENSURE COMPLETE PO LIQD: 237.0000 mL | Freq: Two times a day (BID) | ORAL | Status: DC

## 2012-08-02 MED ORDER — LISINOPRIL 5 MG PO TABS: 5.0000 mg | ORAL_TABLET | Freq: Every day | ORAL | Status: DC

## 2012-08-02 MED ORDER — MELOXICAM 7.5 MG PO TABS
ORAL_TABLET | ORAL | Status: AC
  Filled 2012-08-02: qty 1

## 2012-08-02 NOTE — ED Notes (Signed)
Ensure not available at this time. Pharmacy called for patient's ensure.

## 2012-08-02 NOTE — ED Notes (Signed)
Sister called and will be bringing patient's MS injection medication that patient is to get every other day. Per sister patient last got it yesterday.

## 2012-08-02 NOTE — ED Provider Notes (Addendum)
52 year old female with a history of multiple sclerosis, she has been seen and evaluated, has been seen by psychiatry bite [facility], has been seen by social work and by myself. At this time the patient is not a danger to herself or others, her IVC has been rescinded, she is amenable to placement in an assisted living facility that is run by her family members. At this time she appears stable for this disposition. She is agreeable to this disposition. Social work has helped to place and finish the paperwork.  [name], MD 08/02/12 1505  [name], MD 08/02/12 [phone]

## 2012-08-02 NOTE — ED Notes (Signed)
Patient will not be able to go to [facility] till tomorrow at 08:30 due to lack of staff. Pharmacy called and notified. Ensure requested from pharmacy.

## 2012-08-02 NOTE — ED Notes (Signed)
Patient has placement/accepted at [facility] per Case Manager [name].

## 2012-08-02 NOTE — BH Assessment (Addendum)
Assessment Note   Tami Nolan is an 52 y.o. female.  Wheel-chair dependent pt lying in bed with bed-side commode at bedside. Staff report that she is unable to use the bed-side commode even with assist, that they are going to start using the bed pan. Pt is a poor historian and is unwilling or unable to answer any questions in depth giving yes or no answers to all questions.  When asked why she is here she said, "Because my sister is a bitch". That was the longest answer that she gave to any question.  Denied SI/ HI, except she would like to hurt her sister, but would "not really".  According to report, she was living with her sister and she kept trying to leave the house, getting out of the wheel chair repeatedly and falling. Her sister states that she can no longer care for her. Pt denies that she is depressed, but is despondent and blunted appearing. Her status is involuntary and when asked if she would try to leave if she were voluntary she said, "maybe" although she is unable. Telepsych recommended placement in inpatient facility and clarification of IVC as pt is not suicidal, homicidal, and is not psychotic. Plan is to find placement in facility for pt for continued evaluation and care.  IVC rescinded because she cannot meet criteria for inpatient psychiatric treatment: She is not suicidal, homicidal, nor psychotic.  Dr. Leavy Cella plan is to obtain Social Work consult for placement.    Axis I: Depressive Disorder NOS Axis II: Deferred Axis III:  Past Medical History  Diagnosis Date  . Hepatitis     ETOH related   . ETOH abuse   . Hx of abnormal cervical Pap smear 1997  . Colon polyps 1997  . Genital warts   . Multiple sclerosis    Axis IV: housing problems, other psychosocial or environmental problems, problems related to social environment and problems with primary support group Axis V: 41-50 serious symptoms  Past Medical History:  Past Medical History  Diagnosis Date  .  Hepatitis     ETOH related   . ETOH abuse   . Hx of abnormal cervical Pap smear 1997  . Colon polyps 1997  . Genital warts   . Multiple sclerosis     Past Surgical History  Procedure Laterality Date  . Bilatetral tubal ligation      Family History:  Family History  Problem Relation Age of Onset  . Coronary artery disease Brother   . Diabetes Brother   . Hypertension Brother   . Hypertension Mother   . Diabetes Mother   . Hypertension Sister   . Alcohol abuse      family history     Social History:  reports that she has been smoking Cigarettes.  She has a 15 pack-year smoking history. She has never used smokeless tobacco. She reports that she does not drink alcohol or use illicit drugs.  Additional Social History:     CIWA: CIWA-Ar BP: 136/82 mmHg Pulse Rate: 102 COWS:    Allergies: No Known Allergies  Home Medications:  (Not in a hospital admission)  OB/GYN Status:  No LMP recorded. Patient is postmenopausal.  General Assessment Data Location of Assessment: [facility] ACT Assessment: Yes Living Arrangements: Other relatives Can pt return to current living arrangement?: No Admission Status: Involuntary Is patient capable of signing voluntary admission?: No Transfer from: Home Referral Source: Self/Family/Friend  Education Status Is patient currently in school?: No  Risk  to self Suicidal Ideation: No Suicidal Intent: No Is patient at risk for suicide?: No Suicidal Plan?: No Access to Means: No What has been your use of drugs/alcohol within the last 12 months?:  (denies) Previous Attempts/Gestures: No Intentional Self Injurious Behavior: None Family Suicide History: Unknown Recent stressful life event(s): Other (Comment) (pt would not comment) Persecutory voices/beliefs?: No Depression: Yes Depression Symptoms: Despondent;Feeling angry/irritable (denies depression, but appear depressed) Substance abuse history and/or treatment for substance abuse?:  No Suicide prevention information given to non-admitted patients: Not applicable  Risk to Others Homicidal Ideation: No Thoughts of Harm to Others: No Current Homicidal Intent: No Current Homicidal Plan: No Access to Homicidal Means: No Criminal Charges Pending?: No Does patient have a court date: No  Psychosis Hallucinations: None noted Delusions: None noted  Mental Status Report Appear/Hygiene: Disheveled;Body odor Eye Contact: Poor Motor Activity: Psychomotor retardation (wheel chair dependent) Speech: Logical/coherent (gave short 1-word answers; appeared to understand) Level of Consciousness: Alert Mood: Depressed;Angry Affect: Blunted;Irritable Anxiety Level: Moderate Thought Processes: Coherent;Relevant Judgement: Impaired Orientation: Person;Place;Time;Situation Obsessive Compulsive Thoughts/Behaviors: Minimal  Cognitive Functioning Concentration: Decreased Memory: Recent Intact;Remote Intact IQ: Average Insight: Poor Impulse Control: Poor Appetite: Fair Sleep: No Change Vegetative Symptoms: None  ADLScreening [facility] Assessment Services) Patient's cognitive ability adequate to safely complete daily activities?: No Patient able to express need for assistance with ADLs?: Yes Independently performs ADLs?: No  Abuse/Neglect [facility]) Physical Abuse: Denies Verbal Abuse: Denies Sexual Abuse: Denies  Prior Inpatient Therapy Prior Inpatient Therapy:  (pt denies, but likely has had)  Prior Outpatient Therapy Prior Outpatient Therapy:  (unknown; poor historian and uncooperative)  ADL Screening (condition at time of admission) Patient's cognitive ability adequate to safely complete daily activities?: No Patient able to express need for assistance with ADLs?: Yes Independently performs ADLs?: No       Abuse/Neglect Assessment (Assessment to be complete while patient is alone) Physical Abuse: Denies Verbal Abuse: Denies Sexual Abuse: Denies Values /  Beliefs Cultural Requests During Hospitalization: None Spiritual Requests During Hospitalization: None        Additional Information 1:1 In Past 12 Months?:  (unknown) CIRT Risk: No Elopement Risk: No Does patient have medical clearance?: Yes     Disposition:  Disposition Initial Assessment Completed: Yes Disposition of Patient: Inpatient treatment program Type of inpatient treatment program: Adult  On Site Evaluation by:   Reviewed with Physician:     Genia Del 2020/03/1213 11:14 AM

## 2012-08-02 NOTE — ED Notes (Signed)
Took patient to restroom  with two person assist using wheelchair. Patient states that she has misplaced her false tooth and that it was on her tray. Did not see it in the room. Also made her nurse aware of this.

## 2012-08-02 NOTE — Clinical Social Work Psychosocial (Signed)
Clinical Social Work Department BRIEF PSYCHOSOCIAL ASSESSMENT 10/20/2012  Patient:  Tami Nolan, Tami Nolan     Account Number:  [id number]     Admit date:  08/01/2012  Clinical Social Worker:  [name]  Date/Time:  005/18/2014 03:40 PM  Referred by:  Physician  Date Referred:  005/18/2014 Referred for  ALF Placement   Other Referral:   Interview type:  Patient Other interview type:   sister- [name]    PSYCHOSOCIAL DATA Living Status:  FAMILY Admitted from facility:   Level of care:   Primary support name:  [name] Primary support relationship to patient:  SIBLING Degree of support available:   limited per pt    CURRENT CONCERNS Current Concerns  Post-Acute Placement   Other Concerns:    SOCIAL WORK ASSESSMENT / PLAN CSW met with pt in ED following call from RN that pt required placement. Pt alert and oriented to person. She did not know month or year. She knew she was in a hospital, but said both [address] and [place] during conversation. Pt states Obama is our Economist. Pt was at [facility] from November until this week when her sister signed her out in order to transfer her to [facility], which is owned by pt's niece. There were issues with the pipes at facility and so pt stayed with her sister. Per [name], pt decided she wanted to stay permanently with her and not go to the facility and became very upest when she was told no. She tried to leave the house. Pt ended up in ED with IVC paperwork. She was evaluated by telepsych who initially recommended inpatient treatment. Pt assessed by ACT team and recommendation was for ALF. Pt calm and cooperative with CSW. MD reports pt is psychiatrically cleared for ALF. CSW discussed going to niece's facility and she is agreeable. She was initially upset because she felt since she was family she should not be charged to stay there. Pt now understands that they are meeting all her needs. Pt has applied for Medicaid. CSW spoke with  [name] at facility who is agreeable to accept pt. She requests hospital bed, wheelchair, and BSC. [name] notified that pt does not qualify for hospital bed, but other equipment has been ordered. Home health PT and SW also ordered. CSW completed FL2 at request of [name]. AVS and reconciled FL2 prepared for facility. CSW notified RN of above. CSW then received call at 1615 stating that facility could not accept pt until tomorrow due to lack of staffing since it is a new facility.   Assessment/plan status:  No Further Intervention Required Other assessment/ plan:   Information/referral to community resources:    PATIENT'S/FAMILY'S RESPONSE TO PLAN OF CARE: Pt agreeable to go to niece's facility only. CSW requested weekend CSW at [facility] to contact ED in morning to make sure there have been no changes.        [name], [place] [phone]

## 2012-08-02 NOTE — ED Notes (Signed)
Pt continues to rest, breathing even and unlabored, in NAD at this time.

## 2012-08-02 NOTE — ED Notes (Signed)
Patient's sister brought patient IM MS medication (Betaseron) in for patient. Dr Hyacinth Meeker aware. Patient to have injection tomorrow morning then every other day per Dr Hyacinth Meeker. Medication at nurses station with patient's labels. Pharmacy to be notified. 5 1/2 boxes of medication 14 injections in each box.

## 2012-08-02 NOTE — ED Notes (Signed)
Patient asking for milk to go with her Peanut butter crackers.  Will attempt to obtain

## 2012-08-02 NOTE — ED Notes (Signed)
Pharmacy called and notified of patient's home medication-receipt of patient's home medications stored by pharmacy form filled out.

## 2012-08-02 NOTE — ED Notes (Signed)
Per Griffin Dakin (Act Team) Patient does not meet criteria for admission to psych facility because she is not suicidal or homicidal and because of her physical/medical issues. Per Diane case manager to be called and notified for placement at a long term care facility which if patient refuses she is to be discharged into sisters custody if sister will take responsibility. Ms Melvyn Neth (patient's sister) stated earlier on phone that she could not take care of sister at home and did not wish her sister to be discharged into her care but that she would help with any other needs regarding the patient. [name], case manager, called and notified of situation. Diannia Ruder to come and talk to patient.

## 2012-08-03 DIAGNOSIS — Z9289 Personal history of other medical treatment: Secondary | ICD-10-CM

## 2012-08-03 HISTORY — DX: Personal history of other medical treatment: Z92.89

## 2012-08-03 LAB — URINE CULTURE

## 2012-08-03 NOTE — ED Notes (Signed)
Called Diannia Ruder, Child psychotherapist for [employer] - left message.

## 2012-08-03 NOTE — ED Notes (Signed)
Left in c/o [name], staff of [facility], for transport. Instructions, prescriptions and f/u care information given/reviewed with [name]; verbalizes understanding.

## 2012-08-03 NOTE — ED Notes (Signed)
No family or staff has arrived to transport patient to assisted living facility; [place] PD states they are willing to transport pt, but we are not aware of the location of [facility].  Social worker at [employer] [facility] contacted re: contact information for family or [facility]; voicemail left.

## 2012-08-05 DIAGNOSIS — I69959 Hemiplegia and hemiparesis following unspecified cerebrovascular disease affecting unspecified side: Secondary | ICD-10-CM | POA: Diagnosis not present

## 2012-08-05 DIAGNOSIS — F39 Unspecified mood [affective] disorder: Secondary | ICD-10-CM | POA: Diagnosis not present

## 2012-08-05 DIAGNOSIS — Z9181 History of falling: Secondary | ICD-10-CM | POA: Diagnosis not present

## 2012-08-05 DIAGNOSIS — I10 Essential (primary) hypertension: Secondary | ICD-10-CM | POA: Diagnosis not present

## 2012-08-05 DIAGNOSIS — G35 Multiple sclerosis: Secondary | ICD-10-CM | POA: Diagnosis not present

## 2012-08-05 DIAGNOSIS — F411 Generalized anxiety disorder: Secondary | ICD-10-CM | POA: Diagnosis not present

## 2012-08-05 DIAGNOSIS — Z5189 Encounter for other specified aftercare: Secondary | ICD-10-CM | POA: Diagnosis not present

## 2012-08-07 DIAGNOSIS — I10 Essential (primary) hypertension: Secondary | ICD-10-CM | POA: Diagnosis not present

## 2012-08-07 DIAGNOSIS — F411 Generalized anxiety disorder: Secondary | ICD-10-CM | POA: Diagnosis not present

## 2012-08-07 DIAGNOSIS — I69959 Hemiplegia and hemiparesis following unspecified cerebrovascular disease affecting unspecified side: Secondary | ICD-10-CM | POA: Diagnosis not present

## 2012-08-07 DIAGNOSIS — G35 Multiple sclerosis: Secondary | ICD-10-CM | POA: Diagnosis not present

## 2012-08-07 DIAGNOSIS — Z5189 Encounter for other specified aftercare: Secondary | ICD-10-CM | POA: Diagnosis not present

## 2012-08-07 DIAGNOSIS — F39 Unspecified mood [affective] disorder: Secondary | ICD-10-CM | POA: Diagnosis not present

## 2012-08-07 NOTE — Care Management Note (Signed)
    Page 1 of 2   08/02/2012     4:15:39 PM   CARE MANAGEMENT NOTE 08/02/2012  Patient:  Tami Nolan, Tami Nolan   Account Number:  [id number]  Date Initiated:  08/02/2012  Documentation initiated by:  Rosemary Holms  Subjective/Objective Assessment:   CSW asked for assistance with ED pt going to ASL facility. Pt set up with [facility] PT and SW. DME to be delivered [facility] and Wheelchair.     Action/Plan:   Anticipated DC Date:  08/02/2012   Anticipated DC Plan:  ASSISTED LIVING / REST HOME  In-house referral  Clinical Social Worker      DC Planning Services  CM consult      Choice offered to / List presented to:     DME arranged  BEDSIDE COMMODE  WHEELCHAIR - MANUAL      DME agency  [facility]     HH arranged  HH-2 PT  HH-6 SOCIAL WORKER      HH agency  [facility]   Status of service:  Completed, signed off Medicare Important Message given?   (If response is "NO", the following Medicare IM given date fields will be blank) Date Medicare IM given:   Date Additional Medicare IM given:    Discharge Disposition:  ASSISTED LIVING  Per UR Regulation:    If discussed at Long Length of Stay Meetings, dates discussed:    Comments:  08/02/12 Rosemary Holms RN BSN CM [facility] in [place] will be able to accept pt tomorrow. [facility] notified DC not today.

## 2012-08-12 DIAGNOSIS — G35 Multiple sclerosis: Secondary | ICD-10-CM | POA: Diagnosis not present

## 2012-08-12 DIAGNOSIS — F411 Generalized anxiety disorder: Secondary | ICD-10-CM | POA: Diagnosis not present

## 2012-08-12 DIAGNOSIS — I69959 Hemiplegia and hemiparesis following unspecified cerebrovascular disease affecting unspecified side: Secondary | ICD-10-CM | POA: Diagnosis not present

## 2012-08-12 DIAGNOSIS — F39 Unspecified mood [affective] disorder: Secondary | ICD-10-CM | POA: Diagnosis not present

## 2012-08-12 DIAGNOSIS — I10 Essential (primary) hypertension: Secondary | ICD-10-CM | POA: Diagnosis not present

## 2012-08-12 DIAGNOSIS — Z5189 Encounter for other specified aftercare: Secondary | ICD-10-CM | POA: Diagnosis not present

## 2012-08-14 DIAGNOSIS — I10 Essential (primary) hypertension: Secondary | ICD-10-CM | POA: Diagnosis not present

## 2012-08-14 DIAGNOSIS — F39 Unspecified mood [affective] disorder: Secondary | ICD-10-CM | POA: Diagnosis not present

## 2012-08-14 DIAGNOSIS — F411 Generalized anxiety disorder: Secondary | ICD-10-CM | POA: Diagnosis not present

## 2012-08-14 DIAGNOSIS — Z5189 Encounter for other specified aftercare: Secondary | ICD-10-CM | POA: Diagnosis not present

## 2012-08-14 DIAGNOSIS — G35 Multiple sclerosis: Secondary | ICD-10-CM | POA: Diagnosis not present

## 2012-08-14 DIAGNOSIS — I69959 Hemiplegia and hemiparesis following unspecified cerebrovascular disease affecting unspecified side: Secondary | ICD-10-CM | POA: Diagnosis not present

## 2012-08-16 DIAGNOSIS — G35 Multiple sclerosis: Secondary | ICD-10-CM | POA: Diagnosis not present

## 2012-08-16 DIAGNOSIS — F411 Generalized anxiety disorder: Secondary | ICD-10-CM | POA: Diagnosis not present

## 2012-08-16 DIAGNOSIS — F39 Unspecified mood [affective] disorder: Secondary | ICD-10-CM | POA: Diagnosis not present

## 2012-08-16 DIAGNOSIS — I69959 Hemiplegia and hemiparesis following unspecified cerebrovascular disease affecting unspecified side: Secondary | ICD-10-CM | POA: Diagnosis not present

## 2012-08-16 DIAGNOSIS — I10 Essential (primary) hypertension: Secondary | ICD-10-CM | POA: Diagnosis not present

## 2012-08-16 DIAGNOSIS — Z5189 Encounter for other specified aftercare: Secondary | ICD-10-CM | POA: Diagnosis not present

## 2012-08-19 DIAGNOSIS — I10 Essential (primary) hypertension: Secondary | ICD-10-CM | POA: Diagnosis not present

## 2012-08-19 DIAGNOSIS — F39 Unspecified mood [affective] disorder: Secondary | ICD-10-CM | POA: Diagnosis not present

## 2012-08-19 DIAGNOSIS — G35 Multiple sclerosis: Secondary | ICD-10-CM | POA: Diagnosis not present

## 2012-08-19 DIAGNOSIS — F411 Generalized anxiety disorder: Secondary | ICD-10-CM | POA: Diagnosis not present

## 2012-08-19 DIAGNOSIS — Z5189 Encounter for other specified aftercare: Secondary | ICD-10-CM | POA: Diagnosis not present

## 2012-08-19 DIAGNOSIS — I69959 Hemiplegia and hemiparesis following unspecified cerebrovascular disease affecting unspecified side: Secondary | ICD-10-CM | POA: Diagnosis not present

## 2012-08-21 DIAGNOSIS — F39 Unspecified mood [affective] disorder: Secondary | ICD-10-CM | POA: Diagnosis not present

## 2012-08-21 DIAGNOSIS — G35 Multiple sclerosis: Secondary | ICD-10-CM | POA: Diagnosis not present

## 2012-08-21 DIAGNOSIS — F411 Generalized anxiety disorder: Secondary | ICD-10-CM | POA: Diagnosis not present

## 2012-08-21 DIAGNOSIS — I69959 Hemiplegia and hemiparesis following unspecified cerebrovascular disease affecting unspecified side: Secondary | ICD-10-CM | POA: Diagnosis not present

## 2012-08-21 DIAGNOSIS — Z5189 Encounter for other specified aftercare: Secondary | ICD-10-CM | POA: Diagnosis not present

## 2012-08-21 DIAGNOSIS — I10 Essential (primary) hypertension: Secondary | ICD-10-CM | POA: Diagnosis not present

## 2012-08-23 ENCOUNTER — Other Ambulatory Visit: Payer: Self-pay

## 2012-08-23 ENCOUNTER — Emergency Department (HOSPITAL_COMMUNITY): Payer: Medicare Other

## 2012-08-23 ENCOUNTER — Encounter (HOSPITAL_COMMUNITY): Payer: Self-pay | Admitting: *Deleted

## 2012-08-23 ENCOUNTER — Inpatient Hospital Stay (HOSPITAL_COMMUNITY)
Admission: AD | Admit: 2012-08-23 | Discharge: 2012-08-27 | DRG: 101 | Disposition: A | Discharge status: Nursing Home (Includes ICF) | Payer: Medicare Other | Attending: Internal Medicine | Admitting: Internal Medicine

## 2012-08-23 DIAGNOSIS — Z8673 Personal history of transient ischemic attack (TIA), and cerebral infarction without residual deficits: Secondary | ICD-10-CM | POA: Diagnosis not present

## 2012-08-23 DIAGNOSIS — E785 Hyperlipidemia, unspecified: Secondary | ICD-10-CM | POA: Diagnosis present

## 2012-08-23 DIAGNOSIS — K759 Inflammatory liver disease, unspecified: Secondary | ICD-10-CM | POA: Diagnosis present

## 2012-08-23 DIAGNOSIS — R569 Unspecified convulsions: Secondary | ICD-10-CM

## 2012-08-23 DIAGNOSIS — R5381 Other malaise: Secondary | ICD-10-CM | POA: Diagnosis not present

## 2012-08-23 DIAGNOSIS — Z8249 Family history of ischemic heart disease and other diseases of the circulatory system: Secondary | ICD-10-CM

## 2012-08-23 DIAGNOSIS — R5383 Other fatigue: Secondary | ICD-10-CM | POA: Diagnosis not present

## 2012-08-23 DIAGNOSIS — R4182 Altered mental status, unspecified: Secondary | ICD-10-CM | POA: Diagnosis not present

## 2012-08-23 DIAGNOSIS — I10 Essential (primary) hypertension: Secondary | ICD-10-CM | POA: Diagnosis present

## 2012-08-23 DIAGNOSIS — F17200 Nicotine dependence, unspecified, uncomplicated: Secondary | ICD-10-CM | POA: Diagnosis present

## 2012-08-23 DIAGNOSIS — F329 Major depressive disorder, single episode, unspecified: Secondary | ICD-10-CM | POA: Diagnosis present

## 2012-08-23 DIAGNOSIS — G35 Multiple sclerosis: Secondary | ICD-10-CM | POA: Diagnosis present

## 2012-08-23 DIAGNOSIS — Z6831 Body mass index (BMI) 31.0-31.9, adult: Secondary | ICD-10-CM | POA: Diagnosis not present

## 2012-08-23 DIAGNOSIS — F411 Generalized anxiety disorder: Secondary | ICD-10-CM | POA: Diagnosis present

## 2012-08-23 DIAGNOSIS — Z833 Family history of diabetes mellitus: Secondary | ICD-10-CM

## 2012-08-23 DIAGNOSIS — G40909 Epilepsy, unspecified, not intractable, without status epilepticus: Secondary | ICD-10-CM | POA: Diagnosis not present

## 2012-08-23 DIAGNOSIS — E669 Obesity, unspecified: Secondary | ICD-10-CM | POA: Diagnosis present

## 2012-08-23 DIAGNOSIS — I6789 Other cerebrovascular disease: Secondary | ICD-10-CM | POA: Diagnosis not present

## 2012-08-23 DIAGNOSIS — F3289 Other specified depressive episodes: Secondary | ICD-10-CM | POA: Diagnosis present

## 2012-08-23 LAB — COMPREHENSIVE METABOLIC PANEL
ALT: 11 U/L (ref 0–35)
AST: 13 U/L (ref 0–37)
Albumin: 3.6 g/dL (ref 3.5–5.2)
CO2: 29 mEq/L (ref 19–32)
Calcium: 9.7 mg/dL (ref 8.4–10.5)
Creatinine, Ser: 0.56 mg/dL (ref 0.50–1.10)
GFR calc non Af Amer: >90 mL/min (ref >90)
Sodium: 137 mEq/L (ref 135–145)
Total Protein: 7.3 g/dL (ref 6.0–8.3)

## 2012-08-23 LAB — CBC WITH DIFFERENTIAL/PLATELET
Basophils Absolute: 0 10*3/uL (ref 0.0–0.1)
Basophils Relative: 0 % (ref 0–1)
Eosinophils Absolute: 0.2 10*3/uL (ref 0.0–0.7)
Hemoglobin: 13.8 g/dL (ref 12.0–15.0)
MCH: 28.2 pg (ref 26.0–34.0)
MCHC: 36.1 g/dL — ABNORMAL HIGH (ref 30.0–36.0)
Neutro Abs: 2.8 10*3/uL (ref 1.7–7.7)
Neutrophils Relative %: 44 % (ref 43–77)
Platelets: 287 10*3/uL (ref 150–400)
RDW: 13.8 % (ref 11.5–15.5)

## 2012-08-23 MED ORDER — LORAZEPAM 1 MG PO TABS
2.0000 mg | ORAL_TABLET | ORAL | Status: DC | PRN
  Administered 2012-08-24 – 2012-08-27 (×6): 2 mg via ORAL
  Filled 2012-08-23 (×6): qty 2

## 2012-08-23 MED ORDER — ASPIRIN 325 MG PO TABS
325.0000 mg | ORAL_TABLET | Freq: Every day | ORAL | Status: DC
  Administered 2012-08-24 – 2012-08-27 (×4): 325 mg via ORAL
  Filled 2012-08-23 (×5): qty 1

## 2012-08-23 MED ORDER — CYCLOBENZAPRINE HCL 10 MG PO TABS
10.0000 mg | ORAL_TABLET | Freq: Two times a day (BID) | ORAL | Status: DC
  Administered 2012-08-24 – 2012-08-27 (×8): 10 mg via ORAL
  Filled 2012-08-23 (×9): qty 1

## 2012-08-23 MED ORDER — PANTOPRAZOLE SODIUM 40 MG PO TBEC
40.0000 mg | DELAYED_RELEASE_TABLET | Freq: Every day | ORAL | Status: DC
  Administered 2012-08-24 – 2012-08-27 (×4): 40 mg via ORAL
  Filled 2012-08-23 (×5): qty 1

## 2012-08-23 MED ORDER — TRAZODONE HCL 50 MG PO TABS
200.0000 mg | ORAL_TABLET | Freq: Every day | ORAL | Status: DC
  Administered 2012-08-24 – 2012-08-26 (×4): 200 mg via ORAL
  Filled 2012-08-23 (×4): qty 4

## 2012-08-23 MED ORDER — ACETAMINOPHEN 325 MG PO TABS: 650.0000 mg | ORAL_TABLET | Freq: Four times a day (QID) | ORAL | Status: DC | PRN

## 2012-08-23 MED ORDER — LISINOPRIL 5 MG PO TABS
5.0000 mg | ORAL_TABLET | Freq: Every day | ORAL | Status: DC
  Administered 2012-08-24 – 2012-08-27 (×4): 5 mg via ORAL
  Filled 2012-08-23 (×5): qty 1

## 2012-08-23 MED ORDER — SODIUM CHLORIDE 0.9 % IJ SOLN: 3.0000 mL | Freq: Two times a day (BID) | INTRAMUSCULAR | Status: DC

## 2012-08-23 MED ORDER — ONDANSETRON HCL 4 MG/2ML IJ SOLN: 4.0000 mg | Freq: Four times a day (QID) | INTRAMUSCULAR | Status: DC | PRN

## 2012-08-23 MED ORDER — SODIUM CHLORIDE 0.9 % IJ SOLN: 3.0000 mL | INTRAMUSCULAR | Status: DC | PRN

## 2012-08-23 MED ORDER — INTERFERON BETA-1B 0.3 MG ~~LOC~~ KIT: 0.3000 mg | PACK | SUBCUTANEOUS | Status: DC

## 2012-08-23 MED ORDER — ACETAMINOPHEN 650 MG RE SUPP: 650.0000 mg | Freq: Four times a day (QID) | RECTAL | Status: DC | PRN

## 2012-08-23 MED ORDER — SODIUM CHLORIDE 0.9 % IV SOLN
1000.0000 mg | Freq: Two times a day (BID) | INTRAVENOUS | Status: DC
  Administered 2012-08-24: 1000 mg via INTRAVENOUS
  Filled 2012-08-23 (×6): qty 10

## 2012-08-23 MED ORDER — SIMVASTATIN 20 MG PO TABS
40.0000 mg | ORAL_TABLET | Freq: Every day | ORAL | Status: DC
  Administered 2012-08-24 – 2012-08-26 (×4): 40 mg via ORAL
  Filled 2012-08-23 (×4): qty 2

## 2012-08-23 MED ORDER — SENNOSIDES-DOCUSATE SODIUM 8.6-50 MG PO TABS: 1.0000 | ORAL_TABLET | Freq: Every evening | ORAL | Status: DC | PRN

## 2012-08-23 MED ORDER — ENOXAPARIN SODIUM 40 MG/0.4ML ~~LOC~~ SOLN
40.0000 mg | SUBCUTANEOUS | Status: DC
  Administered 2012-08-24 (×2): 40 mg via SUBCUTANEOUS
  Filled 2012-08-23 (×4): qty 0.4

## 2012-08-23 MED ORDER — ALUM & MAG HYDROXIDE-SIMETH 200-200-20 MG/5ML PO SUSP: 30.0000 mL | Freq: Four times a day (QID) | ORAL | Status: DC | PRN

## 2012-08-23 MED ORDER — VENLAFAXINE HCL ER 75 MG PO CP24
150.0000 mg | ORAL_CAPSULE | Freq: Every day | ORAL | Status: DC
  Administered 2012-08-24 – 2012-08-27 (×4): 150 mg via ORAL
  Filled 2012-08-23 (×5): qty 2

## 2012-08-23 MED ORDER — LORATADINE 10 MG PO TABS
10.0000 mg | ORAL_TABLET | Freq: Every day | ORAL | Status: DC
  Administered 2012-08-24 – 2012-08-27 (×4): 10 mg via ORAL
  Filled 2012-08-23 (×5): qty 1

## 2012-08-23 MED ORDER — METOPROLOL TARTRATE 25 MG PO TABS
25.0000 mg | ORAL_TABLET | Freq: Two times a day (BID) | ORAL | Status: DC
  Administered 2012-08-24 – 2012-08-27 (×8): 25 mg via ORAL
  Filled 2012-08-23 (×9): qty 1

## 2012-08-23 MED ORDER — MONTELUKAST SODIUM 10 MG PO TABS
10.0000 mg | ORAL_TABLET | Freq: Every day | ORAL | Status: DC
  Administered 2012-08-24 – 2012-08-26 (×4): 10 mg via ORAL
  Filled 2012-08-23 (×4): qty 1

## 2012-08-23 MED ORDER — SODIUM CHLORIDE 0.9 % IJ SOLN
3.0000 mL | Freq: Two times a day (BID) | INTRAMUSCULAR | Status: DC
  Administered 2012-08-24: 3 mL via INTRAVENOUS

## 2012-08-23 MED ORDER — SODIUM CHLORIDE 0.9 % IV SOLN: 250.0000 mL | INTRAVENOUS | Status: DC | PRN

## 2012-08-23 MED ORDER — HYDROCODONE-ACETAMINOPHEN 5-325 MG PO TABS
1.0000 | ORAL_TABLET | ORAL | Status: DC | PRN
  Administered 2012-08-23 – 2012-08-26 (×2): 1 via ORAL
  Filled 2012-08-23 (×2): qty 1

## 2012-08-23 MED ORDER — ONDANSETRON HCL 4 MG PO TABS: 4.0000 mg | ORAL_TABLET | Freq: Four times a day (QID) | ORAL | Status: DC | PRN

## 2012-08-23 NOTE — ED Notes (Signed)
Attempted to call report. Was advised nurse receiving pt will call this nurse back for report. 

## 2012-08-23 NOTE — ED Notes (Signed)
Pt sent by family for stroke like symptoms; upon arrival to ED pt is alert, demanding and requesting a bedside commode.  In no apparent distress.  Pt has hx of MS and stroke.

## 2012-08-23 NOTE — ED Provider Notes (Signed)
History    This chart was scribed for Tami Lennert, MD, by Frederik Pear, ED scribe. The patient was seen in room [facility] and the patient's care was started at 1538.    CSN: [id number]  Arrival date & time 08/23/12  1522   First MD Initiated Contact with Patient 08/23/12 1538      Chief Complaint  Patient presents with  . Altered Mental Status    (Consider location/radiation/quality/duration/timing/severity/associated sxs/prior treatment) Patient is a 52 y.o. female presenting with syncope. The history is provided by a relative. No language interpreter was used.  Loss of Consciousness  This is a new problem. The current episode started less than 1 hour ago. The problem has been rapidly improving. She lost consciousness for a period of less than one minute. The problem is associated with normal activity. Associated symptoms include headaches. Pertinent negatives include back pain, congestion and seizures. She has tried nothing for the symptoms.    Tami Nolan is a 52 y.o. female brought in by ambulance who presents to the Emergency Department complaining of sudden onset, rapidly improving syncope that began around 1500. Her sister reports that the pt's niece works at the nursing home where the she stays. She states that the niece called her to report that  her eyes rolled back in her head and she became unresponsive while sitting at a table having a conversation. She states that when she came to that she was unable to recognize anyone and seemed confused so she called EMS. She denies any shaking. 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 She also has a h/o of MS and reports generalized weakness in her bilateral legs that is worse on the right since the CVA. She states that she is ambulatory with a rolling walker and a gait belt at physical therapy, but is unable to walk on her own due to the  weakness. In ED, she also complains of a headache and her sister states that she has been hypertensive all weak.  Past Medical History  Diagnosis Date  . Hepatitis     ETOH related   . ETOH abuse   . Hx of abnormal cervical Pap smear 1997  . Colon polyps 1997  . Genital warts   . Multiple sclerosis     Past Surgical History  Procedure Laterality Date  . Bilatetral tubal ligation      Family History  Problem Relation Age of Onset  . Coronary artery disease Brother   . Diabetes Brother   . Hypertension Brother   . Hypertension Mother   . Diabetes Mother   . Hypertension Sister   . Alcohol abuse      family history     History  Substance Use Topics  . Smoking status: Current Every Day Smoker -- 1.00 packs/day for 15 years    Types: Cigarettes  . Smokeless tobacco: Never Used  . Alcohol Use: No     Comment: Hx of ETOH abuse - dry since 1997.    OB History   Grav Para Term Preterm Abortions TAB SAB Ect Mult Living                  Review of Systems  Constitutional: Negative for fatigue.  HENT: Negative for congestion, sinus pressure and ear discharge.   Eyes: Negative for discharge.  Respiratory: Negative for cough.   Cardiovascular:  Positive for syncope.  Gastrointestinal: Negative for diarrhea.  Genitourinary: Negative for frequency and hematuria.  Musculoskeletal: Negative for back pain.  Skin: Negative for rash.  Neurological: Positive for syncope and headaches. Negative for seizures.  Psychiatric/Behavioral: Negative for hallucinations.  All other systems reviewed and are negative.    Allergies  Review of patient's allergies indicates no known allergies.  Home Medications   Current Outpatient Rx  Name  Route  Sig  Dispense  Refill  . aspirin 325 MG tablet   Oral   Take 1 tablet (325 mg total) by mouth daily.   30 tablet   1   . cyclobenzaprine (FLEXERIL) 10 MG tablet   Oral   Take 1 tablet (10 mg total) by mouth 2 (two) times daily.   30  tablet   1   . feeding supplement (ENSURE COMPLETE) LIQD   Oral   Take 237 mLs by mouth 2 (two) times daily between meals.   60 Bottle   1   . HYDROcodone-acetaminophen (NORCO/VICODIN) 5-325 MG per tablet   Oral   Take 1 tablet by mouth every 4 (four) hours as needed. Pain   30 tablet   0   . Interferon Beta-1b (BETASERON) 0.3 MG KIT injection   Subcutaneous   Inject 0.3 mg into the skin every other day.   10 kit   1   . lisinopril (PRINIVIL,ZESTRIL) 5 MG tablet   Oral   Take 1 tablet (5 mg total) by mouth daily.   30 tablet   1     Hold for SBP<100   . loratadine (CLARITIN) 10 MG tablet   Oral   Take 1 tablet (10 mg total) by mouth daily.   30 tablet   1   . LORazepam (ATIVAN) 2 MG tablet   Oral   Take 1 tablet (2 mg total) by mouth every 4 (four) hours as needed for anxiety.   30 tablet   0   . meloxicam (MOBIC) 7.5 MG tablet   Oral   Take 1 tablet (7.5 mg total) by mouth daily as needed (pain).   30 tablet   1   . metoprolol tartrate (LOPRESSOR) 25 MG tablet   Oral   Take 1 tablet (25 mg total) by mouth 2 (two) times daily.   60 tablet   1   . montelukast (SINGULAIR) 10 MG tablet   Oral   Take 1 tablet (10 mg total) by mouth at bedtime.   30 tablet   1   . Multiple Vitamin (MULTIVITAMIN WITH MINERALS) TABS   Oral   Take 1 tablet by mouth daily.   30 tablet   1   . senna-docusate (SENOKOT-S) 8.6-50 MG per tablet   Oral   Take 1 tablet by mouth at bedtime as needed.   30 tablet   1   . simvastatin (ZOCOR) 40 MG tablet   Oral   Take 1 tablet (40 mg total) by mouth at bedtime.   30 tablet   1   . traZODone (DESYREL) 100 MG tablet   Oral   Take 1 tablet (100 mg total) by mouth at bedtime.   30 tablet   1   . venlafaxine XR (EFFEXOR-XR) 150 MG 24 hr capsule   Oral   Take 1 capsule (150 mg total) by mouth daily.   30 capsule   1     BP 169/102  Pulse 92  Temp(Src) 99.1 F (37.3 C) (Oral)  Resp 16  SpO2 95%  Physical Exam   Nursing note and vitals reviewed. Constitutional: She is oriented to person, place, and time. She appears well-developed.  HENT:  Head: Normocephalic and atraumatic.  Eyes: Conjunctivae and EOM are normal. No scleral icterus.  Neck: Neck supple. No thyromegaly present.  Cardiovascular: Normal rate and regular rhythm.  Exam reveals no gallop and no friction rub.   No murmur heard. Pulmonary/Chest: No stridor. She has no wheezes. She has no rales. She exhibits no tenderness.  Abdominal: She exhibits no distension. There is no tenderness. There is no rebound.  Musculoskeletal: Normal range of motion. She exhibits no edema.  Lymphadenopathy:    She has no cervical adenopathy.  Neurological: She is oriented to person, place, and time. Coordination normal.  Generalized weakness in her bilateral legs from a previous CVA and MS. Good upper body strength.  Skin: No rash noted. No erythema.  Psychiatric: She has a normal mood and affect. Her behavior is normal.    ED Course  Procedures (including critical care time)  DIAGNOSTIC STUDIES: Oxygen Saturation is 97% on room air, adequate by my interpretation.    COORDINATION OF CARE:  15:44- Discussed planned course of treatment with the patient, including a head CT without contrast, Troponin, CBC, metabolic panel, and EKG, who is agreeable at this time.  18:03 Recheck- The niece reports that when the symptoms began that her mouth began twitching and lasted for 5 minutes. She denies any other twitching or shaking. She is a pt at [facility]. She has a h/o of previous seizures in 1997. PCP is Dr. Felecia Shelling.  Results for orders placed during the hospital encounter of 08/23/12  CBC WITH DIFFERENTIAL      Result Value Range   WBC 6.4  4.0 - 10.5 K/uL   RBC 4.90  3.87 - 5.11 MIL/uL   Hemoglobin 13.8  12.0 - 15.0 g/dL   HCT 16.1  09.6 - 04.5 %   MCV 78.0  78.0 - 100.0 fL   MCH 28.2  26.0 - 34.0 pg   MCHC 36.1 (*) 30.0 - 36.0 g/dL   RDW  40.9  81.1 - 91.4 %   Platelets 287  150 - 400 K/uL   Neutrophils Relative 44  43 - 77 %   Neutro Abs 2.8  1.7 - 7.7 K/uL   Lymphocytes Relative 43  12 - 46 %   Lymphs Abs 2.7  0.7 - 4.0 K/uL   Monocytes Relative 10  3 - 12 %   Monocytes Absolute 0.7  0.1 - 1.0 K/uL   Eosinophils Relative 3  0 - 5 %   Eosinophils Absolute 0.2  0.0 - 0.7 K/uL   Basophils Relative 0  0 - 1 %   Basophils Absolute 0.0  0.0 - 0.1 K/uL  COMPREHENSIVE METABOLIC PANEL      Result Value Range   Sodium 137  135 - 145 mEq/L   Potassium 4.6  3.5 - 5.1 mEq/L   Chloride 101  96 - 112 mEq/L   CO2 29  19 - 32 mEq/L   Glucose, Bld 98  70 - 99 mg/dL   BUN 12  6 - 23 mg/dL   Creatinine, Ser 7.82  0.50 - 1.10 mg/dL   Calcium 9.7  8.4 - 95.6 mg/dL   Total Protein 7.3  6.0 - 8.3 g/dL   Albumin 3.6  3.5 - 5.2 g/dL   AST 13  0 - 37 U/L   ALT 11  0 -  35 U/L   Alkaline Phosphatase 69  39 - 117 U/L   Total Bilirubin 0.3  0.3 - 1.2 mg/dL   GFR calc non Af Amer >90  >90 mL/min   GFR calc Af Amer >90  >90 mL/min  TROPONIN I      Result Value Range   Troponin I <0.30  <0.30 ng/mL    Labs Reviewed  CBC WITH DIFFERENTIAL - Abnormal; Notable for the following:    MCHC 36.1 (*)    All other components within normal limits  COMPREHENSIVE METABOLIC PANEL  TROPONIN I   Ct Head Wo Contrast  08/23/2012  *RADIOLOGY REPORT*  Clinical Data: Altered mental status.  CT HEAD WITHOUT CONTRAST  Technique:  Contiguous axial images were obtained from the base of the skull through the vertex without contrast.  Comparison: MRI brain 04/05/2012.  CT head 04/05/2012.  Findings: The posterior limb left internal capsule infarct is now remote.  Extensive confluent periventricular and subcortical white matter hypoattenuation bilaterally is otherwise stable.  No acute cortical infarct, hemorrhage, or mass lesion is present.  The ventricles are proportionate to the degree of atrophy.  No significant extra-axial fluid collection is present.  A polyp  or mucous retention cyst is present along the inferior aspect of the right maxillary sinus.  The paranasal sinuses and mastoid air cells are otherwise clear.  The osseous skull is intact.  IMPRESSION:  1.  Remote lacunar infarct of the posterior limb left internal capsule. 2.  No acute intracranial abnormality. 3.  Stable atrophy and diffuse white matter disease.   Original Report Authenticated By: Marin Roberts, M.D.    No diagnosis found.    Date: 08/23/2012  Rate  78  Rhythm: normal sinus rhythm  QRS Axis: normal  Intervals: normal  ST/T Wave abnormalities: normal  Except poor r wave progression  Conduction Disutrbances:none  Narrative Interpretation:   Old EKG Reviewed: none available   MDM    The chart was scribed for me under my direct supervision.  I personally performed the history, physical, and medical decision making and all procedures in the evaluation of this patient.Tami Lennert, MD 08/23/12 [phone]

## 2012-08-24 ENCOUNTER — Encounter (HOSPITAL_COMMUNITY): Payer: Self-pay | Admitting: *Deleted

## 2012-08-24 DIAGNOSIS — G35 Multiple sclerosis: Secondary | ICD-10-CM | POA: Diagnosis not present

## 2012-08-24 DIAGNOSIS — R569 Unspecified convulsions: Secondary | ICD-10-CM | POA: Diagnosis not present

## 2012-08-24 DIAGNOSIS — I6789 Other cerebrovascular disease: Secondary | ICD-10-CM | POA: Diagnosis not present

## 2012-08-24 MED ORDER — LEVETIRACETAM 500 MG PO TABS
500.0000 mg | ORAL_TABLET | Freq: Two times a day (BID) | ORAL | Status: DC
  Administered 2012-08-24 – 2012-08-26 (×6): 500 mg via ORAL
  Filled 2012-08-24 (×8): qty 1

## 2012-08-24 MED ORDER — NICOTINE 14 MG/24HR TD PT24
14.0000 mg | MEDICATED_PATCH | Freq: Every day | TRANSDERMAL | Status: DC
  Administered 2012-08-25 – 2012-08-27 (×4): 14 mg via TRANSDERMAL
  Filled 2012-08-24 (×5): qty 1

## 2012-08-24 NOTE — H&P (Signed)
NAMEMITA, VALLO                ACCOUNT NO.:  [id number]  MEDICAL RECORD NO.:  [id number]  LOCATION:  A330                          FACILITY:  [facility]  PHYSICIAN:  Eldonna Neuenfeldt L. Juanetta Gosling, M.D.DATE OF BIRTH:  [birth year]  DATE OF ADMISSION:  08/23/2012 DATE OF DISCHARGE:  LH                             HISTORY & PHYSICAL   The patient of Dr. Letitia Neri.  REASON FOR ADMISSION:  Seizure.  HISTORY:  Ms. Gumbs is a 52 year old who came to the emergency department after an episode of syncope.  Her niece was there.  She was found to have rolled her eyes back in her head.  She became unresponsive and confused after the episode.  She did not have any tonic-clonic motion.  She had a stroke in October of this year and has had about a 4- month recovery, but it is better.  At baseline, she has multiple sclerosis.  She uses a rolling walker and a gait belt.  She has been complaining of headache as well.  She has not had any further activity in the emergency room.  PAST MEDICAL HISTORY:  Positive for hepatitis, history of ethanol abuse which is apparently in remission.  She has had abnormal cervical Pap smear, colon polyps, genital warts, multiple sclerosis.  Surgically, she has had a bilateral tubal ligation.  FAMILY HISTORY:  Positive for coronary artery disease in her brother, diabetes in her brother, hypertension in her brother, and hypertension and diabetes in her mother.  SOCIAL HISTORY:  She smokes about a pack a day for the last 15 years. She has not used alcohol in almost 20 years.  REVIEW OF SYSTEMS:  Essentially negative, otherwise.  She does not give me much history.  MEDICATIONS: 1. Aspirin 325 mg daily. 2. Flexeril 10 mg b.i.d. 3. Ensure 2 times daily between meals. 4. Norco 5/325 every 4 hours as needed for pain. 5. Betaseron 0.3 mg on the skin every other day. 6. Lisinopril 5 mg daily. 7. Loratadine 10 mg daily. 8. Lorazepam 2 mg every 4 hours as needed for anxiety. 9.  Meloxicam 7.5 mg daily as needed. 10.Metoprolol 25 mg b.i.d. 11.Singulair 10 mg daily. 12.Multiple vitamin daily. 13.Senokot 1 at bedtime. 14.Simvastatin 40 mg daily. 15.Trazodone 100 mg at bedtime. 16.Venlafaxine 150 mg daily.  PHYSICAL EXAMINATION:  VITAL SIGNS:  She was hypertensive, but that improved.  Initial blood pressure 169/102, pulse 92, temperature was 99.1, respirations 16, O2 sat 95%. GENERAL:  She was awake and alert, communicates somewhat slowly. HEENT:  Her pupils are reactive.  Nose and throat are clear.  Mucous membranes are moist. NECK:  Supple without masses, bruits, or JVD. CHEST:  Relatively clear. HEART:  Regular without gallop. ABDOMEN:  Soft.  No masses are felt. EXTREMITIES:  No edema. CENTRAL NERVOUS SYSTEM:  She has generalized weakness bilaterally in her legs.  Arms are pretty good.  LABORATORY DATA:  White blood count 6400, hemoglobin 13.8, platelets 287.  Electrolytes are normal.  BUN is 12, creatinine 0.56.  She had CT that shows remote lacunar infarct, stable atrophy.  ASSESSMENT:  I think she has probably had a seizure related to her stroke.  PLAN:  Plan is to  have her treated with intravenous seizure medications for the moment and then she will be treated with oral meds.  She may need Neurology consultation.  Dr. Felecia Shelling will assume her care in the morning.     Jebadiah Imperato L. Juanetta Gosling, M.D.     ELH/MEDQ  D:  08/23/2012  T:  08/24/2012  Job:  [id number]

## 2012-08-24 NOTE — Progress Notes (Signed)
Subjective:  Patient was admitted last night due to seizure disorder and was started on iv keppra. Patient is resting and her seizure is controlled.  Objective: Vital signs in last 24 hours: Temp:  [97.8 F (36.6 C)-99.1 F (37.3 C)] 97.8 F (36.6 C) (03/22 0500) Pulse Rate:  [73-92] 74 (03/22 0500) Resp:  [16-18] 16 (03/22 0500) BP: (146-169)/(86-102) 150/86 mmHg (03/22 0500) SpO2:  [95 %-97 %] 97 % (03/22 0500) Weight:  [90.855 kg (200 lb 4.8 oz)] 90.855 kg (200 lb 4.8 oz) (03/21 2100) Weight change:  Last BM Date: 08/22/12  Intake/Output from previous day: 03/21 0701 - 03/22 0700 In: 470 [P.O.:360; IV Piggyback:110] Out: 1050 [Urine:1050]  PHYSICAL EXAM General appearance: alert and no distress Resp: clear to auscultation bilaterally Cardio: S1, S2 normal GI: soft, non-tender; bowel sounds normal; no masses,  no organomegaly Extremities: extremities normal, atraumatic, no cyanosis or edema  Lab Results:    @labtest @ [facility] No results found for this basename: PHART, PCO2, PO2ART, TCO2, HCO3,  in the last 72 hours CULTURES No results found for this or any previous visit (from the past 240 hour(s)). Studies/Results: Ct Head Wo Contrast  08/23/2012  *RADIOLOGY REPORT*  Clinical Data: Altered mental status.  CT HEAD WITHOUT CONTRAST  Technique:  Contiguous axial images were obtained from the base of the skull through the vertex without contrast.  Comparison: MRI brain 04/05/2012.  CT head 04/05/2012.  Findings: The posterior limb left internal capsule infarct is now remote.  Extensive confluent periventricular and subcortical white matter hypoattenuation bilaterally is otherwise stable.  No acute cortical infarct, hemorrhage, or mass lesion is present.  The ventricles are proportionate to the degree of atrophy.  No significant extra-axial fluid collection is present.  A polyp or mucous retention cyst is present along the inferior aspect of the right maxillary sinus.  The  paranasal sinuses and mastoid air cells are otherwise clear.  The osseous skull is intact.  IMPRESSION:  1.  Remote lacunar infarct of the posterior limb left internal capsule. 2.  No acute intracranial abnormality. 3.  Stable atrophy and diffuse white matter disease.   Original Report Authenticated By: Marin Roberts, M.D.     Medications: I have reviewed the patient's current medications.  Assesment: 1. New onset seizure disorder 2. H/O CVA 3. Multiple sclerosis 4. Hypertension 5. Hyperlipidemia 6. Anxiety and depression disorder Active Problems:   * No active hospital problems. *    Plan: Will change Keppra to po Neurology consult Continue regular treatment.    LOS: 1 day   Tami Nolan 08/24/2012, 10:26 AM

## 2012-08-24 NOTE — Progress Notes (Signed)
Had a discussion with the patient about the importance of her IV access being restarted.  The patient agreed at the time and had T. Davene Costain RN attempt to restart her IV access.  Patient then refused at the time and the importance was restated by my co-worker.  Patient still refused at this time will inform the on-call MD.

## 2012-08-25 DIAGNOSIS — G35 Multiple sclerosis: Secondary | ICD-10-CM | POA: Diagnosis not present

## 2012-08-25 DIAGNOSIS — I6789 Other cerebrovascular disease: Secondary | ICD-10-CM | POA: Diagnosis not present

## 2012-08-25 DIAGNOSIS — R569 Unspecified convulsions: Secondary | ICD-10-CM | POA: Diagnosis not present

## 2012-08-25 NOTE — Progress Notes (Signed)
Subjective: Patient is resting. She feels better. No new complaint. Objective: Vital signs in last 24 hours: Temp:  [98 F (36.7 C)-98.5 F (36.9 C)] 98 F (36.7 C) (03/23 0539) Pulse Rate:  [72-88] 72 (03/23 0539) Resp:  [18-19] 18 (03/23 0539) BP: (137-149)/(89-97) 149/97 mmHg (03/23 0539) SpO2:  [100 %] 100 % (03/23 0539) Weight change:  Last BM Date: 08/22/12  Intake/Output from previous day: 03/22 0701 - 03/23 0700 In: 1800 [P.O.:1800] Out: 1350 [Urine:1350]  PHYSICAL EXAM General appearance: alert and no distress Resp: clear to auscultation bilaterally Cardio: S1, S2 normal GI: soft, non-tender; bowel sounds normal; no masses,  no organomegaly Extremities: extremities normal, atraumatic, no cyanosis or edema  Lab Results:    @labtest @ [facility] No results found for this basename: PHART, PCO2, PO2ART, TCO2, HCO3,  in the last 72 hours CULTURES No results found for this or any previous visit (from the past 240 hour(s)). Studies/Results: Ct Head Wo Contrast  08/23/2012  *RADIOLOGY REPORT*  Clinical Data: Altered mental status.  CT HEAD WITHOUT CONTRAST  Technique:  Contiguous axial images were obtained from the base of the skull through the vertex without contrast.  Comparison: MRI brain 04/05/2012.  CT head 04/05/2012.  Findings: The posterior limb left internal capsule infarct is now remote.  Extensive confluent periventricular and subcortical white matter hypoattenuation bilaterally is otherwise stable.  No acute cortical infarct, hemorrhage, or mass lesion is present.  The ventricles are proportionate to the degree of atrophy.  No significant extra-axial fluid collection is present.  A polyp or mucous retention cyst is present along the inferior aspect of the right maxillary sinus.  The paranasal sinuses and mastoid air cells are otherwise clear.  The osseous skull is intact.  IMPRESSION:  1.  Remote lacunar infarct of the posterior limb left internal capsule. 2.  No acute  intracranial abnormality. 3.  Stable atrophy and diffuse white matter disease.   Original Report Authenticated By: Marin Roberts, M.D.     Medications: I have reviewed the patient's current medications.  Assesment: 1. New onset seizure disorder 2. H/O CVA 3. Multiple sclerosis 4. Hypertension 5. Hyperlipidemia 6. Anxiety and depression disorder Active Problems:   * No active hospital problems. *    Plan: Continue Keppra Neurology consult Continue regular treatment.    LOS: 2 days   Zafar Debrosse 08/25/2012, 2:10 PM

## 2012-08-26 ENCOUNTER — Inpatient Hospital Stay (HOSPITAL_COMMUNITY)
Admit: 2012-08-26 | Discharge: 2012-08-26 | Disposition: A | Discharge status: Home / Self Care | Payer: Medicare Other | Attending: Internal Medicine | Admitting: Internal Medicine

## 2012-08-26 DIAGNOSIS — R569 Unspecified convulsions: Secondary | ICD-10-CM | POA: Diagnosis not present

## 2012-08-26 DIAGNOSIS — R4182 Altered mental status, unspecified: Secondary | ICD-10-CM | POA: Diagnosis not present

## 2012-08-26 DIAGNOSIS — G35 Multiple sclerosis: Secondary | ICD-10-CM | POA: Diagnosis not present

## 2012-08-26 DIAGNOSIS — I6789 Other cerebrovascular disease: Secondary | ICD-10-CM | POA: Diagnosis not present

## 2012-08-26 NOTE — Consult Note (Signed)
[facility] Robel Wuertz A. Gerilyn Pilgrim, MD     [email or web address]          Tami Nolan is an 52 y.o. female.   ASSESSMENT/PLAN: The patient presents with a spell of confusion associated with amnesia and unresponsiveness. The spell could be a nonconvulsive seizure. The patient has been started on Keppra although this may be premature as she apparently has had only one spell. An EEG however will be obtained and hopefully this will guide our treatment.  Multiple sclerosis at baseline. She has significant impairment including gait disorder and cognitive impairment/dementia. The patient currently is on Betaseron. I think however that we should consider switching to another agent. This was done in the outpatient setting however.   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  Significant behavioral disruption likely due to cognitive impairment from multiple sclerosis.  The patient is a 52 year old black female who has a baseline history of multiple sclerosis. We last saw the patient several years ago and at that time she was ambulating with a walker. The family tells me that she is mostly in a wheelchair at this time. The gait impairment may have gotten significantly worse after a recent infarct in November 2013. The mother reports that the patient was in a nursing home facility/skilled rehabilitation facility for several months. She is now at a rest home. It appears that the patient had a spell of unresponsiveness leaning towards the left and possibly some twitching of the left facial muscles. She was unresponsive with drooling of the mouth. No clear tonic-clonic activity however. The patient has been amnestic to the event and the period surrounding the event. However, the mother reports that the patient was quite belligerent during after the events. The mother reports that over the past 2 weeks, she has had episodes of belligerence and noncompliance with care.  The patient herself reports that she wants to be free to do stuff in her house. Currently, the patient thinks a lot of time that she is actually at home. She wants to do things such cook her food or smoke but forgets that she is at a rest home facility. Mother reports that the patient is mostly in a wheelchair. She can ambulate about 10 feet with physical therapy and using a rolling walker. She does complain of right shoulder pain and actually is requesting pain medication for this. She apparently fell on this about 2 years ago. The patient does not report other symptoms at this time. She denies headaches, chest pain, shortness of breath or GI symptoms. The mother reports that after the patient's stroke, she went in to the patient's house and saw over 50 containers of the patient's Betaseron indicating the patient was obvious and not compliant with this.  GENERAL: This a pleasant obese lady in no acute distress.  HEENT: Unremarkable.  ABDOMEN: soft  EXTREMITIES: No edema   BACK: Unremarkable.  SKIN: Normal by inspection.    MENTAL STATUS: Alert and oriented. Speech, language and cognition are generally intact. Judgment and insight normal.   CRANIAL NERVES: Pupils are equal, round and reactive to light and accomodation; extra ocular movements are full, there is no significant nystagmus; visual fields are full; upper and lower facial muscles are normal in strength and symmetric, there is no flattening of the nasolabial folds; tongue is midline; uvula is midline; shoulder elevation is normal.  MOTOR: She has a classic right upper extremity pronator drift. Right deltoid is 4 minus. She has weakness of triceps  graded as 4+/5. Hand grip 5. Left deltoid 4+/5. Hand grip 5. Legs show slightly increased tone but strength is 5/5.  COORDINATION: Left finger to nose is normal, right finger to nose is normal, No rest tremor; no intention tremor; no postural tremor; no bradykinesia.  REFLEXES: Deep tendon  reflexes are symmetrical and normal.   SENSATION: Normal to light touch.  GAIT: The patient requires maximum assistance to stand. Station is wide.    Past Medical History  Diagnosis Date  . Hepatitis     ETOH related   . ETOH abuse   . Hx of abnormal cervical Pap smear 1997  . Colon polyps 1997  . Genital warts   . Multiple sclerosis     Past Surgical History  Procedure Laterality Date  . Bilatetral tubal ligation      Family History  Problem Relation Age of Onset  . Coronary artery disease Brother   . Diabetes Brother   . Hypertension Brother   . Hypertension Mother   . Diabetes Mother   . Hypertension Sister   . Alcohol abuse      family history     Social History:  reports that she has been smoking Cigarettes.  She has a 15 pack-year smoking history. She uses smokeless tobacco. She reports that she does not drink alcohol or use illicit drugs.  Allergies: No Known Allergies  Medications: Prior to Admission medications   Medication Sig Start Date End Date Taking? Authorizing Provider  aspirin 325 MG tablet Take 1 tablet (325 mg total) by mouth daily. 08/02/12  Yes [name], MD  cyclobenzaprine (FLEXERIL) 10 MG tablet Take 1 tablet (10 mg total) by mouth 2 (two) times daily. 08/02/12  Yes [name], MD  HYDROcodone-acetaminophen (NORCO/VICODIN) 5-325 MG per tablet Take 1 tablet by mouth every 4 (four) hours as needed. Pain 08/02/12  Yes [name], MD  Interferon Beta-1b (BETASERON) 0.3 MG KIT injection Inject 0.3 mg into the skin every other day. 08/02/12  Yes [name], MD  lisinopril (PRINIVIL,ZESTRIL) 5 MG tablet Take 1 tablet (5 mg total) by mouth daily. 08/02/12  Yes [name], MD  loratadine (CLARITIN) 10 MG tablet Take 1 tablet (10 mg total) by mouth daily. 08/02/12  Yes [name], MD  LORazepam (ATIVAN) 2 MG tablet Take 1 tablet (2 mg total) by mouth every 4 (four) hours as needed for anxiety. 08/02/12  Yes [name], MD    meloxicam (MOBIC) 7.5 MG tablet Take 1 tablet (7.5 mg total) by mouth daily as needed (pain). 08/02/12  Yes [name], MD  metoprolol tartrate (LOPRESSOR) 25 MG tablet Take 1 tablet (25 mg total) by mouth 2 (two) times daily. 08/02/12  Yes [name], MD  montelukast (SINGULAIR) 10 MG tablet Take 1 tablet (10 mg total) by mouth at bedtime. 08/02/12  Yes [name], MD  Multiple Vitamin (MULTIVITAMIN WITH MINERALS) TABS Take 1 tablet by mouth daily. 08/02/12  Yes [name], MD  simvastatin (ZOCOR) 40 MG tablet Take 1 tablet (40 mg total) by mouth at bedtime. 08/02/12  Yes [name], MD  traZODone (DESYREL) 100 MG tablet Take 200 mg by mouth at bedtime. 08/02/12  Yes [name], MD  venlafaxine XR (EFFEXOR-XR) 150 MG 24 hr capsule Take 1 capsule (150 mg total) by mouth daily. 08/02/12  Yes [name], MD  senna-docusate (SENOKOT-S) 8.6-50 MG per tablet Take 1 tablet by mouth at  bedtime as needed. 08/02/12   [name], MD    Scheduled Meds: . aspirin  325 mg Oral Daily  . cyclobenzaprine  10 mg Oral BID  . enoxaparin (LOVENOX) injection  40 mg Subcutaneous Q24H  . Interferon Beta-1b  0.3 mg Subcutaneous QODAY  . levETIRAcetam  500 mg Oral BID  . lisinopril  5 mg Oral Daily  . loratadine  10 mg Oral Daily  . metoprolol tartrate  25 mg Oral BID  . montelukast  10 mg Oral QHS  . nicotine  14 mg Transdermal Daily  . pantoprazole  40 mg Oral Q1200  . simvastatin  40 mg Oral QHS  . traZODone  200 mg Oral QHS  . venlafaxine XR  150 mg Oral Daily   Continuous Infusions:  PRN Meds:.acetaminophen, acetaminophen, alum & mag hydroxide-simeth, HYDROcodone-acetaminophen, LORazepam, ondansetron, senna-docusate   Blood pressure 126/83, pulse 87, temperature 97.9 F (36.6 C), temperature source Oral, resp. rate 15, height 5\' 7"  (1.702 m), weight 90.855 kg (200 lb 4.8 oz), SpO2 98.00%.   No results found for this or any previous visit (from the past 48 hour(s)).  No  results found.      Suzan Manon A. Gerilyn Pilgrim, M.D.  Diplomate, Biomedical engineer of Psychiatry and Neurology ( Neurology). 08/26/2012, 8:43 AM

## 2012-08-26 NOTE — Evaluation (Signed)
Physical Therapy Evaluation Patient Details Name: Tami Nolan MRN: [id number] DOB: 1961-05-22 Today's Date: 08/26/2012 Time: 9562-1308 PT Time Calculation (min): 33 min  PT Assessment / Plan / Recommendation Clinical Impression  Pt has decreased strength in R LE, decreased balance and increased flexor tone in R LE when stressed who will benefit from SNF.  Pt states she is not agreeable to this at this point but pt needs 24 hr care and is a max transfer at this time.    PT Assessment  Patient needs continued PT services    Follow Up Recommendations  CIR    Does the patient have the potential to tolerate intense rehabilitation    yes  Barriers to Discharge None      Equipment Recommendations  None recommended by PT    Recommendations for Other Services OT consult   Frequency Min 5X/week    Precautions / Restrictions Precautions Precautions: Fall Restrictions Weight Bearing Restrictions: No   0/10      Mobility  Bed Mobility Bed Mobility: Not assessed (Pt sitting on side of bed when therapist entered.) Transfers Transfers: Sit to Stand;Stand to Sit;Stand Pivot Transfers Sit to Stand: 2: Max assist Stand to Sit: 3: Mod assist Stand Pivot Transfers: 1: +1 Total assist Ambulation/Gait Ambulation/Gait Assistance: Not tested (comment) Assistive device: Rolling walker        PT Diagnosis: Difficulty walking;Hemiplegia non-dominant side  PT Problem List: Decreased strength;Decreased balance;Decreased mobility;Decreased safety awareness PT Treatment Interventions: Gait training;Therapeutic activities;Therapeutic exercise;Balance training   PT Goals Acute Rehab PT Goals PT Goal Formulation: With patient Time For Goal Achievement: 08/29/12 Potential to Achieve Goals: Good Pt will go Supine/Side to Sit: with min assist PT Goal: Supine/Side to Sit - Progress: Goal set today Pt will go Sit to Stand: with min assist PT Goal: Sit to Stand - Progress: Goal set today Pt  will Transfer Bed to Chair/Chair to Bed: with min assist PT Transfer Goal: Bed to Chair/Chair to Bed - Progress: Goal set today Pt will Ambulate: 1 - 15 feet;with mod assist;with rolling walker PT Goal: Ambulate - Progress: Goal set today  Visit Information  Last PT Received On: 08/26/12    Subjective Data  Subjective: Pt states she lives with her niece since her stroke.  Pt usually ambulatory with her walker Patient Stated Goal: To walk again   Prior Functioning  Home Living Lives With: Family Type of Home: House Home Access: Level entry Bathroom Shower/Tub: Engineer, manufacturing systems: Standard Home Adaptive Equipment: Walker - rolling Prior Function Level of Independence: Independent with assistive device(s) Driving: No Vocation: On disability Communication Communication: No difficulties    Cognition  Cognition Overall Cognitive Status: Impaired Area of Impairment: Attention;Safety/judgement;Problem solving Arousal/Alertness: Awake/alert Behavior During Session: WFL for tasks performed Safety/Judgement: Decreased awareness of safety precautions;Decreased safety judgement for tasks assessed    Extremity/Trunk Assessment Right Lower Extremity Assessment RLE ROM/Strength/Tone: Deficits RLE ROM/Strength/Tone Deficits: knee mm 3/5  hip mm 3-/5- pt exhibits flexor tone when standing will not bear weight on R LE.  Attempted to get pt to shift her weight to her right LE but pt was unable to do this. Left Lower Extremity Assessment LLE ROM/Strength/Tone: Within functional levels   Balance Balance Balance Assessed: Yes Dynamic Sitting Balance Reach (Patient is able to reach ___ inches to right, left, forward, back): Pt loses balance to the back when reaching greater than 12" Static Standing Balance Static Standing - Balance Support: Bilateral upper extremity supported Static Standing - Level  of Assistance: 2: Max Therapist, music Standing - Comment/# of Minutes: LESS THAN ONE   End of Session PT - End of Session Equipment Utilized During Treatment: Gait belt Patient left: in chair;with call bell/phone within reach;with chair alarm set  GP     Kemya Shed,CINDY 08/26/2012, 10:29 AM

## 2012-08-26 NOTE — Clinical Social Work Psychosocial (Signed)
Clinical Social Work Department BRIEF PSYCHOSOCIAL ASSESSMENT 08/26/2012  Patient:  Tami Nolan, Tami Nolan     Account Number:  [id number]     Admit date:  08/23/2012  Clinical Social Worker:  [name]  Date/Time:  08/26/2012 04:10 PM  Referred by:  CSW  Date Referred:  08/26/2012 Referred for  ALF Placement   Other Referral:   Interview type:  Patient Other interview type:   sister- Tami Nolan  niece- Tami Nolan (also Production designer, theatre/television/film at [employer])    PSYCHOSOCIAL DATA Living Status:  FACILITY Admitted from facility:  OTHER Level of care:  Assisted Living Primary support name:  Tami Nolan Primary support relationship to patient:  SIBLING Degree of support available:   supportive    CURRENT CONCERNS Current Concerns  Post-Acute Placement   Other Concerns:    SOCIAL WORK ASSESSMENT / PLAN CSW met with pt and pt's sister Tami Nolan this morning at bedside. Pt was placed at her niece's facility, [facility] in February. Pt is currently requiring more assistance. She can become very upset, especially when reminded that she cannot smoke inside the facility. Pt alert and oriented to self and place. She believes it is October 2000 something. Pt was evaluated by PT today and recommendation was for SNF. Pt is currently in observation. CSW explained situation to pt and Tami Nolan, pt's niece who is Production designer, theatre/television/film at [employer]. She feels they can continue to meet pt's needs, but will need to move pt to their sister facility, [facility] as there is more Nolan available to assist pt there. CSW went in to explain this to pt this afternoon with CM. She did not remember CSW visit earlier in the day and was cursing. She states she wants to be in [place] so she can ride her bike. She is very upset with family for taking her bike away. CSW reminded pt that she stood up with max assist today and did not ambulate. She feels that she can go ride he bike now. Pt called Tami Nolan and told her she  wanted to return to her facility. Pt was unable to follow conversation and appeared confused. MD ordered telepsych for capacity evaluation.   Assessment/plan status:  Psychosocial Support/Ongoing Assessment of Needs Other assessment/ plan:   Information/referral to community resources:   Telepsych consult    PATIENT'S/FAMILY'S RESPONSE TO PLAN OF CARE: Pt very confrontation and confused during assessment. CSW will follow up with Telepsych recommendations to determine if pt has capacity or if family will need to make decisions.        Tami Nolan, [place] [phone]

## 2012-08-26 NOTE — Progress Notes (Signed)
Offsite EEG completed [facility].

## 2012-08-26 NOTE — Progress Notes (Signed)
UR Chart Review Completed  

## 2012-08-26 NOTE — Care Management Note (Signed)
    Page 1 of 1   08/27/2012     4:10:30 PM   CARE MANAGEMENT NOTE 08/27/2012  Patient:  Tami Nolan, Tami Nolan   Account Number:  [id number]  Date Initiated:  08/26/2012  Documentation initiated by:  Sharrie Rothman  Subjective/Objective Assessment:   Pt admitted from [facility]. Pt was admitted with seizures. Facility looking to have pt placed in SNF.     Action/Plan:   Neurology consult pending. PT evaluating pt. CSW is aware and will arrange discharge to facility when medically stable.   Anticipated DC Date:  08/27/2012   Anticipated DC Plan:  ASSISTED LIVING / REST HOME  In-house referral  Clinical Social Worker      DC Planning Services  CM consult      Choice offered to / List presented to:             Status of service:  Completed, signed off Medicare Important Message given?  YES (If response is "NO", the following Medicare IM given date fields will be blank) Date Medicare IM given:  08/27/2012 Date Additional Medicare IM given:    Discharge Disposition:  HOME/SELF CARE  Per UR Regulation:    If discussed at Long Length of Stay Meetings, dates discussed:    Comments:  08/27/12 1610 Arlyss Queen, RN BSN CM Pt discharged to [facility]. CSW to arrange discharge to facility.  08/26/12 1150 Arlyss Queen, RN BSN CM

## 2012-08-26 NOTE — Progress Notes (Signed)
Subjective: Patient is doing better. She had no seizure since admission. Objective: Vital signs in last 24 hours: Temp:  [97.9 F (36.6 C)-98.2 F (36.8 C)] 97.9 F (36.6 C) (03/24 0500) Pulse Rate:  [77-90] 87 (03/24 0500) Resp:  [15-18] 15 (03/24 0500) BP: (126-132)/(74-90) 126/83 mmHg (03/24 0500) SpO2:  [98 %-100 %] 98 % (03/24 0500) Weight change:  Last BM Date: 08/22/12  Intake/Output from previous day: 03/23 0701 - 03/24 0700 In: 720 [P.O.:720] Out: 1050 [Urine:1050]  PHYSICAL EXAM General appearance: alert and no distress Resp: clear to auscultation bilaterally Cardio: S1, S2 normal GI: soft, non-tender; bowel sounds normal; no masses,  no organomegaly Extremities: extremities normal, atraumatic, no cyanosis or edema  Lab Results:    @labtest @ [facility] No results found for this basename: PHART, PCO2, PO2ART, TCO2, HCO3,  in the last 72 hours CULTURES No results found for this or any previous visit (from the past 240 hour(s)). Studies/Results: No results found.  Medications: I have reviewed the patient's current medications.  Assesment: 1. New onset seizure disorder 2. H/O CVA 3. Multiple sclerosis 4. Hypertension 5. Hyperlipidemia 6. Anxiety and depression disorder Active Problems:   * No active hospital problems. *    Plan: Continue Keppra Will do EEG Neurology consult pending Continue regular treatment.    LOS: 3 days   Lettie Czarnecki 08/26/2012, 8:00 AM

## 2012-08-27 DIAGNOSIS — I6789 Other cerebrovascular disease: Secondary | ICD-10-CM | POA: Diagnosis not present

## 2012-08-27 DIAGNOSIS — R569 Unspecified convulsions: Secondary | ICD-10-CM | POA: Diagnosis not present

## 2012-08-27 DIAGNOSIS — G35 Multiple sclerosis: Secondary | ICD-10-CM | POA: Diagnosis not present

## 2012-08-27 DIAGNOSIS — R4182 Altered mental status, unspecified: Secondary | ICD-10-CM | POA: Diagnosis not present

## 2012-08-27 MED ORDER — LEVETIRACETAM 500 MG PO TABS: 500.0000 mg | ORAL_TABLET | Freq: Two times a day (BID) | ORAL | Status: DC

## 2012-08-27 NOTE — Progress Notes (Signed)
Patient took tele leads off and refuses to put back on.  Central tele notified and MD paged to see  If we can DC the tele order.  Awaiting call back.  Patient also refused meds this AM.  Stated she would only take them if i let her borrow a wheelchair.  Told patient that we did not need a wheel chair at this time since she is not being discharged yet.  Pt is very adament about going home.  Pt left sitting on bed, bed alarm on and shoes are on

## 2012-08-27 NOTE — Procedures (Signed)
NAMELOLETA, FROMMELT                ACCOUNT NO.:  [id number]  MEDICAL RECORD NO.:  [id number]  LOCATION:  EE                           FACILITY:  [facility]  PHYSICIAN:  Suzette Flagler A. Gerilyn Pilgrim, M.D. DATE OF BIRTH:  [birth year]  DATE OF PROCEDURE:  08/26/2012 DATE OF DISCHARGE:  08/26/2012                             EEG INTERPRETATION   INDICATIONS:  This is a 52 year old who presents with a single spell that appears to be seizure, __________ unresponsive, frothing at the mouth and eye rolled back.  However, there is no clonic-tonic activity.  MEDICATIONS:  Keppra, aspirin, Flexeril, Lovenox, Lopressor, Betaseron, Protonix, trazodone, Effexor, and NicoDerm patch.  ANALYSIS:  A 16-channel recording using standard 10/20 measurements is conducted for 21 minutes.  There is a well-formed posterior dominant rhythm of 8 Hz, which attenuates with eye opening.  There is beta activity observed in the frontal areas.  Awake and sleep activities are recorded.  Most of the recording showed stage II non-REM sleep with K complexes and sleep spindles.  There is no focal or lateralized slowing. There is no epileptiform activity observed.  IMPRESSION:  Normal recording of awake and sleep states.     Joelynn Dust A. Gerilyn Pilgrim, M.D.     KAD/MEDQ  D:  08/27/2012  T:  08/27/2012  Job:  [id number]

## 2012-08-27 NOTE — Progress Notes (Signed)
Patient asking for cigarette, yelling into hallway at people as they walk by. Patient denies telling sister she will kill herself if she returns to the group home. ACT team to come see her.

## 2012-08-27 NOTE — Clinical Social Work Note (Signed)
Pt d/c today. Telepsych yesterday felt pt lacked capacity. CSW notified [name] at facility who reports pt's sister is pursuing guardianship as this is her baseline. [name] called CSW and stated that pt called her and told her if she goes anywhere besides her sister's house she will kill herself. This is not an option at this time. ACT team consult made and pt is not suicidal. MD notified. [name] reports they are moving pt's bed this afternoon to [facility] where pt will be moving and will come pick pt up this evening. D/C summary and FL2 faxed. Glee Arvin is aware script was sent to [facility]. CSW made follow up appointment with [facility] for Thursday between the hours of 8-11. Information written on d/c instructions. Pt's sister notified of d/c.   Tami Fennel, LCSW [phone]

## 2012-08-27 NOTE — Discharge Summary (Signed)
Physician Discharge Summary  Patient ID: KEYAH BLIZARD MRN: [id number] DOB/AGE: [birth year] 53 y.o. Primary Care Physician:Macario Shear, MD Admit date: 08/23/2012 Discharge date: 08/27/2012    Discharge Diagnoses:  1. New onset seizure disorder  2. H/O CVA  3. Multiple sclerosis  4. Hypertension  5. Hyperlipidemia  6. Anxiety and depression disorder   Active Problems:   * No active hospital problems. *     Medication List    TAKE these medications       aspirin 325 MG tablet  Take 1 tablet (325 mg total) by mouth daily.     cyclobenzaprine 10 MG tablet  Commonly known as:  FLEXERIL  Take 1 tablet (10 mg total) by mouth 2 (two) times daily.     HYDROcodone-acetaminophen 5-325 MG per tablet  Commonly known as:  NORCO/VICODIN  Take 1 tablet by mouth every 4 (four) hours as needed. Pain     Interferon Beta-1b 0.3 MG Kit injection  Commonly known as:  BETASERON  Inject 0.3 mg into the skin every other day.     levETIRAcetam 500 MG tablet  Commonly known as:  KEPPRA  Take 1 tablet (500 mg total) by mouth 2 (two) times daily.     lisinopril 5 MG tablet  Commonly known as:  PRINIVIL,ZESTRIL  Take 1 tablet (5 mg total) by mouth daily.     loratadine 10 MG tablet  Commonly known as:  CLARITIN  Take 1 tablet (10 mg total) by mouth daily.     LORazepam 2 MG tablet  Commonly known as:  ATIVAN  Take 1 tablet (2 mg total) by mouth every 4 (four) hours as needed for anxiety.     meloxicam 7.5 MG tablet  Commonly known as:  MOBIC  Take 1 tablet (7.5 mg total) by mouth daily as needed (pain).     metoprolol tartrate 25 MG tablet  Commonly known as:  LOPRESSOR  Take 1 tablet (25 mg total) by mouth 2 (two) times daily.     montelukast 10 MG tablet  Commonly known as:  SINGULAIR  Take 1 tablet (10 mg total) by mouth at bedtime.     multivitamin with minerals Tabs  Take 1 tablet by mouth daily.     senna-docusate 8.6-50 MG per tablet  Commonly known as:  Senokot-S   Take 1 tablet by mouth at bedtime as needed.     simvastatin 40 MG tablet  Commonly known as:  ZOCOR  Take 1 tablet (40 mg total) by mouth at bedtime.     traZODone 100 MG tablet  Commonly known as:  DESYREL  Take 200 mg by mouth at bedtime.     venlafaxine XR 150 MG 24 hr capsule  Commonly known as:  EFFEXOR-XR  Take 1 capsule (150 mg total) by mouth daily.        Discharged Condition: improved    Consults: neurology  Significant Diagnostic Studies: Ct Head Wo Contrast  08/23/2012  *RADIOLOGY REPORT*  Clinical Data: Altered mental status.  CT HEAD WITHOUT CONTRAST  Technique:  Contiguous axial images were obtained from the base of the skull through the vertex without contrast.  Comparison: MRI brain 04/05/2012.  CT head 04/05/2012.  Findings: The posterior limb left internal capsule infarct is now remote.  Extensive confluent periventricular and subcortical white matter hypoattenuation bilaterally is otherwise stable.  No acute cortical infarct, hemorrhage, or mass lesion is present.  The ventricles are proportionate to the degree of atrophy.  No significant extra-axial fluid  collection is present.  A polyp or mucous retention cyst is present along the inferior aspect of the right maxillary sinus.  The paranasal sinuses and mastoid air cells are otherwise clear.  The osseous skull is intact.  IMPRESSION:  1.  Remote lacunar infarct of the posterior limb left internal capsule. 2.  No acute intracranial abnormality. 3.  Stable atrophy and diffuse white matter disease.   Original Report Authenticated By: Marin Roberts, M.D.     Lab Results: Basic Metabolic Panel: No results found for this basename: NA, K, CL, CO2, GLUCOSE, BUN, CREATININE, CALCIUM, MG, PHOS,  in the last 72 hours Liver Function Tests: No results found for this basename: AST, ALT, ALKPHOS, BILITOT, PROT, ALBUMIN,  in the last 72 hours   CBC: No results found for this basename: WBC, NEUTROABS, HGB, HCT, MCV,  PLT,  in the last 72 hours  No results found for this or any previous visit (from the past 240 hour(s)).   Hospital Course:   This is a 52 years old female patient with history of multiple medical illnesses who was admitted from local assisted living due to seizure. She was started on Keppra and neurology consult was done. EEG was done but the result was not available at the time discharge. Her neurologist advised to keep her on Keppra and he will make decision when he sees her in his office.  Discharge Exam: Blood pressure 135/92, pulse 80, temperature 98.7 F (37.1 C), temperature source Oral, resp. rate 20, height 5\' 7"  (1.702 m), weight 90.855 kg (200 lb 4.8 oz), SpO2 98.00%.    Disposition:  Assisted living.       Future Appointments Provider Department Dept Phone   11/13/2012 10:30 AM Nilda Riggs, NP [facility] [phone]      Follow-up Information   Follow up with Beryle Beams, MD On 09/26/2012. (at 11:15 am)    Contact information:   [address] [place] [address] [phone]       Follow up with [facility], MD In 1 week.   Contact information:   [address] [place] [address] [phone]       Signed: [name] 08/27/2012, 3:59 PM

## 2012-08-27 NOTE — Progress Notes (Signed)
Patient being discharged. Patient's sister arrived to transport patient to group home. Patient is alert and agitated but in stable condition. Patient's sister given discharge instructions by Loletta Specter, RN. Patient transported to main entrance in wheelchair by NT.

## 2012-08-27 NOTE — Progress Notes (Signed)
Dr. Felecia Shelling notified that patient refuses to wear tele. Tele order D/C'd per protocol and MD verbal order.

## 2012-08-27 NOTE — Progress Notes (Signed)
Patient notified me that her niece Pieter Partridge) called and told patient she would have to have EMS transport her home. I called Pieter Partridge to clarify. [name] did tell patient she would have to transport via EMS. [name] notified that patient does not quality to transport via EMS. [name] disagreed but asked if she could call me back. When she called back a couple minutes later, she said that someone would be coming to pick the patient up and that they would call when they arrived and would wait at the main entrance.

## 2012-08-27 NOTE — BH Assessment (Signed)
Assessment Note   Tami Nolan is an 52 y.o. female. The patient came to the ED after an episode of syncope on 08/23/2012. She was admitted to the hospital  For possible new seizure disorder. She is now ready for discharge and to return to the family care home where she has been residing. She is upset and does not want to return. She states she wants to go to her sister's home and live. To this point the sister has not voluntered to allow the patient to live with her. A family member reported that the patient called from the hospital, and told her that she would kill herself if she had to return to the home. It is reported that the patient only wants to go to her sister's because her bicycle is there, however the patient is unable to ride it due to changes in her physical health.  It is also reported that the is upset that she must pay to reside in the family care home, which is operated by family;y members. Today the patient is oriented only to name and place. She denies any hallucinations and does not appear to be delusional. She denies any past or present suicidal ideation. She is not homicidal and has no history of violence. She is argumentative and speaks loudly. She had been demanding cigarettes. She was seen by tele-psych for capacity, and the physician did feel she did not have capacity.  At this time there is no indications that inpatient care is needed. However patient should be seen for further evaluation by local mental health professional.   Axis I: Adjustment Disorder with Mixed Disturbance of Emotions and Conduct Axis II: Deferred Axis III:  Past Medical History  Diagnosis Date  . Hepatitis     ETOH related   . ETOH abuse   . Hx of abnormal cervical Pap smear 1997  . Colon polyps 1997  . Genital warts   . Multiple sclerosis    Axis IV: housing problems, problems related to social environment and problems with primary support group Axis V: 41-50 serious symptoms  Past Medical  History:  Past Medical History  Diagnosis Date  . Hepatitis     ETOH related   . ETOH abuse   . Hx of abnormal cervical Pap smear 1997  . Colon polyps 1997  . Genital warts   . Multiple sclerosis     Past Surgical History  Procedure Laterality Date  . Bilatetral tubal ligation      Family History:  Family History  Problem Relation Age of Onset  . Coronary artery disease Brother   . Diabetes Brother   . Hypertension Brother   . Hypertension Mother   . Diabetes Mother   . Hypertension Sister   . Alcohol abuse      family history     Social History:  reports that she has been smoking Cigarettes.  She has a 15 pack-year smoking history. She uses smokeless tobacco. She reports that she does not drink alcohol or use illicit drugs.  Additional Social History:     CIWA: CIWA-Ar BP: 135/92 mmHg Pulse Rate: 80 COWS:    Allergies: No Known Allergies  Home Medications:  Medications Prior to Admission  Medication Sig Dispense Refill  . aspirin 325 MG tablet Take 1 tablet (325 mg total) by mouth daily.  30 tablet  1  . cyclobenzaprine (FLEXERIL) 10 MG tablet Take 1 tablet (10 mg total) by mouth 2 (two) times daily.  30 tablet  1  . HYDROcodone-acetaminophen (NORCO/VICODIN) 5-325 MG per tablet Take 1 tablet by mouth every 4 (four) hours as needed. Pain  30 tablet  0  . Interferon Beta-1b (BETASERON) 0.3 MG KIT injection Inject 0.3 mg into the skin every other day.  10 kit  1  . lisinopril (PRINIVIL,ZESTRIL) 5 MG tablet Take 1 tablet (5 mg total) by mouth daily.  30 tablet  1  . loratadine (CLARITIN) 10 MG tablet Take 1 tablet (10 mg total) by mouth daily.  30 tablet  1  . LORazepam (ATIVAN) 2 MG tablet Take 1 tablet (2 mg total) by mouth every 4 (four) hours as needed for anxiety.  30 tablet  0  . meloxicam (MOBIC) 7.5 MG tablet Take 1 tablet (7.5 mg total) by mouth daily as needed (pain).  30 tablet  1  . metoprolol tartrate (LOPRESSOR) 25 MG tablet Take 1 tablet (25 mg total)  by mouth 2 (two) times daily.  60 tablet  1  . montelukast (SINGULAIR) 10 MG tablet Take 1 tablet (10 mg total) by mouth at bedtime.  30 tablet  1  . Multiple Vitamin (MULTIVITAMIN WITH MINERALS) TABS Take 1 tablet by mouth daily.  30 tablet  1  . simvastatin (ZOCOR) 40 MG tablet Take 1 tablet (40 mg total) by mouth at bedtime.  30 tablet  1  . traZODone (DESYREL) 100 MG tablet Take 200 mg by mouth at bedtime.      Marland Kitchen venlafaxine XR (EFFEXOR-XR) 150 MG 24 hr capsule Take 1 capsule (150 mg total) by mouth daily.  30 capsule  1  . senna-docusate (SENOKOT-S) 8.6-50 MG per tablet Take 1 tablet by mouth at bedtime as needed.  30 tablet  1    OB/GYN Status:  No LMP recorded. Patient is postmenopausal.  General Assessment Data Location of Assessment: [facility] (UNIT 300/room 330) ACT Assessment: Yes Living Arrangements: Other (Comment) (lives in family care hoome operated by family members) Can pt return to current living arrangement?: Yes Admission Status: Voluntary Is patient capable of signing voluntary admission?: No Transfer from: Acute Hospital Referral Source: Medical Floor Inpatient  Education Status Is patient currently in school?: No  Risk to self Suicidal Ideation: No (family member reported that the patient did call and stated ) Suicidal Intent: No Is patient at risk for suicide?: No Suicidal Plan?: No (relative reports that patient called her from the hospital a) Access to Means: No What has been your use of drugs/alcohol within the last 12 months?: no alcohol sincde 1990's Previous Attempts/Gestures: No How many times?: 0 Other Self Harm Risks: refuses medications and treatment at times Triggers for Past Attempts: None known Intentional Self Injurious Behavior: None Family Suicide History: Unknown Recent stressful life event(s): Conflict (Comment);Turmoil (Comment);Recent negative physical changes (upset by being ion home and havingf to pay for care) Persecutory  voices/beliefs?: No Depression: Yes Depression Symptoms: Loss of interest in usual pleasures;Feeling angry/irritable Substance abuse history and/or treatment for substance abuse?: Yes (now in remission) Suicide prevention information given to non-admitted patients: Yes  Risk to Others Homicidal Ideation: No Thoughts of Harm to Others: No Current Homicidal Intent: No Current Homicidal Plan: No Access to Homicidal Means: No History of harm to others?: No Assessment of Violence: None Noted Violent Behavior Description: patient is loud and angry at times  Does patient have access to weapons?: No Criminal Charges Pending?: No Does patient have a court date: No  Psychosis Hallucinations: None noted Delusions: None noted  Mental Status Report  Appear/Hygiene: Disheveled Eye Contact: Fair Motor Activity: Agitation;Freedom of movement;Restlessness;Unsteady Speech: Argumentative;Rapid;Loud Level of Consciousness: Alert;Restless;Irritable Mood: Depressed;Angry;Irritable Affect: Blunted;Irritable Anxiety Level: Minimal Thought Processes: Coherent Judgement: Impaired Orientation: Person;Place Obsessive Compulsive Thoughts/Behaviors: Moderate  Cognitive Functioning Concentration: Decreased Memory: Recent Impaired;Remote Impaired IQ: Average Insight: Poor Impulse Control: Poor Appetite: Fair Sleep: No Change Vegetative Symptoms: Decreased grooming  ADLScreening [facility] Assessment Services) Patient's cognitive ability adequate to safely complete daily activities?: No Patient able to express need for assistance with ADLs?: Yes Independently performs ADLs?: No  Abuse/Neglect [facility]) Physical Abuse: Denies Verbal Abuse: Denies Sexual Abuse: Denies  Prior Inpatient Therapy Prior Inpatient Therapy: No  Prior Outpatient Therapy Prior Outpatient Therapy: No  ADL Screening (condition at time of admission) Patient's cognitive ability adequate to safely complete daily activities?:  No Patient able to express need for assistance with ADLs?: Yes Independently performs ADLs?: No Weakness of Legs: Both Weakness of Arms/Hands: Both  Home Assistive Devices/Equipment Home Assistive Devices/Equipment: Bedside commode/3-in-1;Wheelchair  Therapy Consults (therapy consults require a physician order) PT Evaluation Needed: No OT Evalulation Needed: No SLP Evaluation Needed: No Abuse/Neglect Assessment (Assessment to be complete while patient is alone) Physical Abuse: Denies Verbal Abuse: Denies Sexual Abuse: Denies Exploitation of patient/patient's resources: Denies Self-Neglect: Denies Values / Beliefs Cultural Requests During Hospitalization: None Spiritual Requests During Hospitalization: None Consults Spiritual Care Consult Needed: No Social Work Consult Needed: No Merchant navy officer (For Healthcare) Advance Directive: Patient does not have advance directive;Patient would not like information Pre-existing out of facility DNR order (yellow form or pink MOST form): No Nutrition Screen- [facility] Patient's home diet: Regular Have you recently lost weight without trying?: No Have you been eating poorly because of a decreased appetite?: No Malnutrition Screening Tool Score: 0  Additional Information 1:1 In Past 12 Months?: No CIRT Risk: No Elopement Risk: No Does patient have medical clearance?: Yes     Disposition: Patient to remain on the medical floor until discharge. She be seen for and out patient evaluation by local mental health professional. She return to appropriate level of care upon discharge. CSW will assist with this. Disposition Initial Assessment Completed for this Encounter: Yes Disposition of Patient: Other dispositions (return to [facility] follow up with Dr Felecia Shelling) Other disposition(s): To current provider  On Site Evaluation by:   Reviewed with Physician:     Jearld Pies 08/27/2012 2:56 PM

## 2012-08-30 DIAGNOSIS — G35 Multiple sclerosis: Secondary | ICD-10-CM | POA: Diagnosis not present

## 2012-09-19 DIAGNOSIS — N390 Urinary tract infection, site not specified: Secondary | ICD-10-CM | POA: Diagnosis not present

## 2012-09-19 DIAGNOSIS — E7800 Pure hypercholesterolemia, unspecified: Secondary | ICD-10-CM | POA: Diagnosis not present

## 2012-09-19 DIAGNOSIS — I10 Essential (primary) hypertension: Secondary | ICD-10-CM | POA: Diagnosis not present

## 2012-09-19 DIAGNOSIS — I6789 Other cerebrovascular disease: Secondary | ICD-10-CM | POA: Diagnosis not present

## 2012-09-24 DIAGNOSIS — F39 Unspecified mood [affective] disorder: Secondary | ICD-10-CM | POA: Diagnosis not present

## 2012-09-26 DIAGNOSIS — G35 Multiple sclerosis: Secondary | ICD-10-CM | POA: Diagnosis not present

## 2012-09-26 DIAGNOSIS — R269 Unspecified abnormalities of gait and mobility: Secondary | ICD-10-CM | POA: Diagnosis not present

## 2012-09-26 DIAGNOSIS — M25519 Pain in unspecified shoulder: Secondary | ICD-10-CM | POA: Diagnosis not present

## 2012-09-26 DIAGNOSIS — R569 Unspecified convulsions: Secondary | ICD-10-CM | POA: Diagnosis not present

## 2012-10-17 DIAGNOSIS — F39 Unspecified mood [affective] disorder: Secondary | ICD-10-CM | POA: Diagnosis not present

## 2012-11-06 DIAGNOSIS — R269 Unspecified abnormalities of gait and mobility: Secondary | ICD-10-CM | POA: Diagnosis not present

## 2012-11-11 DIAGNOSIS — I10 Essential (primary) hypertension: Secondary | ICD-10-CM | POA: Diagnosis not present

## 2012-11-11 DIAGNOSIS — Z87440 Personal history of urinary (tract) infections: Secondary | ICD-10-CM | POA: Diagnosis not present

## 2012-11-11 DIAGNOSIS — R279 Unspecified lack of coordination: Secondary | ICD-10-CM | POA: Diagnosis not present

## 2012-11-11 DIAGNOSIS — G35 Multiple sclerosis: Secondary | ICD-10-CM | POA: Diagnosis not present

## 2012-11-11 DIAGNOSIS — R269 Unspecified abnormalities of gait and mobility: Secondary | ICD-10-CM | POA: Diagnosis not present

## 2012-11-13 ENCOUNTER — Ambulatory Visit: Payer: Self-pay | Admitting: Nurse Practitioner

## 2012-11-15 DIAGNOSIS — I10 Essential (primary) hypertension: Secondary | ICD-10-CM | POA: Diagnosis not present

## 2012-11-15 DIAGNOSIS — R269 Unspecified abnormalities of gait and mobility: Secondary | ICD-10-CM | POA: Diagnosis not present

## 2012-11-15 DIAGNOSIS — G35 Multiple sclerosis: Secondary | ICD-10-CM | POA: Diagnosis not present

## 2012-11-15 DIAGNOSIS — Z87440 Personal history of urinary (tract) infections: Secondary | ICD-10-CM | POA: Diagnosis not present

## 2012-11-15 DIAGNOSIS — R279 Unspecified lack of coordination: Secondary | ICD-10-CM | POA: Diagnosis not present

## 2012-11-18 DIAGNOSIS — R269 Unspecified abnormalities of gait and mobility: Secondary | ICD-10-CM | POA: Diagnosis not present

## 2012-11-18 DIAGNOSIS — R279 Unspecified lack of coordination: Secondary | ICD-10-CM | POA: Diagnosis not present

## 2012-11-18 DIAGNOSIS — I10 Essential (primary) hypertension: Secondary | ICD-10-CM | POA: Diagnosis not present

## 2012-11-18 DIAGNOSIS — G35 Multiple sclerosis: Secondary | ICD-10-CM | POA: Diagnosis not present

## 2012-11-18 DIAGNOSIS — Z87440 Personal history of urinary (tract) infections: Secondary | ICD-10-CM | POA: Diagnosis not present

## 2012-11-19 DIAGNOSIS — I10 Essential (primary) hypertension: Secondary | ICD-10-CM | POA: Diagnosis not present

## 2012-11-19 DIAGNOSIS — R279 Unspecified lack of coordination: Secondary | ICD-10-CM | POA: Diagnosis not present

## 2012-11-19 DIAGNOSIS — Z87440 Personal history of urinary (tract) infections: Secondary | ICD-10-CM | POA: Diagnosis not present

## 2012-11-19 DIAGNOSIS — R269 Unspecified abnormalities of gait and mobility: Secondary | ICD-10-CM | POA: Diagnosis not present

## 2012-11-19 DIAGNOSIS — G35 Multiple sclerosis: Secondary | ICD-10-CM | POA: Diagnosis not present

## 2012-11-21 DIAGNOSIS — Z87440 Personal history of urinary (tract) infections: Secondary | ICD-10-CM | POA: Diagnosis not present

## 2012-11-21 DIAGNOSIS — I10 Essential (primary) hypertension: Secondary | ICD-10-CM | POA: Diagnosis not present

## 2012-11-21 DIAGNOSIS — R279 Unspecified lack of coordination: Secondary | ICD-10-CM | POA: Diagnosis not present

## 2012-11-21 DIAGNOSIS — R269 Unspecified abnormalities of gait and mobility: Secondary | ICD-10-CM | POA: Diagnosis not present

## 2012-11-21 DIAGNOSIS — G35 Multiple sclerosis: Secondary | ICD-10-CM | POA: Diagnosis not present

## 2012-11-25 DIAGNOSIS — R279 Unspecified lack of coordination: Secondary | ICD-10-CM | POA: Diagnosis not present

## 2012-11-25 DIAGNOSIS — I10 Essential (primary) hypertension: Secondary | ICD-10-CM | POA: Diagnosis not present

## 2012-11-25 DIAGNOSIS — R269 Unspecified abnormalities of gait and mobility: Secondary | ICD-10-CM | POA: Diagnosis not present

## 2012-11-25 DIAGNOSIS — G35 Multiple sclerosis: Secondary | ICD-10-CM | POA: Diagnosis not present

## 2012-11-25 DIAGNOSIS — Z79899 Other long term (current) drug therapy: Secondary | ICD-10-CM | POA: Diagnosis not present

## 2012-11-25 DIAGNOSIS — R569 Unspecified convulsions: Secondary | ICD-10-CM | POA: Diagnosis not present

## 2012-11-25 DIAGNOSIS — Z87440 Personal history of urinary (tract) infections: Secondary | ICD-10-CM | POA: Diagnosis not present

## 2012-11-26 DIAGNOSIS — R279 Unspecified lack of coordination: Secondary | ICD-10-CM | POA: Diagnosis not present

## 2012-11-26 DIAGNOSIS — G35 Multiple sclerosis: Secondary | ICD-10-CM | POA: Diagnosis not present

## 2012-11-26 DIAGNOSIS — Z87440 Personal history of urinary (tract) infections: Secondary | ICD-10-CM | POA: Diagnosis not present

## 2012-11-26 DIAGNOSIS — R269 Unspecified abnormalities of gait and mobility: Secondary | ICD-10-CM | POA: Diagnosis not present

## 2012-11-26 DIAGNOSIS — I10 Essential (primary) hypertension: Secondary | ICD-10-CM | POA: Diagnosis not present

## 2012-11-28 DIAGNOSIS — R269 Unspecified abnormalities of gait and mobility: Secondary | ICD-10-CM | POA: Diagnosis not present

## 2012-11-28 DIAGNOSIS — G35 Multiple sclerosis: Secondary | ICD-10-CM | POA: Diagnosis not present

## 2012-11-28 DIAGNOSIS — Z87440 Personal history of urinary (tract) infections: Secondary | ICD-10-CM | POA: Diagnosis not present

## 2012-11-28 DIAGNOSIS — R279 Unspecified lack of coordination: Secondary | ICD-10-CM | POA: Diagnosis not present

## 2012-11-28 DIAGNOSIS — I10 Essential (primary) hypertension: Secondary | ICD-10-CM | POA: Diagnosis not present

## 2012-11-29 DIAGNOSIS — R279 Unspecified lack of coordination: Secondary | ICD-10-CM | POA: Diagnosis not present

## 2012-11-29 DIAGNOSIS — R269 Unspecified abnormalities of gait and mobility: Secondary | ICD-10-CM | POA: Diagnosis not present

## 2012-11-29 DIAGNOSIS — G35 Multiple sclerosis: Secondary | ICD-10-CM | POA: Diagnosis not present

## 2012-11-29 DIAGNOSIS — Z87440 Personal history of urinary (tract) infections: Secondary | ICD-10-CM | POA: Diagnosis not present

## 2012-11-29 DIAGNOSIS — I10 Essential (primary) hypertension: Secondary | ICD-10-CM | POA: Diagnosis not present

## 2012-12-02 DIAGNOSIS — I10 Essential (primary) hypertension: Secondary | ICD-10-CM | POA: Diagnosis not present

## 2012-12-02 DIAGNOSIS — Z87440 Personal history of urinary (tract) infections: Secondary | ICD-10-CM | POA: Diagnosis not present

## 2012-12-02 DIAGNOSIS — G35 Multiple sclerosis: Secondary | ICD-10-CM | POA: Diagnosis not present

## 2012-12-02 DIAGNOSIS — R279 Unspecified lack of coordination: Secondary | ICD-10-CM | POA: Diagnosis not present

## 2012-12-02 DIAGNOSIS — R269 Unspecified abnormalities of gait and mobility: Secondary | ICD-10-CM | POA: Diagnosis not present

## 2012-12-03 DIAGNOSIS — G35 Multiple sclerosis: Secondary | ICD-10-CM | POA: Diagnosis not present

## 2012-12-03 DIAGNOSIS — R279 Unspecified lack of coordination: Secondary | ICD-10-CM | POA: Diagnosis not present

## 2012-12-03 DIAGNOSIS — R269 Unspecified abnormalities of gait and mobility: Secondary | ICD-10-CM | POA: Diagnosis not present

## 2012-12-03 DIAGNOSIS — I10 Essential (primary) hypertension: Secondary | ICD-10-CM | POA: Diagnosis not present

## 2012-12-03 DIAGNOSIS — Z87440 Personal history of urinary (tract) infections: Secondary | ICD-10-CM | POA: Diagnosis not present

## 2012-12-04 DIAGNOSIS — R269 Unspecified abnormalities of gait and mobility: Secondary | ICD-10-CM | POA: Diagnosis not present

## 2012-12-04 DIAGNOSIS — R279 Unspecified lack of coordination: Secondary | ICD-10-CM | POA: Diagnosis not present

## 2012-12-04 DIAGNOSIS — I10 Essential (primary) hypertension: Secondary | ICD-10-CM | POA: Diagnosis not present

## 2012-12-04 DIAGNOSIS — Z87440 Personal history of urinary (tract) infections: Secondary | ICD-10-CM | POA: Diagnosis not present

## 2012-12-04 DIAGNOSIS — G35 Multiple sclerosis: Secondary | ICD-10-CM | POA: Diagnosis not present

## 2012-12-05 DIAGNOSIS — I10 Essential (primary) hypertension: Secondary | ICD-10-CM | POA: Diagnosis not present

## 2012-12-05 DIAGNOSIS — G35 Multiple sclerosis: Secondary | ICD-10-CM | POA: Diagnosis not present

## 2012-12-05 DIAGNOSIS — R279 Unspecified lack of coordination: Secondary | ICD-10-CM | POA: Diagnosis not present

## 2012-12-05 DIAGNOSIS — Z87440 Personal history of urinary (tract) infections: Secondary | ICD-10-CM | POA: Diagnosis not present

## 2012-12-05 DIAGNOSIS — R269 Unspecified abnormalities of gait and mobility: Secondary | ICD-10-CM | POA: Diagnosis not present

## 2012-12-09 DIAGNOSIS — G35 Multiple sclerosis: Secondary | ICD-10-CM | POA: Diagnosis not present

## 2012-12-09 DIAGNOSIS — I6789 Other cerebrovascular disease: Secondary | ICD-10-CM | POA: Diagnosis not present

## 2012-12-09 DIAGNOSIS — R569 Unspecified convulsions: Secondary | ICD-10-CM | POA: Diagnosis not present

## 2012-12-10 DIAGNOSIS — I10 Essential (primary) hypertension: Secondary | ICD-10-CM | POA: Diagnosis not present

## 2012-12-10 DIAGNOSIS — R269 Unspecified abnormalities of gait and mobility: Secondary | ICD-10-CM | POA: Diagnosis not present

## 2012-12-10 DIAGNOSIS — R279 Unspecified lack of coordination: Secondary | ICD-10-CM | POA: Diagnosis not present

## 2012-12-10 DIAGNOSIS — Z87440 Personal history of urinary (tract) infections: Secondary | ICD-10-CM | POA: Diagnosis not present

## 2012-12-10 DIAGNOSIS — G35 Multiple sclerosis: Secondary | ICD-10-CM | POA: Diagnosis not present

## 2012-12-12 DIAGNOSIS — I10 Essential (primary) hypertension: Secondary | ICD-10-CM | POA: Diagnosis not present

## 2012-12-12 DIAGNOSIS — G35 Multiple sclerosis: Secondary | ICD-10-CM | POA: Diagnosis not present

## 2012-12-12 DIAGNOSIS — Z87440 Personal history of urinary (tract) infections: Secondary | ICD-10-CM | POA: Diagnosis not present

## 2012-12-12 DIAGNOSIS — R279 Unspecified lack of coordination: Secondary | ICD-10-CM | POA: Diagnosis not present

## 2012-12-12 DIAGNOSIS — R269 Unspecified abnormalities of gait and mobility: Secondary | ICD-10-CM | POA: Diagnosis not present

## 2012-12-13 DIAGNOSIS — R269 Unspecified abnormalities of gait and mobility: Secondary | ICD-10-CM | POA: Diagnosis not present

## 2012-12-13 DIAGNOSIS — I10 Essential (primary) hypertension: Secondary | ICD-10-CM | POA: Diagnosis not present

## 2012-12-13 DIAGNOSIS — G35 Multiple sclerosis: Secondary | ICD-10-CM | POA: Diagnosis not present

## 2012-12-13 DIAGNOSIS — R279 Unspecified lack of coordination: Secondary | ICD-10-CM | POA: Diagnosis not present

## 2012-12-13 DIAGNOSIS — Z87440 Personal history of urinary (tract) infections: Secondary | ICD-10-CM | POA: Diagnosis not present

## 2012-12-16 DIAGNOSIS — R269 Unspecified abnormalities of gait and mobility: Secondary | ICD-10-CM | POA: Diagnosis not present

## 2012-12-16 DIAGNOSIS — Z87440 Personal history of urinary (tract) infections: Secondary | ICD-10-CM | POA: Diagnosis not present

## 2012-12-16 DIAGNOSIS — I10 Essential (primary) hypertension: Secondary | ICD-10-CM | POA: Diagnosis not present

## 2012-12-16 DIAGNOSIS — G35 Multiple sclerosis: Secondary | ICD-10-CM | POA: Diagnosis not present

## 2012-12-16 DIAGNOSIS — R279 Unspecified lack of coordination: Secondary | ICD-10-CM | POA: Diagnosis not present

## 2012-12-17 DIAGNOSIS — G35 Multiple sclerosis: Secondary | ICD-10-CM | POA: Diagnosis not present

## 2012-12-17 DIAGNOSIS — R269 Unspecified abnormalities of gait and mobility: Secondary | ICD-10-CM | POA: Diagnosis not present

## 2012-12-17 DIAGNOSIS — I10 Essential (primary) hypertension: Secondary | ICD-10-CM | POA: Diagnosis not present

## 2012-12-17 DIAGNOSIS — Z87440 Personal history of urinary (tract) infections: Secondary | ICD-10-CM | POA: Diagnosis not present

## 2012-12-17 DIAGNOSIS — R279 Unspecified lack of coordination: Secondary | ICD-10-CM | POA: Diagnosis not present

## 2012-12-18 DIAGNOSIS — G35 Multiple sclerosis: Secondary | ICD-10-CM | POA: Diagnosis not present

## 2012-12-18 DIAGNOSIS — I10 Essential (primary) hypertension: Secondary | ICD-10-CM | POA: Diagnosis not present

## 2012-12-18 DIAGNOSIS — R269 Unspecified abnormalities of gait and mobility: Secondary | ICD-10-CM | POA: Diagnosis not present

## 2012-12-18 DIAGNOSIS — Z87440 Personal history of urinary (tract) infections: Secondary | ICD-10-CM | POA: Diagnosis not present

## 2012-12-18 DIAGNOSIS — R279 Unspecified lack of coordination: Secondary | ICD-10-CM | POA: Diagnosis not present

## 2012-12-19 DIAGNOSIS — R269 Unspecified abnormalities of gait and mobility: Secondary | ICD-10-CM | POA: Diagnosis not present

## 2012-12-19 DIAGNOSIS — Z87440 Personal history of urinary (tract) infections: Secondary | ICD-10-CM | POA: Diagnosis not present

## 2012-12-19 DIAGNOSIS — I10 Essential (primary) hypertension: Secondary | ICD-10-CM | POA: Diagnosis not present

## 2012-12-19 DIAGNOSIS — G35 Multiple sclerosis: Secondary | ICD-10-CM | POA: Diagnosis not present

## 2012-12-19 DIAGNOSIS — R279 Unspecified lack of coordination: Secondary | ICD-10-CM | POA: Diagnosis not present

## 2012-12-23 DIAGNOSIS — I10 Essential (primary) hypertension: Secondary | ICD-10-CM | POA: Diagnosis not present

## 2012-12-23 DIAGNOSIS — Z87440 Personal history of urinary (tract) infections: Secondary | ICD-10-CM | POA: Diagnosis not present

## 2012-12-23 DIAGNOSIS — R279 Unspecified lack of coordination: Secondary | ICD-10-CM | POA: Diagnosis not present

## 2012-12-23 DIAGNOSIS — R269 Unspecified abnormalities of gait and mobility: Secondary | ICD-10-CM | POA: Diagnosis not present

## 2012-12-23 DIAGNOSIS — G35 Multiple sclerosis: Secondary | ICD-10-CM | POA: Diagnosis not present

## 2012-12-25 DIAGNOSIS — G35 Multiple sclerosis: Secondary | ICD-10-CM | POA: Diagnosis not present

## 2012-12-25 DIAGNOSIS — R279 Unspecified lack of coordination: Secondary | ICD-10-CM | POA: Diagnosis not present

## 2012-12-25 DIAGNOSIS — R269 Unspecified abnormalities of gait and mobility: Secondary | ICD-10-CM | POA: Diagnosis not present

## 2012-12-25 DIAGNOSIS — Z87440 Personal history of urinary (tract) infections: Secondary | ICD-10-CM | POA: Diagnosis not present

## 2012-12-25 DIAGNOSIS — I10 Essential (primary) hypertension: Secondary | ICD-10-CM | POA: Diagnosis not present

## 2012-12-26 ENCOUNTER — Emergency Department (HOSPITAL_COMMUNITY): Payer: Medicare Other

## 2012-12-26 ENCOUNTER — Emergency Department (HOSPITAL_COMMUNITY)
Admission: EM | Admit: 2012-12-26 | Discharge: 2012-12-26 | Disposition: A | Discharge status: Home / Self Care | Payer: Medicare Other | Attending: Emergency Medicine | Admitting: Emergency Medicine

## 2012-12-26 ENCOUNTER — Other Ambulatory Visit: Payer: Self-pay

## 2012-12-26 ENCOUNTER — Encounter (HOSPITAL_COMMUNITY): Payer: Self-pay | Admitting: Emergency Medicine

## 2012-12-26 DIAGNOSIS — F29 Unspecified psychosis not due to a substance or known physiological condition: Secondary | ICD-10-CM | POA: Diagnosis not present

## 2012-12-26 DIAGNOSIS — Z860100 Personal history of colon polyps, unspecified: Secondary | ICD-10-CM | POA: Insufficient documentation

## 2012-12-26 DIAGNOSIS — F4489 Other dissociative and conversion disorders: Secondary | ICD-10-CM | POA: Diagnosis not present

## 2012-12-26 DIAGNOSIS — F17200 Nicotine dependence, unspecified, uncomplicated: Secondary | ICD-10-CM | POA: Diagnosis not present

## 2012-12-26 DIAGNOSIS — R4182 Altered mental status, unspecified: Secondary | ICD-10-CM | POA: Diagnosis not present

## 2012-12-26 DIAGNOSIS — Z8719 Personal history of other diseases of the digestive system: Secondary | ICD-10-CM | POA: Insufficient documentation

## 2012-12-26 DIAGNOSIS — R279 Unspecified lack of coordination: Secondary | ICD-10-CM | POA: Diagnosis not present

## 2012-12-26 DIAGNOSIS — Z8619 Personal history of other infectious and parasitic diseases: Secondary | ICD-10-CM | POA: Insufficient documentation

## 2012-12-26 DIAGNOSIS — Z8669 Personal history of other diseases of the nervous system and sense organs: Secondary | ICD-10-CM | POA: Insufficient documentation

## 2012-12-26 DIAGNOSIS — J9819 Other pulmonary collapse: Secondary | ICD-10-CM | POA: Diagnosis not present

## 2012-12-26 DIAGNOSIS — R5381 Other malaise: Secondary | ICD-10-CM | POA: Insufficient documentation

## 2012-12-26 DIAGNOSIS — R6889 Other general symptoms and signs: Secondary | ICD-10-CM | POA: Diagnosis not present

## 2012-12-26 DIAGNOSIS — R51 Headache: Secondary | ICD-10-CM | POA: Diagnosis not present

## 2012-12-26 DIAGNOSIS — I6789 Other cerebrovascular disease: Secondary | ICD-10-CM | POA: Diagnosis not present

## 2012-12-26 LAB — CBC WITH DIFFERENTIAL/PLATELET
Basophils Absolute: 0 10*3/uL (ref 0.0–0.1)
Basophils Relative: 0 % (ref 0–1)
Hemoglobin: 14.1 g/dL (ref 12.0–15.0)
Lymphocytes Relative: 45 % (ref 12–46)
MCHC: 36.2 g/dL — ABNORMAL HIGH (ref 30.0–36.0)
Monocytes Relative: 8 % (ref 3–12)
Neutro Abs: 3.3 10*3/uL (ref 1.7–7.7)
Neutrophils Relative %: 45 % (ref 43–77)
RDW: 14.3 % (ref 11.5–15.5)
WBC: 7.3 10*3/uL (ref 4.0–10.5)

## 2012-12-26 LAB — URINALYSIS, ROUTINE W REFLEX MICROSCOPIC
Bilirubin Urine: NEGATIVE
Glucose, UA: NEGATIVE mg/dL
Ketones, ur: NEGATIVE mg/dL
pH: 7 (ref 5.0–8.0)

## 2012-12-26 LAB — COMPREHENSIVE METABOLIC PANEL
AST: 12 U/L (ref 0–37)
Albumin: 3.5 g/dL (ref 3.5–5.2)
Alkaline Phosphatase: 57 U/L (ref 39–117)
CO2: 31 mEq/L (ref 19–32)
Chloride: 104 mEq/L (ref 96–112)
Potassium: 3.5 mEq/L (ref 3.5–5.1)
Total Bilirubin: 0.2 mg/dL — ABNORMAL LOW (ref 0.3–1.2)

## 2012-12-26 LAB — TROPONIN I: Troponin I: <0.3 ng/mL (ref <0.30)

## 2012-12-26 MED ORDER — SODIUM CHLORIDE 0.9 % IV SOLN
1000.0000 mg | Freq: Once | INTRAVENOUS | Status: AC
  Administered 2012-12-26: 1000 mg via INTRAVENOUS
  Filled 2012-12-26: qty 10

## 2012-12-26 MED ORDER — NALOXONE HCL 1 MG/ML IJ SOLN
1.0000 mg | Freq: Once | INTRAMUSCULAR | Status: AC
  Administered 2012-12-26: 1 mg via INTRAVENOUS
  Filled 2012-12-26: qty 2

## 2012-12-26 NOTE — ED Provider Notes (Signed)
History  This chart was scribed for Benny Lennert, MD by Bennett Scrape, ED Scribe. This patient was seen in room APA05/APA05 and the patient's care was started at 7:46 AM.  CSN: [id number] Arrival date & time 12/26/12  7829  First MD Initiated Contact with Patient 12/26/12 [phone]     Chief Complaint  Patient presents with  . Altered Mental Status    Level 5 Caveat-AMS  The history is provided by the patient and the EMS personnel. No language interpreter was used.    HPI Comments: Tami Nolan is a 52 y.o. female brought in by ambulance from [facility], who presents to the Emergency Department complaining of a possible CVA. Pt called EMS due to concern for CVA. Pt reports h/o prior CVA; however, [facility] has no records of this. At baseline, pt states that she doesn't ambulate. EMS reported upon their arrival pt was tying her shoes. Pt is unable to answer any further questions.    Past Medical History  Diagnosis Date  . Hepatitis     ETOH related   . ETOH abuse   . Hx of abnormal cervical Pap smear 1997  . Colon polyps 1997  . Genital warts   . Multiple sclerosis    Past Surgical History  Procedure Laterality Date  . Bilatetral tubal ligation     Family History  Problem Relation Age of Onset  . Coronary artery disease Brother   . Diabetes Brother   . Hypertension Brother   . Hypertension Mother   . Diabetes Mother   . Hypertension Sister   . Alcohol abuse      family history    History  Substance Use Topics  . Smoking status: Current Every Day Smoker -- 1.00 packs/day for 15 years    Types: Cigarettes  . Smokeless tobacco: Current User  . Alcohol Use: No     Comment: Hx of ETOH abuse - dry since 1997.   No OB history provided.   Review of Systems  Unable to perform ROS: Mental status change    Allergies  Review of patient's allergies indicates no known allergies.  Home Medications   Current Outpatient Rx  Name  Route  Sig  Dispense  Refill  . aspirin  325 MG tablet   Oral   Take 1 tablet (325 mg total) by mouth daily.   30 tablet   1   . cyclobenzaprine (FLEXERIL) 10 MG tablet   Oral   Take 1 tablet (10 mg total) by mouth 2 (two) times daily.   30 tablet   1   . HYDROcodone-acetaminophen (NORCO/VICODIN) 5-325 MG per tablet   Oral   Take 1 tablet by mouth every 4 (four) hours as needed. Pain   30 tablet   0   . Interferon Beta-1b (BETASERON) 0.3 MG KIT injection   Subcutaneous   Inject 0.3 mg into the skin every other day.   10 kit   1   . levETIRAcetam (KEPPRA) 500 MG tablet   Oral   Take 1 tablet (500 mg total) by mouth 2 (two) times daily.   60 tablet   1   . lisinopril (PRINIVIL,ZESTRIL) 5 MG tablet   Oral   Take 1 tablet (5 mg total) by mouth daily.   30 tablet   1     Hold for SBP<100   . loratadine (CLARITIN) 10 MG tablet   Oral   Take 1 tablet (10 mg total) by mouth daily.   30  tablet   1   . LORazepam (ATIVAN) 2 MG tablet   Oral   Take 1 tablet (2 mg total) by mouth every 4 (four) hours as needed for anxiety.   30 tablet   0   . meloxicam (MOBIC) 7.5 MG tablet   Oral   Take 1 tablet (7.5 mg total) by mouth daily as needed (pain).   30 tablet   1   . metoprolol tartrate (LOPRESSOR) 25 MG tablet   Oral   Take 1 tablet (25 mg total) by mouth 2 (two) times daily.   60 tablet   1   . montelukast (SINGULAIR) 10 MG tablet   Oral   Take 1 tablet (10 mg total) by mouth at bedtime.   30 tablet   1   . Multiple Vitamin (MULTIVITAMIN WITH MINERALS) TABS   Oral   Take 1 tablet by mouth daily.   30 tablet   1   . senna-docusate (SENOKOT-S) 8.6-50 MG per tablet   Oral   Take 1 tablet by mouth at bedtime as needed.   30 tablet   1   . simvastatin (ZOCOR) 40 MG tablet   Oral   Take 1 tablet (40 mg total) by mouth at bedtime.   30 tablet   1   . traZODone (DESYREL) 100 MG tablet   Oral   Take 200 mg by mouth at bedtime.         Marland Kitchen venlafaxine XR (EFFEXOR-XR) 150 MG 24 hr capsule    Oral   Take 1 capsule (150 mg total) by mouth daily.   30 capsule   1    Triage Vitals: BP 157/111  Pulse 92  Resp 13  Wt 200 lb (90.719 kg)  BMI 31.32 kg/m2  SpO2 100%  Physical Exam  Nursing note and vitals reviewed. Constitutional: She appears well-developed and well-nourished. She appears lethargic.  HENT:  Head: Normocephalic and atraumatic.  Eyes: Conjunctivae and EOM are normal. No scleral icterus.  Neck: Neck supple. No thyromegaly present.  Cardiovascular: Normal rate and regular rhythm.  Exam reveals no gallop and no friction rub.   No murmur heard. Pulmonary/Chest: No stridor. She has no wheezes. She has no rales. She exhibits no tenderness.  Abdominal: She exhibits no distension. There is no tenderness. There is no rebound.  Musculoskeletal: Normal range of motion. She exhibits no edema.  Lymphadenopathy:    She has no cervical adenopathy.  Neurological: She appears lethargic. Coordination normal.  Profound weakness in the LUE and bilateral lower extremities, good strength in the RUE but it is contracted  Skin: No rash noted. No erythema.    ED Course  Procedures (including critical care time)  Medications  naloxone (NARCAN) injection 1 mg (1 mg Intravenous Given 12/26/12 0800)   DIAGNOSTIC STUDIES: Oxygen Saturation is 100% on room air, normal by my interpretation.    COORDINATION OF CARE: 7:55 AM-Per pt's ED nurse, [facility] reports that the pt complained to a tech this morning of right-sided HA. Tech stated that the pt appeared more depressed than normal but was unable to tell if slurred speech was new or from the pt missing her dentures. They confirm pt's claim of a prior CVA.  7:58 AM-ED nurse who has taken care of pt before states that the pt's speech is at baseline for her currently but reports that the pt appears more lethargic than normal.  10:28 AM-Pt rechecked and is being helped back into bed by ED staff. She feels  improved with medications listed  above.She appears more awake and is answering questions appropriately. [facility] updated records show a h/o CVA, MS and seizures. Pt reports that ambulates to the bathroom without assistance. Will order MRI of brain and pt is agreeable to this.  12:00 PM-Pt rechecked and is more alert and answering questions appropriately. She states that she has been taking her medications for seizures as prescribed. Informed pt of negative MRI for CVA or TIA. When asked if she could have had a seizure, pt states "I don't know what happened". Pt states she is ready to go to [facility].  Labs Reviewed  CBC WITH DIFFERENTIAL - Abnormal; Notable for the following:    MCHC 36.2 (*)    All other components within normal limits  COMPREHENSIVE METABOLIC PANEL - Abnormal; Notable for the following:    Glucose, Bld 118 (*)    Creatinine, Ser 0.47 (*)    Total Bilirubin 0.2 (*)    All other components within normal limits  TROPONIN I - Abnormal; Notable for the following:    Troponin I 0.32 (*)    All other components within normal limits  URINALYSIS, ROUTINE W REFLEX MICROSCOPIC - Abnormal; Notable for the following:    Hgb urine dipstick TRACE (*)    All other components within normal limits  URINE MICROSCOPIC-ADD ON - Abnormal; Notable for the following:    Squamous Epithelial / LPF FEW (*)    All other components within normal limits   Dg Chest 1 View  12/26/2012   *RADIOLOGY REPORT*  Clinical Data: Acute mental status changes.  CHEST - 1 VIEW  Comparison: 04/05/2012  Findings: Lung volumes are low bilaterally with bibasilar atelectasis present.  There is no evidence of pulmonary edema, focal airspace consolidation or pleural fluid.  Heart size is within normal limits.  Thoracolumbar spinal fusion rods again noted.  IMPRESSION: Low lung volumes with bibasilar atelectasis.   Original Report Authenticated By: Irish Lack, M.D.   Ct Head Wo Contrast  12/26/2012   *RADIOLOGY REPORT*  Clinical Data: Altered mental  status, hepatitis, confusion  CT HEAD WITHOUT CONTRAST  Technique:  Contiguous axial images were obtained from the base of the skull through the vertex without contrast.  Comparison: 08/23/2012  Findings: Extensive brain atrophy and microvascular ischemic changes throughout the cerebral white matter.  Mild associated ventricular enlargement.  No acute intracranial hemorrhage, definite new infarction, mass lesion, midline shift, herniation, hydrocephalus, or extra-axial fluid collection.  Cerebellar atrophy as well.  Symmetric orbits.  Mastoids and sinuses are clear except for a right inferior maxillary sinus retention cyst or polyp.  This is unchanged.  IMPRESSION: Stable atrophy and microvascular ischemic changes.  No interval change or acute process by noncontrast CT.   Original Report Authenticated By: Judie Petit. Miles Costain, M.D.   Mr Brain Wo Contrast  12/26/2012   *RADIOLOGY REPORT*  Clinical Data: Right-sided headache with altered mental status. History of hepatitis, alcohol abuse and on multiple sclerosis.  MRI HEAD WITHOUT CONTRAST  Technique:  Multiplanar, multiecho pulse sequences of the brain and surrounding structures were obtained according to standard protocol without intravenous contrast.  Comparison: 12/26/2012 CT.  04/05/2012 MR.  Findings: Motion degraded exam.  On diffusion sequence, slight increased signal within the right paracentral pons does not follow a typical vascular distribution and has an appearance more suggestive of artifact rather than acute infarct.  Overall, no acute infarct is noted.  Prominent confluent white matter type changes periventricular and subcortical region as well as involving the  pons.  Findings without significant change and may reflect result of multiple sclerosis. Small vessel disease type changes contributing to this appearance not entirely excluded.  No local mass effect to suggest underlying lesion as can be seen with treatment of multiple sclerosis (PML).  Global atrophy.  Ventricular prominence felt to be related to atrophy rather hydrocephalus.  No intracranial mass lesion detected on this unenhanced exam.  No intracranial hemorrhage.  Left vertebral artery is markedly attenuated. This is noted on prior MR angiogram (04/06/2012).  Atherosclerotic type changes better delineated on prior examination.  Top normal sized pituitary gland unchanged.  Mild exophthalmos.  Minimal paranasal sinus mucosal thickening.  IMPRESSION: Motion degraded exam.  No acute infarct.  Prominent confluent white matter type changes periventricular and subcortical region as well as involving the pons.  Findings without significant change and may reflect result of multiple sclerosis. Small vessel disease type changes contributing to this appearance not entirely excluded.  Global atrophy. Ventricular prominence felt to be related to atrophy rather hydrocephalus.  No intracranial mass lesion detected on this unenhanced motion degraded exam.  No intracranial hemorrhage.  Left vertebral artery is markedly attenuated. This is noted on prior MR angiogram (04/06/2012).  Atherosclerotic type changes better delineated on prior examination.   Original Report Authenticated By: Lacy Duverney, M.D.    No diagnosis found.  MDM  Pt back to base line at discharge.  Possible sz earlier.  Pt to follow up with her md  The chart was scribed for me under my direct supervision.  I personally performed the history, physical, and medical decision making and all procedures in the evaluation of this patient.Benny Lennert, MD 12/26/12 [phone]

## 2012-12-26 NOTE — ED Notes (Signed)
Patient trying to get up off end of bed.  While trying to get her back in bed, had focal seizure with head turned to right, eyes moving right to left and non-responsive.  Lasted 30-45 seconds with return of responsiveness immediately following. Repositioned in bed and notified Dr. Estell Harpin w/receipt of orders.

## 2012-12-26 NOTE — ED Notes (Signed)
EMS was called to [facility] rest home for possible stroke.  Pt appears very lethargic and unable to follow commands, slurred speech present.

## 2012-12-26 NOTE — ED Notes (Signed)
The patient is now awake enough to follow commands.

## 2012-12-26 NOTE — ED Notes (Signed)
The patient is sitting up in stretcher, states that she is going to the bathroom and she is able to do it on her own, sliding toward end of bed, the patient does not have strength enough to stand, attempted to explain this to the patient who disagrees and attempts to get up on her own, refuses bedpan, refuses catheterization.  Assist x4 at bedside to attempt bedside commode usage at patient request, the patient is unable to stand with assist x4.  States for me to leave the room, charge nurse at bedside.  The patient assigned a Recruitment consultant.

## 2012-12-26 NOTE — ED Notes (Signed)
Critical Troponin 0.32.  Notified edp

## 2012-12-26 NOTE — ED Notes (Signed)
Spoke with [facility], per staff "the patient woke up this morning complaining of a right sided headache and thought she was having another stroke, so she called 911."  Staff at [facility] state "the patient appeared to be quieter and more depressed than usual."  Staff provided no insight to normal level of consciousness, her normal speech or activity level.

## 2012-12-30 DIAGNOSIS — G35 Multiple sclerosis: Secondary | ICD-10-CM | POA: Diagnosis not present

## 2012-12-30 DIAGNOSIS — R279 Unspecified lack of coordination: Secondary | ICD-10-CM | POA: Diagnosis not present

## 2012-12-30 DIAGNOSIS — I10 Essential (primary) hypertension: Secondary | ICD-10-CM | POA: Diagnosis not present

## 2012-12-30 DIAGNOSIS — Z87440 Personal history of urinary (tract) infections: Secondary | ICD-10-CM | POA: Diagnosis not present

## 2012-12-30 DIAGNOSIS — R269 Unspecified abnormalities of gait and mobility: Secondary | ICD-10-CM | POA: Diagnosis not present

## 2013-01-01 DIAGNOSIS — R279 Unspecified lack of coordination: Secondary | ICD-10-CM | POA: Diagnosis not present

## 2013-01-01 DIAGNOSIS — R269 Unspecified abnormalities of gait and mobility: Secondary | ICD-10-CM | POA: Diagnosis not present

## 2013-01-01 DIAGNOSIS — Z87440 Personal history of urinary (tract) infections: Secondary | ICD-10-CM | POA: Diagnosis not present

## 2013-01-01 DIAGNOSIS — G35 Multiple sclerosis: Secondary | ICD-10-CM | POA: Diagnosis not present

## 2013-01-01 DIAGNOSIS — I10 Essential (primary) hypertension: Secondary | ICD-10-CM | POA: Diagnosis not present

## 2013-01-06 DIAGNOSIS — F329 Major depressive disorder, single episode, unspecified: Secondary | ICD-10-CM | POA: Diagnosis not present

## 2013-01-08 DIAGNOSIS — G35 Multiple sclerosis: Secondary | ICD-10-CM | POA: Diagnosis not present

## 2013-01-08 DIAGNOSIS — I10 Essential (primary) hypertension: Secondary | ICD-10-CM | POA: Diagnosis not present

## 2013-01-08 DIAGNOSIS — Z87440 Personal history of urinary (tract) infections: Secondary | ICD-10-CM | POA: Diagnosis not present

## 2013-01-08 DIAGNOSIS — R279 Unspecified lack of coordination: Secondary | ICD-10-CM | POA: Diagnosis not present

## 2013-01-08 DIAGNOSIS — R269 Unspecified abnormalities of gait and mobility: Secondary | ICD-10-CM | POA: Diagnosis not present

## 2013-01-09 DIAGNOSIS — R269 Unspecified abnormalities of gait and mobility: Secondary | ICD-10-CM | POA: Diagnosis not present

## 2013-01-09 DIAGNOSIS — I10 Essential (primary) hypertension: Secondary | ICD-10-CM | POA: Diagnosis not present

## 2013-01-09 DIAGNOSIS — G35 Multiple sclerosis: Secondary | ICD-10-CM | POA: Diagnosis not present

## 2013-01-09 DIAGNOSIS — R279 Unspecified lack of coordination: Secondary | ICD-10-CM | POA: Diagnosis not present

## 2013-01-09 DIAGNOSIS — Z87440 Personal history of urinary (tract) infections: Secondary | ICD-10-CM | POA: Diagnosis not present

## 2013-02-06 DIAGNOSIS — M25469 Effusion, unspecified knee: Secondary | ICD-10-CM | POA: Diagnosis not present

## 2013-02-06 DIAGNOSIS — F29 Unspecified psychosis not due to a substance or known physiological condition: Secondary | ICD-10-CM | POA: Diagnosis not present

## 2013-02-06 DIAGNOSIS — F411 Generalized anxiety disorder: Secondary | ICD-10-CM | POA: Diagnosis not present

## 2013-02-06 DIAGNOSIS — S8990XA Unspecified injury of unspecified lower leg, initial encounter: Secondary | ICD-10-CM | POA: Diagnosis not present

## 2013-02-06 DIAGNOSIS — I10 Essential (primary) hypertension: Secondary | ICD-10-CM | POA: Diagnosis not present

## 2013-02-06 DIAGNOSIS — R109 Unspecified abdominal pain: Secondary | ICD-10-CM | POA: Diagnosis not present

## 2013-02-06 DIAGNOSIS — Z7982 Long term (current) use of aspirin: Secondary | ICD-10-CM | POA: Diagnosis not present

## 2013-02-06 DIAGNOSIS — W050XXA Fall from non-moving wheelchair, initial encounter: Secondary | ICD-10-CM | POA: Diagnosis not present

## 2013-02-06 DIAGNOSIS — R413 Other amnesia: Secondary | ICD-10-CM | POA: Diagnosis not present

## 2013-02-06 DIAGNOSIS — I69998 Other sequelae following unspecified cerebrovascular disease: Secondary | ICD-10-CM | POA: Diagnosis not present

## 2013-02-06 DIAGNOSIS — F79 Unspecified intellectual disabilities: Secondary | ICD-10-CM | POA: Diagnosis not present

## 2013-02-06 DIAGNOSIS — S0990XA Unspecified injury of head, initial encounter: Secondary | ICD-10-CM | POA: Diagnosis not present

## 2013-02-06 DIAGNOSIS — R569 Unspecified convulsions: Secondary | ICD-10-CM | POA: Diagnosis not present

## 2013-02-06 DIAGNOSIS — Z79899 Other long term (current) drug therapy: Secondary | ICD-10-CM | POA: Diagnosis not present

## 2013-02-06 DIAGNOSIS — E785 Hyperlipidemia, unspecified: Secondary | ICD-10-CM | POA: Diagnosis not present

## 2013-03-04 DIAGNOSIS — Z79899 Other long term (current) drug therapy: Secondary | ICD-10-CM | POA: Diagnosis not present

## 2013-03-04 DIAGNOSIS — R5381 Other malaise: Secondary | ICD-10-CM | POA: Diagnosis not present

## 2013-03-04 DIAGNOSIS — R269 Unspecified abnormalities of gait and mobility: Secondary | ICD-10-CM | POA: Diagnosis not present

## 2013-03-04 DIAGNOSIS — G35 Multiple sclerosis: Secondary | ICD-10-CM | POA: Diagnosis not present

## 2013-03-10 DIAGNOSIS — Z23 Encounter for immunization: Secondary | ICD-10-CM | POA: Diagnosis not present

## 2013-03-10 DIAGNOSIS — E7800 Pure hypercholesterolemia, unspecified: Secondary | ICD-10-CM | POA: Diagnosis not present

## 2013-03-10 DIAGNOSIS — G35 Multiple sclerosis: Secondary | ICD-10-CM | POA: Diagnosis not present

## 2013-03-10 DIAGNOSIS — I10 Essential (primary) hypertension: Secondary | ICD-10-CM | POA: Diagnosis not present

## 2013-03-19 ENCOUNTER — Encounter (HOSPITAL_COMMUNITY): Payer: Self-pay | Admitting: Emergency Medicine

## 2013-03-19 ENCOUNTER — Emergency Department (HOSPITAL_COMMUNITY)
Admission: EM | Admit: 2013-03-19 | Discharge: 2013-03-19 | Disposition: A | Discharge status: Home / Self Care | Payer: Medicare Other | Attending: Emergency Medicine | Admitting: Emergency Medicine

## 2013-03-19 DIAGNOSIS — Z7982 Long term (current) use of aspirin: Secondary | ICD-10-CM | POA: Insufficient documentation

## 2013-03-19 DIAGNOSIS — F4389 Other reactions to severe stress: Secondary | ICD-10-CM | POA: Insufficient documentation

## 2013-03-19 DIAGNOSIS — G35 Multiple sclerosis: Secondary | ICD-10-CM | POA: Insufficient documentation

## 2013-03-19 DIAGNOSIS — F1021 Alcohol dependence, in remission: Secondary | ICD-10-CM | POA: Diagnosis not present

## 2013-03-19 DIAGNOSIS — Z8639 Personal history of other endocrine, nutritional and metabolic disease: Secondary | ICD-10-CM | POA: Insufficient documentation

## 2013-03-19 DIAGNOSIS — F439 Reaction to severe stress, unspecified: Secondary | ICD-10-CM

## 2013-03-19 DIAGNOSIS — F411 Generalized anxiety disorder: Secondary | ICD-10-CM | POA: Diagnosis not present

## 2013-03-19 DIAGNOSIS — Z860100 Personal history of colon polyps, unspecified: Secondary | ICD-10-CM | POA: Insufficient documentation

## 2013-03-19 DIAGNOSIS — F438 Other reactions to severe stress: Secondary | ICD-10-CM | POA: Insufficient documentation

## 2013-03-19 DIAGNOSIS — Z862 Personal history of diseases of the blood and blood-forming organs and certain disorders involving the immune mechanism: Secondary | ICD-10-CM | POA: Diagnosis not present

## 2013-03-19 DIAGNOSIS — F17200 Nicotine dependence, unspecified, uncomplicated: Secondary | ICD-10-CM | POA: Insufficient documentation

## 2013-03-19 DIAGNOSIS — R279 Unspecified lack of coordination: Secondary | ICD-10-CM | POA: Diagnosis not present

## 2013-03-19 DIAGNOSIS — F489 Nonpsychotic mental disorder, unspecified: Secondary | ICD-10-CM | POA: Diagnosis not present

## 2013-03-19 DIAGNOSIS — Z8739 Personal history of other diseases of the musculoskeletal system and connective tissue: Secondary | ICD-10-CM | POA: Insufficient documentation

## 2013-03-19 DIAGNOSIS — R6889 Other general symptoms and signs: Secondary | ICD-10-CM | POA: Diagnosis not present

## 2013-03-19 HISTORY — DX: Hypokalemia: E87.6

## 2013-03-19 HISTORY — DX: Anxiety disorder, unspecified: F41.9

## 2013-03-19 HISTORY — DX: Rhabdomyolysis: M62.82

## 2013-03-19 LAB — RAPID URINE DRUG SCREEN, HOSP PERFORMED
Amphetamines: NOT DETECTED
Benzodiazepines: NOT DETECTED
Cocaine: NOT DETECTED
Opiates: NOT DETECTED

## 2013-03-19 LAB — BASIC METABOLIC PANEL
CO2: 26 mEq/L (ref 19–32)
Chloride: 99 mEq/L (ref 96–112)
Creatinine, Ser: 0.61 mg/dL (ref 0.50–1.10)
Potassium: 3.2 mEq/L — ABNORMAL LOW (ref 3.5–5.1)

## 2013-03-19 LAB — URINALYSIS, ROUTINE W REFLEX MICROSCOPIC
Bilirubin Urine: NEGATIVE
Glucose, UA: NEGATIVE mg/dL
Ketones, ur: NEGATIVE mg/dL
Protein, ur: NEGATIVE mg/dL

## 2013-03-19 LAB — CBC WITH DIFFERENTIAL/PLATELET
Basophils Absolute: 0 10*3/uL (ref 0.0–0.1)
HCT: 39.9 % (ref 36.0–46.0)
Hemoglobin: 14.6 g/dL (ref 12.0–15.0)
Lymphocytes Relative: 44 % (ref 12–46)
Monocytes Absolute: 0.5 10*3/uL (ref 0.1–1.0)
Neutro Abs: 3.6 10*3/uL (ref 1.7–7.7)
RDW: 14 % (ref 11.5–15.5)
WBC: 7.8 10*3/uL (ref 4.0–10.5)

## 2013-03-19 LAB — URINE MICROSCOPIC-ADD ON

## 2013-03-19 LAB — ETHANOL: Alcohol, Ethyl (B): <11 mg/dL (ref 0–11)

## 2013-03-19 NOTE — ED Provider Notes (Signed)
CSN: [id number]     Arrival date & time 03/19/13  2054 History  This chart was scribed for Shelda Jakes, MD by Leone Payor, ED Scribe. This patient was seen in room APAH8/APAH8 and the patient's care was started 9:33 PM.    Chief Complaint  Patient presents with  . V70.1    The history is provided by the patient. No language interpreter was used.    HPI Comments: Tami Nolan is a 52 y.o. female who presents to the Emergency Department requesting medical clearance today. Per nursing note, pt was [facility] for being violent towards staff at [facility]. Pt had said she was going to leave even though she is unable to walk due to multiple sclerosis. Pt states she has been staying at this group home for the past 2-3 weeks but does not want to be there. She denies SI, HI, auditory/visual hallucinations.   PCP Dr. Felecia Shelling Past Medical History  Diagnosis Date  . Hepatitis     ETOH related   . ETOH abuse   . Hx of abnormal cervical Pap smear 1997  . Colon polyps 1997  . Genital warts   . Multiple sclerosis   . Hypokalemia   . Rhabdomyolysis   . Anxiety    Past Surgical History  Procedure Laterality Date  . Bilatetral tubal ligation     Family History  Problem Relation Age of Onset  . Coronary artery disease Brother   . Diabetes Brother   . Hypertension Brother   . Hypertension Mother   . Diabetes Mother   . Hypertension Sister   . Alcohol abuse      family history    History  Substance Use Topics  . Smoking status: Current Every Day Smoker -- 1.00 packs/day for 15 years    Types: Cigarettes  . Smokeless tobacco: Current User  . Alcohol Use: No     Comment: Hx of ETOH abuse - dry since 1997.   OB History   Grav Para Term Preterm Abortions TAB SAB Ect Mult Living                 Review of Systems  Constitutional: Negative for fever and chills.  HENT: Negative for congestion.   Eyes: Negative for visual disturbance.   Respiratory: Negative for shortness of breath.   Cardiovascular: Negative for chest pain.  Gastrointestinal: Negative for abdominal pain.  Genitourinary: Negative for dysuria.  Musculoskeletal: Negative for back pain.  Skin: Negative for rash.  Neurological: Negative for headaches.  Psychiatric/Behavioral: Negative for suicidal ideas and confusion.  All other systems reviewed and are negative.    Allergies  Review of patient's allergies indicates no known allergies.  Home Medications   Current Outpatient Rx  Name  Route  Sig  Dispense  Refill  . aspirin 325 MG tablet   Oral   Take 1 tablet (325 mg total) by mouth daily.   30 tablet   1   . cyclobenzaprine (FLEXERIL) 10 MG tablet   Oral   Take 1 tablet (10 mg total) by mouth 2 (two) times daily.   30 tablet   1   . dalfampridine (AMPYRA) 10 MG TB12   Oral   Take 10 mg by mouth 2 (two) times daily.         Marland Kitchen HYDROcodone-acetaminophen (NORCO/VICODIN) 5-325 MG per tablet   Oral   Take 1 tablet by mouth every 4 (four) hours as needed.  Pain   30 tablet   0   . levETIRAcetam (KEPPRA) 500 MG tablet   Oral   Take 1 tablet (500 mg total) by mouth 2 (two) times daily.   60 tablet   1   . loratadine (CLARITIN) 10 MG tablet   Oral   Take 1 tablet (10 mg total) by mouth daily.   30 tablet   1   . LORazepam (ATIVAN) 2 MG tablet   Oral   Take 1 tablet (2 mg total) by mouth every 4 (four) hours as needed for anxiety.   30 tablet   0   . metoprolol tartrate (LOPRESSOR) 25 MG tablet   Oral   Take 1 tablet (25 mg total) by mouth 2 (two) times daily.   60 tablet   1   . montelukast (SINGULAIR) 10 MG tablet   Oral   Take 1 tablet (10 mg total) by mouth at bedtime.   30 tablet   1   . Multiple Vitamin (MULTIVITAMIN WITH MINERALS) TABS   Oral   Take 1 tablet by mouth daily.   30 tablet   1   . simvastatin (ZOCOR) 40 MG tablet   Oral   Take 1 tablet (40 mg total) by mouth at bedtime.   30 tablet   1   .  traZODone (DESYREL) 100 MG tablet   Oral   Take 200 mg by mouth at bedtime.         Marland Kitchen venlafaxine XR (EFFEXOR-XR) 150 MG 24 hr capsule   Oral   Take 1 capsule (150 mg total) by mouth daily.   30 capsule   1   . venlafaxine XR (EFFEXOR-XR) 75 MG 24 hr capsule   Oral   Take 75 mg by mouth daily. Taken with 150mg  capsule         . senna-docusate (SENOKOT-S) 8.6-50 MG per tablet   Oral   Take 1 tablet by mouth at bedtime as needed.   30 tablet   1    BP 161/81  Pulse 84  Temp(Src) 97.6 F (36.4 C) (Oral)  Resp 20  Ht 5\' 7"  (1.702 m)  Wt 200 lb (90.719 kg)  BMI 31.32 kg/m2  SpO2 98% Physical Exam  Nursing note and vitals reviewed. Constitutional: She is oriented to person, place, and time. She appears well-developed and well-nourished.  HENT:  Head: Normocephalic and atraumatic.  Eyes: Conjunctivae and EOM are normal. Pupils are equal, round, and reactive to light.  Neck: Normal range of motion. Neck supple.  Cardiovascular: Normal rate, regular rhythm and normal heart sounds.  Exam reveals no gallop and no friction rub.   No murmur heard. Pulmonary/Chest: Effort normal and breath sounds normal. No respiratory distress. She has no wheezes. She has no rales. She exhibits no tenderness.  Abdominal: Soft. Bowel sounds are normal. She exhibits no distension and no mass. There is no tenderness. There is no rebound and no guarding.  Musculoskeletal: Normal range of motion.  Moves upper extremities normally.    Neurological: She is alert and oriented to person, place, and time.  Skin: Skin is warm and dry.  Psychiatric: She has a normal mood and affect.    ED Course  Procedures   DIAGNOSTIC STUDIES:   COORDINATION OF CARE: 9:33 PM Discussed treatment plan with pt at bedside and pt agreed to plan.    Labs Review Labs Reviewed  CBC WITH DIFFERENTIAL - Abnormal; Notable for the following:    MCHC 36.6 (*)  All other components within normal limits  BASIC  METABOLIC PANEL - Abnormal; Notable for the following:    Potassium 3.2 (*)    Glucose, Bld 137 (*)    All other components within normal limits  URINALYSIS, ROUTINE W REFLEX MICROSCOPIC - Abnormal; Notable for the following:    Hgb urine dipstick SMALL (*)    All other components within normal limits  URINE MICROSCOPIC-ADD ON - Abnormal; Notable for the following:    Squamous Epithelial / LPF MANY (*)    Bacteria, UA MANY (*)    All other components within normal limits  ETHANOL  URINE RAPID DRUG SCREEN (HOSP PERFORMED)   Results for orders placed during the hospital encounter of 03/19/13  CBC WITH DIFFERENTIAL      Result Value Range   WBC 7.8  4.0 - 10.5 K/uL   RBC 5.08  3.87 - 5.11 MIL/uL   Hemoglobin 14.6  12.0 - 15.0 g/dL   HCT 16.1  09.6 - 04.5 %   MCV 78.5  78.0 - 100.0 fL   MCH 28.7  26.0 - 34.0 pg   MCHC 36.6 (*) 30.0 - 36.0 g/dL   RDW 40.9  81.1 - 91.4 %   Platelets 296  150 - 400 K/uL   Neutrophils Relative % 47  43 - 77 %   Neutro Abs 3.6  1.7 - 7.7 K/uL   Lymphocytes Relative 44  12 - 46 %   Lymphs Abs 3.5  0.7 - 4.0 K/uL   Monocytes Relative 7  3 - 12 %   Monocytes Absolute 0.5  0.1 - 1.0 K/uL   Eosinophils Relative 2  0 - 5 %   Eosinophils Absolute 0.2  0.0 - 0.7 K/uL   Basophils Relative 0  0 - 1 %   Basophils Absolute 0.0  0.0 - 0.1 K/uL  BASIC METABOLIC PANEL      Result Value Range   Sodium 135  135 - 145 mEq/L   Potassium 3.2 (*) 3.5 - 5.1 mEq/L   Chloride 99  96 - 112 mEq/L   CO2 26  19 - 32 mEq/L   Glucose, Bld 137 (*) 70 - 99 mg/dL   BUN 10  6 - 23 mg/dL   Creatinine, Ser 7.82  0.50 - 1.10 mg/dL   Calcium 9.6  8.4 - 95.6 mg/dL   GFR calc non Af Amer >90  >90 mL/min   GFR calc Af Amer >90  >90 mL/min  ETHANOL      Result Value Range   Alcohol, Ethyl (B) <11  0 - 11 mg/dL  URINE RAPID DRUG SCREEN (HOSP PERFORMED)      Result Value Range   Opiates NONE DETECTED  NONE DETECTED   Cocaine NONE DETECTED  NONE DETECTED   Benzodiazepines NONE  DETECTED  NONE DETECTED   Amphetamines NONE DETECTED  NONE DETECTED   Tetrahydrocannabinol NONE DETECTED  NONE DETECTED   Barbiturates NONE DETECTED  NONE DETECTED  URINALYSIS, ROUTINE W REFLEX MICROSCOPIC      Result Value Range   Color, Urine YELLOW  YELLOW   APPearance CLEAR  CLEAR   Specific Gravity, Urine 1.020  1.005 - 1.030   pH 5.5  5.0 - 8.0   Glucose, UA NEGATIVE  NEGATIVE mg/dL   Hgb urine dipstick SMALL (*) NEGATIVE   Bilirubin Urine NEGATIVE  NEGATIVE   Ketones, ur NEGATIVE  NEGATIVE mg/dL   Protein, ur NEGATIVE  NEGATIVE mg/dL   Urobilinogen, UA 0.2  0.0 - 1.0 mg/dL   Nitrite NEGATIVE  NEGATIVE   Leukocytes, UA NEGATIVE  NEGATIVE  URINE MICROSCOPIC-ADD ON      Result Value Range   Squamous Epithelial / LPF MANY (*) RARE   WBC, UA 0-2  <3 WBC/hpf   RBC / HPF 0-2  <3 RBC/hpf   Bacteria, UA MANY (*) RARE     Imaging Review No results found.  EKG Interpretation   None       MDM   1. Situational stress    Patient without suicidal or homicidal ideation. Patient has no need for behavioral health admission. IVC can be rescinded. Patient to return back to the group home rest home. Patient would prefer not to be living there. This is situational stress situation.  I personally performed the services described in this documentation, which was scribed in my presence. The recorded information has been reviewed and is accurate.    Shelda Jakes, MD 03/19/13 2223

## 2013-03-19 NOTE — ED Notes (Signed)
Report given to [place] at [facility]

## 2013-03-19 NOTE — ED Notes (Signed)
Dr. Zackowski at bedside  

## 2013-03-19 NOTE — ED Notes (Signed)
Patient brought to ED via [facility] under [facility]C for being violent toward staff at [facility] and stating she is going to leave.  Patient does not walk.

## 2013-03-31 DIAGNOSIS — F329 Major depressive disorder, single episode, unspecified: Secondary | ICD-10-CM | POA: Diagnosis not present

## 2013-04-11 DIAGNOSIS — R569 Unspecified convulsions: Secondary | ICD-10-CM | POA: Diagnosis not present

## 2013-04-11 DIAGNOSIS — G35 Multiple sclerosis: Secondary | ICD-10-CM | POA: Diagnosis not present

## 2013-04-11 DIAGNOSIS — I6789 Other cerebrovascular disease: Secondary | ICD-10-CM | POA: Diagnosis not present

## 2013-04-28 DIAGNOSIS — F329 Major depressive disorder, single episode, unspecified: Secondary | ICD-10-CM | POA: Diagnosis not present

## 2013-05-12 DIAGNOSIS — F329 Major depressive disorder, single episode, unspecified: Secondary | ICD-10-CM | POA: Diagnosis not present

## 2013-05-12 DIAGNOSIS — Z1231 Encounter for screening mammogram for malignant neoplasm of breast: Secondary | ICD-10-CM | POA: Diagnosis not present

## 2013-05-12 DIAGNOSIS — G40909 Epilepsy, unspecified, not intractable, without status epilepticus: Secondary | ICD-10-CM | POA: Diagnosis not present

## 2013-05-12 DIAGNOSIS — Z8673 Personal history of transient ischemic attack (TIA), and cerebral infarction without residual deficits: Secondary | ICD-10-CM | POA: Diagnosis not present

## 2013-05-12 DIAGNOSIS — G35 Multiple sclerosis: Secondary | ICD-10-CM | POA: Diagnosis not present

## 2013-05-12 DIAGNOSIS — IMO0001 Reserved for inherently not codable concepts without codable children: Secondary | ICD-10-CM | POA: Diagnosis not present

## 2013-05-14 DIAGNOSIS — F329 Major depressive disorder, single episode, unspecified: Secondary | ICD-10-CM | POA: Diagnosis not present

## 2013-05-14 DIAGNOSIS — IMO0001 Reserved for inherently not codable concepts without codable children: Secondary | ICD-10-CM | POA: Diagnosis not present

## 2013-05-14 DIAGNOSIS — G35 Multiple sclerosis: Secondary | ICD-10-CM | POA: Diagnosis not present

## 2013-05-14 DIAGNOSIS — G40909 Epilepsy, unspecified, not intractable, without status epilepticus: Secondary | ICD-10-CM | POA: Diagnosis not present

## 2013-05-14 DIAGNOSIS — Z8673 Personal history of transient ischemic attack (TIA), and cerebral infarction without residual deficits: Secondary | ICD-10-CM | POA: Diagnosis not present

## 2013-05-16 DIAGNOSIS — G40909 Epilepsy, unspecified, not intractable, without status epilepticus: Secondary | ICD-10-CM | POA: Diagnosis not present

## 2013-05-16 DIAGNOSIS — IMO0001 Reserved for inherently not codable concepts without codable children: Secondary | ICD-10-CM | POA: Diagnosis not present

## 2013-05-16 DIAGNOSIS — Z8673 Personal history of transient ischemic attack (TIA), and cerebral infarction without residual deficits: Secondary | ICD-10-CM | POA: Diagnosis not present

## 2013-05-16 DIAGNOSIS — G35 Multiple sclerosis: Secondary | ICD-10-CM | POA: Diagnosis not present

## 2013-05-16 DIAGNOSIS — F329 Major depressive disorder, single episode, unspecified: Secondary | ICD-10-CM | POA: Diagnosis not present

## 2013-05-19 DIAGNOSIS — F329 Major depressive disorder, single episode, unspecified: Secondary | ICD-10-CM | POA: Diagnosis not present

## 2013-05-19 DIAGNOSIS — IMO0001 Reserved for inherently not codable concepts without codable children: Secondary | ICD-10-CM | POA: Diagnosis not present

## 2013-05-19 DIAGNOSIS — G40909 Epilepsy, unspecified, not intractable, without status epilepticus: Secondary | ICD-10-CM | POA: Diagnosis not present

## 2013-05-19 DIAGNOSIS — G35 Multiple sclerosis: Secondary | ICD-10-CM | POA: Diagnosis not present

## 2013-05-19 DIAGNOSIS — Z8673 Personal history of transient ischemic attack (TIA), and cerebral infarction without residual deficits: Secondary | ICD-10-CM | POA: Diagnosis not present

## 2013-05-20 DIAGNOSIS — M109 Gout, unspecified: Secondary | ICD-10-CM | POA: Diagnosis not present

## 2013-05-20 DIAGNOSIS — G35 Multiple sclerosis: Secondary | ICD-10-CM | POA: Diagnosis not present

## 2013-05-21 DIAGNOSIS — F329 Major depressive disorder, single episode, unspecified: Secondary | ICD-10-CM | POA: Diagnosis not present

## 2013-05-21 DIAGNOSIS — Z8673 Personal history of transient ischemic attack (TIA), and cerebral infarction without residual deficits: Secondary | ICD-10-CM | POA: Diagnosis not present

## 2013-05-21 DIAGNOSIS — G35 Multiple sclerosis: Secondary | ICD-10-CM | POA: Diagnosis not present

## 2013-05-21 DIAGNOSIS — G40909 Epilepsy, unspecified, not intractable, without status epilepticus: Secondary | ICD-10-CM | POA: Diagnosis not present

## 2013-05-21 DIAGNOSIS — IMO0001 Reserved for inherently not codable concepts without codable children: Secondary | ICD-10-CM | POA: Diagnosis not present

## 2013-05-23 DIAGNOSIS — G40909 Epilepsy, unspecified, not intractable, without status epilepticus: Secondary | ICD-10-CM | POA: Diagnosis not present

## 2013-05-23 DIAGNOSIS — IMO0001 Reserved for inherently not codable concepts without codable children: Secondary | ICD-10-CM | POA: Diagnosis not present

## 2013-05-23 DIAGNOSIS — G35 Multiple sclerosis: Secondary | ICD-10-CM | POA: Diagnosis not present

## 2013-05-23 DIAGNOSIS — F329 Major depressive disorder, single episode, unspecified: Secondary | ICD-10-CM | POA: Diagnosis not present

## 2013-05-23 DIAGNOSIS — Z8673 Personal history of transient ischemic attack (TIA), and cerebral infarction without residual deficits: Secondary | ICD-10-CM | POA: Diagnosis not present

## 2013-05-26 ENCOUNTER — Other Ambulatory Visit: Payer: Self-pay | Admitting: Neurology

## 2013-05-26 DIAGNOSIS — R269 Unspecified abnormalities of gait and mobility: Secondary | ICD-10-CM | POA: Diagnosis not present

## 2013-05-26 DIAGNOSIS — Z79899 Other long term (current) drug therapy: Secondary | ICD-10-CM | POA: Diagnosis not present

## 2013-05-26 DIAGNOSIS — G35 Multiple sclerosis: Secondary | ICD-10-CM | POA: Diagnosis not present

## 2013-05-26 DIAGNOSIS — M79609 Pain in unspecified limb: Secondary | ICD-10-CM | POA: Diagnosis not present

## 2013-05-26 DIAGNOSIS — R52 Pain, unspecified: Secondary | ICD-10-CM

## 2013-05-27 DIAGNOSIS — G35 Multiple sclerosis: Secondary | ICD-10-CM | POA: Diagnosis not present

## 2013-05-27 DIAGNOSIS — Z8673 Personal history of transient ischemic attack (TIA), and cerebral infarction without residual deficits: Secondary | ICD-10-CM | POA: Diagnosis not present

## 2013-05-27 DIAGNOSIS — F329 Major depressive disorder, single episode, unspecified: Secondary | ICD-10-CM | POA: Diagnosis not present

## 2013-05-27 DIAGNOSIS — IMO0001 Reserved for inherently not codable concepts without codable children: Secondary | ICD-10-CM | POA: Diagnosis not present

## 2013-05-27 DIAGNOSIS — G40909 Epilepsy, unspecified, not intractable, without status epilepticus: Secondary | ICD-10-CM | POA: Diagnosis not present

## 2013-05-28 ENCOUNTER — Ambulatory Visit (HOSPITAL_COMMUNITY): Payer: Medicare Other

## 2013-05-28 DIAGNOSIS — Z8673 Personal history of transient ischemic attack (TIA), and cerebral infarction without residual deficits: Secondary | ICD-10-CM | POA: Diagnosis not present

## 2013-05-28 DIAGNOSIS — F329 Major depressive disorder, single episode, unspecified: Secondary | ICD-10-CM | POA: Diagnosis not present

## 2013-05-28 DIAGNOSIS — IMO0001 Reserved for inherently not codable concepts without codable children: Secondary | ICD-10-CM | POA: Diagnosis not present

## 2013-05-28 DIAGNOSIS — G35 Multiple sclerosis: Secondary | ICD-10-CM | POA: Diagnosis not present

## 2013-05-28 DIAGNOSIS — G40909 Epilepsy, unspecified, not intractable, without status epilepticus: Secondary | ICD-10-CM | POA: Diagnosis not present

## 2013-06-02 ENCOUNTER — Ambulatory Visit (HOSPITAL_COMMUNITY)
Admission: RE | Admit: 2013-06-02 | Discharge: 2013-06-02 | Disposition: A | Discharge status: Home / Self Care | Payer: Medicare Other | Source: Ambulatory Visit | Attending: Neurology | Admitting: Neurology

## 2013-06-02 ENCOUNTER — Other Ambulatory Visit: Payer: Self-pay | Admitting: Neurology

## 2013-06-02 DIAGNOSIS — IMO0001 Reserved for inherently not codable concepts without codable children: Secondary | ICD-10-CM | POA: Diagnosis not present

## 2013-06-02 DIAGNOSIS — R52 Pain, unspecified: Secondary | ICD-10-CM

## 2013-06-02 DIAGNOSIS — M79609 Pain in unspecified limb: Secondary | ICD-10-CM | POA: Diagnosis not present

## 2013-06-02 DIAGNOSIS — F329 Major depressive disorder, single episode, unspecified: Secondary | ICD-10-CM | POA: Diagnosis not present

## 2013-06-02 DIAGNOSIS — G40909 Epilepsy, unspecified, not intractable, without status epilepticus: Secondary | ICD-10-CM | POA: Diagnosis not present

## 2013-06-02 DIAGNOSIS — Z8673 Personal history of transient ischemic attack (TIA), and cerebral infarction without residual deficits: Secondary | ICD-10-CM | POA: Diagnosis not present

## 2013-06-02 DIAGNOSIS — G35 Multiple sclerosis: Secondary | ICD-10-CM | POA: Diagnosis not present

## 2013-06-04 DIAGNOSIS — IMO0001 Reserved for inherently not codable concepts without codable children: Secondary | ICD-10-CM | POA: Diagnosis not present

## 2013-06-04 DIAGNOSIS — F329 Major depressive disorder, single episode, unspecified: Secondary | ICD-10-CM | POA: Diagnosis not present

## 2013-06-04 DIAGNOSIS — G35 Multiple sclerosis: Secondary | ICD-10-CM | POA: Diagnosis not present

## 2013-06-04 DIAGNOSIS — Z8673 Personal history of transient ischemic attack (TIA), and cerebral infarction without residual deficits: Secondary | ICD-10-CM | POA: Diagnosis not present

## 2013-06-04 DIAGNOSIS — G40909 Epilepsy, unspecified, not intractable, without status epilepticus: Secondary | ICD-10-CM | POA: Diagnosis not present

## 2013-06-06 DIAGNOSIS — G35 Multiple sclerosis: Secondary | ICD-10-CM | POA: Diagnosis not present

## 2013-06-06 DIAGNOSIS — F3289 Other specified depressive episodes: Secondary | ICD-10-CM | POA: Diagnosis not present

## 2013-06-06 DIAGNOSIS — G40909 Epilepsy, unspecified, not intractable, without status epilepticus: Secondary | ICD-10-CM | POA: Diagnosis not present

## 2013-06-06 DIAGNOSIS — IMO0001 Reserved for inherently not codable concepts without codable children: Secondary | ICD-10-CM | POA: Diagnosis not present

## 2013-06-06 DIAGNOSIS — F329 Major depressive disorder, single episode, unspecified: Secondary | ICD-10-CM | POA: Diagnosis not present

## 2013-06-06 DIAGNOSIS — Z8673 Personal history of transient ischemic attack (TIA), and cerebral infarction without residual deficits: Secondary | ICD-10-CM | POA: Diagnosis not present

## 2013-06-09 DIAGNOSIS — F3289 Other specified depressive episodes: Secondary | ICD-10-CM | POA: Diagnosis not present

## 2013-06-09 DIAGNOSIS — Z8673 Personal history of transient ischemic attack (TIA), and cerebral infarction without residual deficits: Secondary | ICD-10-CM | POA: Diagnosis not present

## 2013-06-09 DIAGNOSIS — G40909 Epilepsy, unspecified, not intractable, without status epilepticus: Secondary | ICD-10-CM | POA: Diagnosis not present

## 2013-06-09 DIAGNOSIS — F329 Major depressive disorder, single episode, unspecified: Secondary | ICD-10-CM | POA: Diagnosis not present

## 2013-06-09 DIAGNOSIS — G35 Multiple sclerosis: Secondary | ICD-10-CM | POA: Diagnosis not present

## 2013-06-09 DIAGNOSIS — IMO0001 Reserved for inherently not codable concepts without codable children: Secondary | ICD-10-CM | POA: Diagnosis not present

## 2013-06-10 DIAGNOSIS — IMO0001 Reserved for inherently not codable concepts without codable children: Secondary | ICD-10-CM | POA: Diagnosis not present

## 2013-06-10 DIAGNOSIS — G40909 Epilepsy, unspecified, not intractable, without status epilepticus: Secondary | ICD-10-CM | POA: Diagnosis not present

## 2013-06-10 DIAGNOSIS — Z8673 Personal history of transient ischemic attack (TIA), and cerebral infarction without residual deficits: Secondary | ICD-10-CM | POA: Diagnosis not present

## 2013-06-10 DIAGNOSIS — F329 Major depressive disorder, single episode, unspecified: Secondary | ICD-10-CM | POA: Diagnosis not present

## 2013-06-10 DIAGNOSIS — F3289 Other specified depressive episodes: Secondary | ICD-10-CM | POA: Diagnosis not present

## 2013-06-10 DIAGNOSIS — G35 Multiple sclerosis: Secondary | ICD-10-CM | POA: Diagnosis not present

## 2013-06-11 DIAGNOSIS — E119 Type 2 diabetes mellitus without complications: Secondary | ICD-10-CM | POA: Diagnosis not present

## 2013-06-11 DIAGNOSIS — I10 Essential (primary) hypertension: Secondary | ICD-10-CM | POA: Diagnosis not present

## 2013-06-11 DIAGNOSIS — Z79899 Other long term (current) drug therapy: Secondary | ICD-10-CM | POA: Diagnosis not present

## 2013-06-11 DIAGNOSIS — E7800 Pure hypercholesterolemia, unspecified: Secondary | ICD-10-CM | POA: Diagnosis not present

## 2013-06-11 DIAGNOSIS — M109 Gout, unspecified: Secondary | ICD-10-CM | POA: Diagnosis not present

## 2013-06-16 DIAGNOSIS — G35 Multiple sclerosis: Secondary | ICD-10-CM | POA: Diagnosis not present

## 2013-06-16 DIAGNOSIS — R569 Unspecified convulsions: Secondary | ICD-10-CM | POA: Diagnosis not present

## 2013-06-16 DIAGNOSIS — I6789 Other cerebrovascular disease: Secondary | ICD-10-CM | POA: Diagnosis not present

## 2013-06-17 DIAGNOSIS — F3289 Other specified depressive episodes: Secondary | ICD-10-CM | POA: Diagnosis not present

## 2013-06-17 DIAGNOSIS — G40909 Epilepsy, unspecified, not intractable, without status epilepticus: Secondary | ICD-10-CM | POA: Diagnosis not present

## 2013-06-17 DIAGNOSIS — F329 Major depressive disorder, single episode, unspecified: Secondary | ICD-10-CM | POA: Diagnosis not present

## 2013-06-17 DIAGNOSIS — IMO0001 Reserved for inherently not codable concepts without codable children: Secondary | ICD-10-CM | POA: Diagnosis not present

## 2013-06-17 DIAGNOSIS — G35 Multiple sclerosis: Secondary | ICD-10-CM | POA: Diagnosis not present

## 2013-06-17 DIAGNOSIS — Z8673 Personal history of transient ischemic attack (TIA), and cerebral infarction without residual deficits: Secondary | ICD-10-CM | POA: Diagnosis not present

## 2013-06-18 DIAGNOSIS — IMO0001 Reserved for inherently not codable concepts without codable children: Secondary | ICD-10-CM | POA: Diagnosis not present

## 2013-06-18 DIAGNOSIS — G40909 Epilepsy, unspecified, not intractable, without status epilepticus: Secondary | ICD-10-CM | POA: Diagnosis not present

## 2013-06-18 DIAGNOSIS — F3289 Other specified depressive episodes: Secondary | ICD-10-CM | POA: Diagnosis not present

## 2013-06-18 DIAGNOSIS — G35 Multiple sclerosis: Secondary | ICD-10-CM | POA: Diagnosis not present

## 2013-06-18 DIAGNOSIS — F329 Major depressive disorder, single episode, unspecified: Secondary | ICD-10-CM | POA: Diagnosis not present

## 2013-06-18 DIAGNOSIS — Z8673 Personal history of transient ischemic attack (TIA), and cerebral infarction without residual deficits: Secondary | ICD-10-CM | POA: Diagnosis not present

## 2013-06-19 DIAGNOSIS — F3289 Other specified depressive episodes: Secondary | ICD-10-CM | POA: Diagnosis not present

## 2013-06-19 DIAGNOSIS — IMO0001 Reserved for inherently not codable concepts without codable children: Secondary | ICD-10-CM | POA: Diagnosis not present

## 2013-06-19 DIAGNOSIS — G35 Multiple sclerosis: Secondary | ICD-10-CM | POA: Diagnosis not present

## 2013-06-19 DIAGNOSIS — F329 Major depressive disorder, single episode, unspecified: Secondary | ICD-10-CM | POA: Diagnosis not present

## 2013-06-19 DIAGNOSIS — Z8673 Personal history of transient ischemic attack (TIA), and cerebral infarction without residual deficits: Secondary | ICD-10-CM | POA: Diagnosis not present

## 2013-06-19 DIAGNOSIS — G40909 Epilepsy, unspecified, not intractable, without status epilepticus: Secondary | ICD-10-CM | POA: Diagnosis not present

## 2013-06-24 DIAGNOSIS — Z8673 Personal history of transient ischemic attack (TIA), and cerebral infarction without residual deficits: Secondary | ICD-10-CM | POA: Diagnosis not present

## 2013-06-24 DIAGNOSIS — F329 Major depressive disorder, single episode, unspecified: Secondary | ICD-10-CM | POA: Diagnosis not present

## 2013-06-24 DIAGNOSIS — G35 Multiple sclerosis: Secondary | ICD-10-CM | POA: Diagnosis not present

## 2013-06-24 DIAGNOSIS — F3289 Other specified depressive episodes: Secondary | ICD-10-CM | POA: Diagnosis not present

## 2013-06-24 DIAGNOSIS — IMO0001 Reserved for inherently not codable concepts without codable children: Secondary | ICD-10-CM | POA: Diagnosis not present

## 2013-06-24 DIAGNOSIS — G40909 Epilepsy, unspecified, not intractable, without status epilepticus: Secondary | ICD-10-CM | POA: Diagnosis not present

## 2013-06-25 DIAGNOSIS — F329 Major depressive disorder, single episode, unspecified: Secondary | ICD-10-CM | POA: Diagnosis not present

## 2013-06-25 DIAGNOSIS — F3289 Other specified depressive episodes: Secondary | ICD-10-CM | POA: Diagnosis not present

## 2013-06-26 DIAGNOSIS — G35 Multiple sclerosis: Secondary | ICD-10-CM | POA: Diagnosis not present

## 2013-06-26 DIAGNOSIS — IMO0001 Reserved for inherently not codable concepts without codable children: Secondary | ICD-10-CM | POA: Diagnosis not present

## 2013-06-26 DIAGNOSIS — G40909 Epilepsy, unspecified, not intractable, without status epilepticus: Secondary | ICD-10-CM | POA: Diagnosis not present

## 2013-06-26 DIAGNOSIS — F329 Major depressive disorder, single episode, unspecified: Secondary | ICD-10-CM | POA: Diagnosis not present

## 2013-06-26 DIAGNOSIS — Z8673 Personal history of transient ischemic attack (TIA), and cerebral infarction without residual deficits: Secondary | ICD-10-CM | POA: Diagnosis not present

## 2013-06-26 DIAGNOSIS — F3289 Other specified depressive episodes: Secondary | ICD-10-CM | POA: Diagnosis not present

## 2013-07-02 DIAGNOSIS — F3289 Other specified depressive episodes: Secondary | ICD-10-CM | POA: Diagnosis not present

## 2013-07-02 DIAGNOSIS — Z8673 Personal history of transient ischemic attack (TIA), and cerebral infarction without residual deficits: Secondary | ICD-10-CM | POA: Diagnosis not present

## 2013-07-02 DIAGNOSIS — G40909 Epilepsy, unspecified, not intractable, without status epilepticus: Secondary | ICD-10-CM | POA: Diagnosis not present

## 2013-07-02 DIAGNOSIS — IMO0001 Reserved for inherently not codable concepts without codable children: Secondary | ICD-10-CM | POA: Diagnosis not present

## 2013-07-02 DIAGNOSIS — G35 Multiple sclerosis: Secondary | ICD-10-CM | POA: Diagnosis not present

## 2013-07-02 DIAGNOSIS — F329 Major depressive disorder, single episode, unspecified: Secondary | ICD-10-CM | POA: Diagnosis not present

## 2013-07-06 ENCOUNTER — Inpatient Hospital Stay (HOSPITAL_COMMUNITY)
Admission: EM | Admit: 2013-07-06 | Discharge: 2013-07-10 | DRG: 071 | Disposition: A | Discharge status: Nursing Home (Includes ICF) | Payer: Medicare Other | Attending: Internal Medicine | Admitting: Internal Medicine

## 2013-07-06 ENCOUNTER — Emergency Department (HOSPITAL_COMMUNITY): Payer: Medicare Other

## 2013-07-06 ENCOUNTER — Encounter (HOSPITAL_COMMUNITY): Payer: Self-pay | Admitting: Emergency Medicine

## 2013-07-06 DIAGNOSIS — E872 Acidosis, unspecified: Secondary | ICD-10-CM | POA: Diagnosis present

## 2013-07-06 DIAGNOSIS — R4701 Aphasia: Secondary | ICD-10-CM | POA: Diagnosis present

## 2013-07-06 DIAGNOSIS — Z9119 Patient's noncompliance with other medical treatment and regimen: Secondary | ICD-10-CM

## 2013-07-06 DIAGNOSIS — R569 Unspecified convulsions: Secondary | ICD-10-CM | POA: Diagnosis not present

## 2013-07-06 DIAGNOSIS — Z8249 Family history of ischemic heart disease and other diseases of the circulatory system: Secondary | ICD-10-CM | POA: Diagnosis not present

## 2013-07-06 DIAGNOSIS — F411 Generalized anxiety disorder: Secondary | ICD-10-CM | POA: Diagnosis present

## 2013-07-06 DIAGNOSIS — Z7982 Long term (current) use of aspirin: Secondary | ICD-10-CM

## 2013-07-06 DIAGNOSIS — I10 Essential (primary) hypertension: Secondary | ICD-10-CM | POA: Diagnosis not present

## 2013-07-06 DIAGNOSIS — Z79899 Other long term (current) drug therapy: Secondary | ICD-10-CM

## 2013-07-06 DIAGNOSIS — Z993 Dependence on wheelchair: Secondary | ICD-10-CM

## 2013-07-06 DIAGNOSIS — G40909 Epilepsy, unspecified, not intractable, without status epilepticus: Secondary | ICD-10-CM | POA: Diagnosis present

## 2013-07-06 DIAGNOSIS — G35 Multiple sclerosis: Secondary | ICD-10-CM | POA: Diagnosis not present

## 2013-07-06 DIAGNOSIS — Z8669 Personal history of other diseases of the nervous system and sense organs: Secondary | ICD-10-CM

## 2013-07-06 DIAGNOSIS — F17200 Nicotine dependence, unspecified, uncomplicated: Secondary | ICD-10-CM | POA: Diagnosis present

## 2013-07-06 DIAGNOSIS — I498 Other specified cardiac arrhythmias: Secondary | ICD-10-CM | POA: Diagnosis not present

## 2013-07-06 DIAGNOSIS — Z833 Family history of diabetes mellitus: Secondary | ICD-10-CM

## 2013-07-06 DIAGNOSIS — R000 Tachycardia, unspecified: Secondary | ICD-10-CM

## 2013-07-06 DIAGNOSIS — G9340 Encephalopathy, unspecified: Secondary | ICD-10-CM | POA: Diagnosis not present

## 2013-07-06 DIAGNOSIS — Z91199 Patient's noncompliance with other medical treatment and regimen due to unspecified reason: Secondary | ICD-10-CM

## 2013-07-06 DIAGNOSIS — Z8673 Personal history of transient ischemic attack (TIA), and cerebral infarction without residual deficits: Secondary | ICD-10-CM | POA: Diagnosis not present

## 2013-07-06 DIAGNOSIS — R4182 Altered mental status, unspecified: Secondary | ICD-10-CM | POA: Diagnosis not present

## 2013-07-06 HISTORY — DX: Cerebral infarction, unspecified: I63.9

## 2013-07-06 HISTORY — DX: Personal history of other medical treatment: Z92.89

## 2013-07-06 LAB — URINALYSIS W MICROSCOPIC + REFLEX CULTURE
BILIRUBIN URINE: NEGATIVE
GLUCOSE, UA: NEGATIVE mg/dL
Leukocytes, UA: NEGATIVE
Nitrite: NEGATIVE
PROTEIN: NEGATIVE mg/dL
Specific Gravity, Urine: 1.015 (ref 1.005–1.030)
Urobilinogen, UA: 0.2 mg/dL (ref 0.0–1.0)
pH: 6 (ref 5.0–8.0)

## 2013-07-06 LAB — BLOOD GAS, ARTERIAL
Acid-Base Excess: 2.3 mmol/L — ABNORMAL HIGH (ref 0.0–2.0)
Bicarbonate: 27.6 mEq/L — ABNORMAL HIGH (ref 20.0–24.0)
DRAWN BY: 213101
O2 CONTENT: 2 L/min
O2 Saturation: 95.6 %
PATIENT TEMPERATURE: 37
PH ART: 7.335 — AB (ref 7.350–7.450)
TCO2: 24.2 mmol/L (ref 0–100)
pCO2 arterial: 53.2 mmHg — ABNORMAL HIGH (ref 35.0–45.0)
pO2, Arterial: 89.1 mmHg (ref 80.0–100.0)

## 2013-07-06 LAB — CBC WITH DIFFERENTIAL/PLATELET
Basophils Absolute: 0 10*3/uL (ref 0.0–0.1)
Basophils Relative: 1 % (ref 0–1)
EOS ABS: 0.1 10*3/uL (ref 0.0–0.7)
Eosinophils Relative: 1 % (ref 0–5)
HEMATOCRIT: 43.8 % (ref 36.0–46.0)
HEMOGLOBIN: 15.7 g/dL — AB (ref 12.0–15.0)
LYMPHS ABS: 2.7 10*3/uL (ref 0.7–4.0)
Lymphocytes Relative: 35 % (ref 12–46)
MCH: 28.4 pg (ref 26.0–34.0)
MCHC: 35.8 g/dL (ref 30.0–36.0)
MCV: 79.2 fL (ref 78.0–100.0)
MONO ABS: 0.6 10*3/uL (ref 0.1–1.0)
MONOS PCT: 8 % (ref 3–12)
NEUTROS PCT: 56 % (ref 43–77)
Neutro Abs: 4.3 10*3/uL (ref 1.7–7.7)
Platelets: 305 10*3/uL (ref 150–400)
RBC: 5.53 MIL/uL — AB (ref 3.87–5.11)
RDW: 14 % (ref 11.5–15.5)
WBC: 7.7 10*3/uL (ref 4.0–10.5)

## 2013-07-06 LAB — ETHANOL: Alcohol, Ethyl (B): <11 mg/dL (ref 0–11)

## 2013-07-06 LAB — COMPREHENSIVE METABOLIC PANEL
ALK PHOS: 66 U/L (ref 39–117)
ALT: 11 U/L (ref 0–35)
AST: 15 U/L (ref 0–37)
Albumin: 4.1 g/dL (ref 3.5–5.2)
BILIRUBIN TOTAL: 0.3 mg/dL (ref 0.3–1.2)
BUN: 9 mg/dL (ref 6–23)
CO2: 29 mEq/L (ref 19–32)
Calcium: 9.5 mg/dL (ref 8.4–10.5)
Chloride: 98 mEq/L (ref 96–112)
Creatinine, Ser: 0.43 mg/dL — ABNORMAL LOW (ref 0.50–1.10)
GFR calc non Af Amer: >90 mL/min (ref >90)
GLUCOSE: 144 mg/dL — AB (ref 70–99)
POTASSIUM: 3.6 meq/L — AB (ref 3.7–5.3)
Sodium: 140 mEq/L (ref 137–147)
TOTAL PROTEIN: 8.3 g/dL (ref 6.0–8.3)

## 2013-07-06 LAB — RAPID URINE DRUG SCREEN, HOSP PERFORMED
AMPHETAMINES: NOT DETECTED
BENZODIAZEPINES: NOT DETECTED
Barbiturates: NOT DETECTED
Cocaine: NOT DETECTED
OPIATES: NOT DETECTED
TETRAHYDROCANNABINOL: NOT DETECTED

## 2013-07-06 LAB — LACTIC ACID, PLASMA: LACTIC ACID, VENOUS: 0.9 mmol/L (ref 0.5–2.2)

## 2013-07-06 LAB — TROPONIN I: Troponin I: <0.3 ng/mL (ref <0.30)

## 2013-07-06 MED ORDER — SODIUM CHLORIDE 0.9 % IV SOLN
INTRAVENOUS | Status: DC
  Administered 2013-07-06: 19:00:00 via INTRAVENOUS

## 2013-07-06 MED ORDER — LEVETIRACETAM 500 MG/5ML IV SOLN
INTRAVENOUS | Status: AC
  Filled 2013-07-06: qty 5

## 2013-07-06 MED ORDER — SODIUM CHLORIDE 0.9 % IV SOLN
INTRAVENOUS | Status: DC
  Administered 2013-07-06 – 2013-07-08 (×2): via INTRAVENOUS

## 2013-07-06 MED ORDER — ACETAMINOPHEN 325 MG PO TABS: 650.0000 mg | ORAL_TABLET | Freq: Four times a day (QID) | ORAL | Status: DC | PRN

## 2013-07-06 MED ORDER — LORAZEPAM 2 MG/ML IJ SOLN
2.0000 mg | Freq: Once | INTRAMUSCULAR | Status: AC
  Administered 2013-07-06: 2 mg via INTRAVENOUS
  Filled 2013-07-06: qty 1

## 2013-07-06 MED ORDER — METOPROLOL TARTRATE 1 MG/ML IV SOLN
5.0000 mg | Freq: Four times a day (QID) | INTRAVENOUS | Status: DC
  Administered 2013-07-06 – 2013-07-08 (×7): 5 mg via INTRAVENOUS
  Filled 2013-07-06 (×7): qty 5

## 2013-07-06 MED ORDER — THIAMINE HCL 100 MG/ML IJ SOLN
100.0000 mg | Freq: Every day | INTRAMUSCULAR | Status: DC
  Administered 2013-07-07 – 2013-07-08 (×2): 100 mg via INTRAVENOUS
  Filled 2013-07-06 (×2): qty 2

## 2013-07-06 MED ORDER — SODIUM CHLORIDE 0.9 % IV SOLN
500.0000 mg | Freq: Two times a day (BID) | INTRAVENOUS | Status: DC
  Administered 2013-07-06 – 2013-07-07 (×3): 500 mg via INTRAVENOUS
  Filled 2013-07-06 (×4): qty 5

## 2013-07-06 MED ORDER — SODIUM CHLORIDE 0.9 % IJ SOLN
3.0000 mL | Freq: Two times a day (BID) | INTRAMUSCULAR | Status: DC
  Administered 2013-07-06: 10 mL via INTRAVENOUS
  Administered 2013-07-07 – 2013-07-08 (×3): 3 mL via INTRAVENOUS

## 2013-07-06 MED ORDER — ONDANSETRON HCL 4 MG PO TABS: 4.0000 mg | ORAL_TABLET | Freq: Four times a day (QID) | ORAL | Status: DC | PRN

## 2013-07-06 MED ORDER — ENOXAPARIN SODIUM 40 MG/0.4ML ~~LOC~~ SOLN
40.0000 mg | SUBCUTANEOUS | Status: DC
  Administered 2013-07-07 – 2013-07-10 (×4): 40 mg via SUBCUTANEOUS
  Filled 2013-07-06 (×4): qty 0.4

## 2013-07-06 MED ORDER — ONDANSETRON HCL 4 MG/2ML IJ SOLN
4.0000 mg | Freq: Four times a day (QID) | INTRAMUSCULAR | Status: DC | PRN
  Administered 2013-07-07: 4 mg via INTRAVENOUS
  Filled 2013-07-06: qty 2

## 2013-07-06 MED ORDER — LORAZEPAM 1 MG PO TABS
ORAL_TABLET | ORAL | Status: AC
  Filled 2013-07-06: qty 2

## 2013-07-06 MED ORDER — LORAZEPAM 1 MG PO TABS: 2.0000 mg | ORAL_TABLET | Freq: Once | ORAL | Status: DC

## 2013-07-06 MED ORDER — ACETAMINOPHEN 650 MG RE SUPP: 650.0000 mg | Freq: Four times a day (QID) | RECTAL | Status: DC | PRN

## 2013-07-06 NOTE — ED Notes (Signed)
Could not assess pt ability to swallow, pt did not know what to do with the straw.

## 2013-07-06 NOTE — ED Notes (Signed)
[facility], pt states her name when ask, will not answer any other questions. EMS not sure of baseline. BS in [address], Pt is wheelchair bound, hx of cva

## 2013-07-06 NOTE — ED Notes (Signed)
Patient is sleeping at this time . Equal rise sand fall of chest. No distress noted.

## 2013-07-06 NOTE — ED Notes (Signed)
Pt keeps repeating "come here" 2 nurse at the bedside

## 2013-07-06 NOTE — H&P (Signed)
[facility] History and Physical  Tami Nolan JOA:[id number] DOB: 04/04/1961 DOA: 07/06/2013   PCP: Rosita Fire, MD  Specialists: Unknown  Chief Complaint: Altered mental status  HPI: Tami Nolan is a 53 y.o. female with a past medical history of multiple sclerosis, seizure disorder who lives in a rest home. Patient is confused and actually was given a dose of Ativan just a little while ago, and, hence is not fully responsive. She is unable to provide any history whatsoever. Most of the history was obtained from emergency department notes and ED physician notes. Apparently, she was sent over here from her Rest Home because she had not been acting herself. She had been urinating a lot. She is wheelchair-bound at baseline and was noted to not move her right side as much as she was doing before. Having said that, her usual baseline is not known. She has been admitted a few times in the past with altered mental status. During previous hospitalization in March 2014, she was found to have seizure disorder, and was started on Keppra.  Home Medications: Prior to Admission medications   Medication Sig Start Date End Date Taking? Authorizing Provider  aspirin 325 MG EC tablet Take 325 mg by mouth daily.   Yes Historical Provider, MD  cyclobenzaprine (FLEXERIL) 10 MG tablet Take 1 tablet (10 mg total) by mouth 2 (two) times daily. 08/02/12  Yes Johnna Acosta, MD  dalfampridine (AMPYRA) 10 MG TB12 Take 10 mg by mouth 2 (two) times daily.   Yes Historical Provider, MD  levETIRAcetam (KEPPRA) 500 MG tablet Take 1 tablet (500 mg total) by mouth 2 (two) times daily. 08/27/12  Yes Rosita Fire, MD  loratadine (CLARITIN) 10 MG tablet Take 1 tablet (10 mg total) by mouth daily. 08/02/12  Yes Johnna Acosta, MD  metoprolol tartrate (LOPRESSOR) 25 MG tablet Take 1 tablet (25 mg total) by mouth 2 (two) times daily. 08/02/12  Yes Johnna Acosta, MD  montelukast (SINGULAIR) 10 MG tablet Take 1 tablet (10 mg  total) by mouth at bedtime. 08/02/12  Yes Johnna Acosta, MD  Multiple Vitamin (MULTIVITAMIN WITH MINERALS) TABS Take 1 tablet by mouth daily. 08/02/12  Yes Johnna Acosta, MD  simvastatin (ZOCOR) 40 MG tablet Take 1 tablet (40 mg total) by mouth at bedtime. 08/02/12  Yes Johnna Acosta, MD  traZODone (DESYREL) 100 MG tablet Take 200 mg by mouth at bedtime. 08/02/12  Yes Johnna Acosta, MD  venlafaxine XR (EFFEXOR-XR) 150 MG 24 hr capsule Take 1 capsule (150 mg total) by mouth daily. 08/02/12  Yes Johnna Acosta, MD  venlafaxine XR (EFFEXOR-XR) 75 MG 24 hr capsule Take 75 mg by mouth daily. Taken with $RemoveBe'150mg'[id number]$  capsule   Yes Historical Provider, MD  HYDROcodone-acetaminophen (NORCO/VICODIN) 5-325 MG per tablet Take 1 tablet by mouth every 4 (four) hours as needed. Pain 08/02/12   Johnna Acosta, MD  LORazepam (ATIVAN) 2 MG tablet Take 1 tablet (2 mg total) by mouth every 4 (four) hours as needed for anxiety. 08/02/12   Johnna Acosta, MD    Allergies: No Known Allergies  Past Medical History: Past Medical History  Diagnosis Date  . Hepatitis     ETOH related   . ETOH abuse   . Hx of abnormal cervical Pap smear 1997  . Colon polyps 1997  . Genital warts   . Multiple sclerosis   . Hypokalemia   . Rhabdomyolysis   . Anxiety   . Stroke   .  History of electroencephalogram 08/2012    normal    Past Surgical History  Procedure Laterality Date  . Bilatetral tubal ligation      Social History: Patient lives in a rest home. Rest of the social history is unobtainable at this time due to her mental status.  Family History:  Family History  Problem Relation Age of Onset  . Coronary artery disease Brother   . Diabetes Brother   . Hypertension Brother   . Hypertension Mother   . Diabetes Mother   . Hypertension Sister   . Alcohol abuse      family history      Review of Systems - unable to obtain due to mental status changes  Physical Examination  Filed Vitals:   07/06/13 1900 07/06/13  1933 07/06/13 2000 07/06/13 2057  BP: 185/101 215/140 160/140 133/100  Pulse: 120 132 122 128  Temp:  96.4 F (35.8 C)    TempSrc:  Rectal    Resp: $Remo'26  20 18  'CtMKu$ Height:      Weight:      SpO2: 95% 98% 96% 94%    General appearance: Somnolent. Opens eyes occasionally, but doesn't really answer questions. Does not appear to be in any distress Head: Normocephalic, without obvious abnormality, atraumatic Eyes: Pupils equal, reacting to light. No pallor. No icterus. Extraocular movements could not be tested Neck: no adenopathy, no carotid bruit, no JVD, supple, symmetrical, trachea midline and thyroid not enlarged, symmetric, no tenderness/mass/nodules Resp: clear to auscultation bilaterally Cardio: S1-S2 is tachycardic. Regular. No S3, S4. No rubs, murmurs, or bruits. No pedal edema GI: soft, non-tender; bowel sounds normal; no masses,  no organomegaly Extremities: extremities normal, atraumatic, no cyanosis or edema Skin: Skin color, texture, turgor normal. No rashes or lesions Neurologic: She is somnolent. Barely arousable. Some movement of the left side is noted, but not so much of the right. However, plantars are downgoing bilaterally. No obvious facial droop is noted.  Laboratory Data: Results for orders placed during the hospital encounter of 07/06/13 (from the past 48 hour(s))  CBC WITH DIFFERENTIAL     Status: Abnormal   Collection Time    07/06/13  7:13 PM      Result Value Range   WBC 7.7  4.0 - 10.5 K/uL   RBC 5.53 (*) 3.87 - 5.11 MIL/uL   Hemoglobin 15.7 (*) 12.0 - 15.0 g/dL   HCT 43.8  36.0 - 46.0 %   MCV 79.2  78.0 - 100.0 fL   MCH 28.4  26.0 - 34.0 pg   MCHC 35.8  30.0 - 36.0 g/dL   RDW 14.0  11.5 - 15.5 %   Platelets 305  150 - 400 K/uL   Neutrophils Relative % 56  43 - 77 %   Neutro Abs 4.3  1.7 - 7.7 K/uL   Lymphocytes Relative 35  12 - 46 %   Lymphs Abs 2.7  0.7 - 4.0 K/uL   Monocytes Relative 8  3 - 12 %   Monocytes Absolute 0.6  0.1 - 1.0 K/uL   Eosinophils  Relative 1  0 - 5 %   Eosinophils Absolute 0.1  0.0 - 0.7 K/uL   Basophils Relative 1  0 - 1 %   Basophils Absolute 0.0  0.0 - 0.1 K/uL  COMPREHENSIVE METABOLIC PANEL     Status: Abnormal   Collection Time    07/06/13  7:13 PM      Result Value Range   Sodium 140  137 - 147 mEq/L   Potassium 3.6 (*) 3.7 - 5.3 mEq/L   Chloride 98  96 - 112 mEq/L   CO2 29  19 - 32 mEq/L   Glucose, Bld 144 (*) 70 - 99 mg/dL   BUN 9  6 - 23 mg/dL   Creatinine, Ser 0.43 (*) 0.50 - 1.10 mg/dL   Calcium 9.5  8.4 - 10.5 mg/dL   Total Protein 8.3  6.0 - 8.3 g/dL   Albumin 4.1  3.5 - 5.2 g/dL   AST 15  0 - 37 U/L   ALT 11  0 - 35 U/L   Alkaline Phosphatase 66  39 - 117 U/L   Total Bilirubin 0.3  0.3 - 1.2 mg/dL   GFR calc non Af Amer >90  >90 mL/min   GFR calc Af Amer >90  >90 mL/min   Comment: (NOTE)     The eGFR has been calculated using the CKD EPI equation.     This calculation has not been validated in all clinical situations.     eGFR's persistently <90 mL/min signify possible Chronic Kidney     Disease.  ETHANOL     Status: None   Collection Time    07/06/13  7:13 PM      Result Value Range   Alcohol, Ethyl (B) <11  0 - 11 mg/dL   Comment:            LOWEST DETECTABLE LIMIT FOR     SERUM ALCOHOL IS 11 mg/dL     FOR MEDICAL PURPOSES ONLY  TROPONIN I     Status: None   Collection Time    07/06/13  7:13 PM      Result Value Range   Troponin I <0.30  <0.30 ng/mL   Comment:            Due to the release kinetics of cTnI,     a negative result within the first hours     of the onset of symptoms does not rule out     myocardial infarction with certainty.     If myocardial infarction is still suspected,     repeat the test at appropriate intervals.  LACTIC ACID, PLASMA     Status: None   Collection Time    07/06/13  7:14 PM      Result Value Range   Lactic Acid, Venous 0.9  0.5 - 2.2 mmol/L  URINALYSIS W MICROSCOPIC + REFLEX CULTURE     Status: Abnormal   Collection Time    07/06/13   7:33 PM      Result Value Range   Color, Urine YELLOW  YELLOW   APPearance CLEAR  CLEAR   Specific Gravity, Urine 1.015  1.005 - 1.030   pH 6.0  5.0 - 8.0   Glucose, UA NEGATIVE  NEGATIVE mg/dL   Hgb urine dipstick MODERATE (*) NEGATIVE   Bilirubin Urine NEGATIVE  NEGATIVE   Ketones, ur TRACE (*) NEGATIVE mg/dL   Protein, ur NEGATIVE  NEGATIVE mg/dL   Urobilinogen, UA 0.2  0.0 - 1.0 mg/dL   Nitrite NEGATIVE  NEGATIVE   Leukocytes, UA NEGATIVE  NEGATIVE   WBC, UA 0-2  <3 WBC/hpf   RBC / HPF 3-6  <3 RBC/hpf  URINE RAPID DRUG SCREEN (HOSP PERFORMED)     Status: None   Collection Time    07/06/13  7:33 PM      Result Value Range   Opiates NONE DETECTED  NONE  DETECTED   Cocaine NONE DETECTED  NONE DETECTED   Benzodiazepines NONE DETECTED  NONE DETECTED   Amphetamines NONE DETECTED  NONE DETECTED   Tetrahydrocannabinol NONE DETECTED  NONE DETECTED   Barbiturates NONE DETECTED  NONE DETECTED   Comment:            DRUG SCREEN FOR MEDICAL PURPOSES     ONLY.  IF CONFIRMATION IS NEEDED     FOR ANY PURPOSE, NOTIFY LAB     WITHIN 5 DAYS.                LOWEST DETECTABLE LIMITS     FOR URINE DRUG SCREEN     Drug Class       Cutoff (ng/mL)     Amphetamine      1000     Barbiturate      200     Benzodiazepine   833     Tricyclics       825     Opiates          300     Cocaine          300     THC              50    Radiology Reports: Dg Chest 1 View  07/06/2013   CLINICAL DATA:  Altered mental status.  EXAM: CHEST - 1 VIEW  COMPARISON:  Single view of the chest 12/26/2012.  FINDINGS: There is some discoid atelectasis in the left lung base. Mild subsegmental atelectasis is also seen in the right base. No consolidative process, pneumothorax or effusion. Heart size is normal.  IMPRESSION: No acute finding.   Electronically Signed   By: Inge Rise M.D.   On: 07/06/2013 20:34   Ct Head Wo Contrast  07/06/2013   CLINICAL DATA:  Altered mental status  EXAM: CT HEAD WITHOUT CONTRAST   TECHNIQUE: Contiguous axial images were obtained from the base of the skull through the vertex without intravenous contrast.  COMPARISON:  02/06/2013  FINDINGS: Calvarium is intact. There is significant diffuse atrophy and low attenuation in the deep white matter most consistent with chronic small vessel ischemic change. There is no evidence of vascular territory infarct. There is no evidence of mass. There is no hemorrhage or extra-axial fluid. There is no hydrocephalus.  IMPRESSION: Chronic degenerative changes.  No acute findings.   Electronically Signed   By: Skipper Cliche M.D.   On: 07/06/2013 20:10    Electrocardiogram: Sinus tachycardia at 123 beats per minute. Normal axis. Intervals are normal. Possible Q waves in V1, V2. No concerning ST or T-wave changes are noted. Similar to old EKG.  Problem List  Principal Problem:   Acute encephalopathy Active Problems:   History of multiple sclerosis   Seizure disorder   Sinus tachycardia   Assessment: This is a 53 year old, African American female, with a history of multiple sclerosis, lives in a rest home and is brought in for altered mental status. Duration of her symptomatology is unclear. Differential diagnosis is broad. However, there is no evidence for overt infection. Lactic acid level was normal. She could have had seizure activity, but this also remains unclear. Examination is nonfocal, but her right side is not moving as much as before, but it's unclear if this is old or new.  Plan: #1 acute encephalopathy: We will place her in step down unit. We will check ABG. CT of the head does not show any acute finding. So,  we will proceed with MRI brain. We will consult neurology. We will give her Keppra intravenously. It's possible she may not have taken any of her home medications at least in the last day or so.  #2 sinus tachycardia: This could be because she may not have taken her beta blocker in last few days. EKG does not show any acute  findings. Since she is hypertensive as well we will give her doses of beta blocker intravenously. Will check TSH level. Monitor her on telemetry for now.  #3 history of seizure disorder: Keppra intravenously for now. No overt seizure activity has been noted. EEG can be considered.  #4 history of multiple sclerosis: Management per neurology. Hold Ampyra for now.  DVT Prophylaxis: Lovenox Code Status: Presumed full code Family Communication: No family at bedside  Disposition Plan: Admit to step down. Dr. Legrand Rams to assume care in the morning.   Further management decisions will depend on results of further testing and patient's response to treatment.  [facility]  [facility] Pager [phone]  If 7PM-7AM, please contact night-coverage [email or web address] Password [facility]  07/06/2013, 9:17 PM

## 2013-07-06 NOTE — ED Notes (Signed)
Family on the way, [facility], stated pt has been urinating a lot more than normal, not using right hand and talk out of her head, Hx of UTI, with confusion.

## 2013-07-06 NOTE — ED Notes (Signed)
Tech assisted with In and out, pt tolerated well.

## 2013-07-06 NOTE — ED Provider Notes (Signed)
CSN: [id number]     Arrival date & time 07/06/13  1825 History   First MD Initiated Contact with Patient 07/06/13 1843     Chief Complaint  Patient presents with  . Altered Mental Status    Patient is a 53 y.o. female presenting with altered mental status. The history is provided by the EMS personnel and the nursing home. The history is limited by the condition of the patient (AMS).  Altered Mental Status Pt was seen at 1855. Per EMS and NH report, pt with AMS for unknown period of time. NH states pt has been "talking out of her head" and "not using her right hand," as well as "urinating more than usual." NH told EMS pt often "acts like this when she has a UTI."  Pt is wheelchair bound by hx MS. No reported fevers, N/V/D, falls.     Past Medical History  Diagnosis Date  . Hepatitis     ETOH related   . ETOH abuse   . Hx of abnormal cervical Pap smear 1997  . Colon polyps 1997  . Genital warts   . Multiple sclerosis   . Hypokalemia   . Rhabdomyolysis   . Anxiety   . Stroke   . History of electroencephalogram 08/2012    normal   Past Surgical History  Procedure Laterality Date  . Bilatetral tubal ligation     Family History  Problem Relation Age of Onset  . Coronary artery disease Brother   . Diabetes Brother   . Hypertension Brother   . Hypertension Mother   . Diabetes Mother   . Hypertension Sister   . Alcohol abuse      family history    History  Substance Use Topics  . Smoking status: Current Every Day Smoker -- 1.00 packs/day for 15 years    Types: Cigarettes  . Smokeless tobacco: Current User  . Alcohol Use: No     Comment: Hx of ETOH abuse - dry since 1997.    Review of Systems  Unable to perform ROS: Mental status change    Allergies  Review of patient's allergies indicates no known allergies.  Home Medications   Current Outpatient Rx  Name  Route  Sig  Dispense  Refill  . aspirin 325 MG EC tablet   Oral   Take 325 mg by mouth daily.         .  cyclobenzaprine (FLEXERIL) 10 MG tablet   Oral   Take 1 tablet (10 mg total) by mouth 2 (two) times daily.   30 tablet   1   . dalfampridine (AMPYRA) 10 MG TB12   Oral   Take 10 mg by mouth 2 (two) times daily.         Marland Kitchen. levETIRAcetam (KEPPRA) 500 MG tablet   Oral   Take 1 tablet (500 mg total) by mouth 2 (two) times daily.   60 tablet   1   . loratadine (CLARITIN) 10 MG tablet   Oral   Take 1 tablet (10 mg total) by mouth daily.   30 tablet   1   . metoprolol tartrate (LOPRESSOR) 25 MG tablet   Oral   Take 1 tablet (25 mg total) by mouth 2 (two) times daily.   60 tablet   1   . montelukast (SINGULAIR) 10 MG tablet   Oral   Take 1 tablet (10 mg total) by mouth at bedtime.   30 tablet   1   . Multiple  Vitamin (MULTIVITAMIN WITH MINERALS) TABS   Oral   Take 1 tablet by mouth daily.   30 tablet   1   . simvastatin (ZOCOR) 40 MG tablet   Oral   Take 1 tablet (40 mg total) by mouth at bedtime.   30 tablet   1   . traZODone (DESYREL) 100 MG tablet   Oral   Take 200 mg by mouth at bedtime.         Marland Kitchen venlafaxine XR (EFFEXOR-XR) 150 MG 24 hr capsule   Oral   Take 1 capsule (150 mg total) by mouth daily.   30 capsule   1   . venlafaxine XR (EFFEXOR-XR) 75 MG 24 hr capsule   Oral   Take 75 mg by mouth daily. Taken with 150mg  capsule         . HYDROcodone-acetaminophen (NORCO/VICODIN) 5-325 MG per tablet   Oral   Take 1 tablet by mouth every 4 (four) hours as needed. Pain   30 tablet   0   . LORazepam (ATIVAN) 2 MG tablet   Oral   Take 1 tablet (2 mg total) by mouth every 4 (four) hours as needed for anxiety.   30 tablet   0    BP 133/100  Pulse 128  Temp(Src) 96.4 F (35.8 C) (Rectal)  Resp 18  Ht 5\' 7"  (1.702 m)  Wt 210 lb (95.255 kg)  BMI 32.88 kg/m2  SpO2 94% Physical Exam 1900: Physical examination:  Nursing notes reviewed; Vital signs and O2 SAT reviewed;  Constitutional: Well developed, Well nourished, In no acute distress; Head:   Normocephalic, atraumatic; Eyes: EOMI, PERRL, No scleral icterus; ENMT: Mouth and pharynx normal, Mucous membranes dry; Neck: Supple, Full range of motion, No lymphadenopathy; Cardiovascular: Tachycardic rate and rhythm, No gallop; Respiratory: Breath sounds clear & equal bilaterally, No wheezes. Normal respiratory effort/excursion; Chest: Nontender, Movement normal; Abdomen: Soft, Nontender, Nondistended, Normal bowel sounds; Genitourinary: No CVA tenderness; Extremities: Pulses normal, No tenderness, No edema, No calf edema or asymmetry.; Neuro: Awake, alert, eyes open and will track ED staff around exam room. Will shout "come here" in clear voice to anyone in the hallway passing by her room.  Pt will move her LUE spontaneously and hold it up in off stretcher, she is not moving her RUE, and it drops to the stretcher when lifted. Pt has minimal use of her bilat LE's due to hx MS.; Skin: Color normal, Warm, Dry.   ED Course  Procedures   EKG Interpretation    Date/Time:  Sunday July 06 2013 18:49:34 EST Ventricular Rate:  123 PR Interval:  148 QRS Duration: 78 QT Interval:  310 QTC Calculation: 443 R Axis:   27 Text Interpretation:  Sinus tachycardia Artifact Possible Left atrial enlargement Septal infarct (cited on or before 18-May-2006) Abnormal ECG When compared with ECG of 26-Dec-2012 07:29, No significant change was found Confirmed by [facility]  MD, Nicholos Johns [phone]) on 07/06/2013 7:29:08 PM            MDM  MDM Reviewed: previous chart, nursing note and vitals Reviewed previous: labs, ECG and MRI Interpretation: labs, ECG, x-ray and CT scan     Results for orders placed during the hospital encounter of 07/06/13  URINALYSIS W MICROSCOPIC + REFLEX CULTURE      Result Value Range   Color, Urine YELLOW  YELLOW   APPearance CLEAR  CLEAR   Specific Gravity, Urine 1.015  1.005 - 1.030   pH 6.0  5.0 -  8.0   Glucose, UA NEGATIVE  NEGATIVE mg/dL   Hgb urine dipstick MODERATE (*)  NEGATIVE   Bilirubin Urine NEGATIVE  NEGATIVE   Ketones, ur TRACE (*) NEGATIVE mg/dL   Protein, ur NEGATIVE  NEGATIVE mg/dL   Urobilinogen, UA 0.2  0.0 - 1.0 mg/dL   Nitrite NEGATIVE  NEGATIVE   Leukocytes, UA NEGATIVE  NEGATIVE   WBC, UA 0-2  <3 WBC/hpf   RBC / HPF 3-6  <3 RBC/hpf  CBC WITH DIFFERENTIAL      Result Value Range   WBC 7.7  4.0 - 10.5 K/uL   RBC 5.53 (*) 3.87 - 5.11 MIL/uL   Hemoglobin 15.7 (*) 12.0 - 15.0 g/dL   HCT 16.1  09.6 - 04.5 %   MCV 79.2  78.0 - 100.0 fL   MCH 28.4  26.0 - 34.0 pg   MCHC 35.8  30.0 - 36.0 g/dL   RDW 40.9  81.1 - 91.4 %   Platelets 305  150 - 400 K/uL   Neutrophils Relative % 56  43 - 77 %   Neutro Abs 4.3  1.7 - 7.7 K/uL   Lymphocytes Relative 35  12 - 46 %   Lymphs Abs 2.7  0.7 - 4.0 K/uL   Monocytes Relative 8  3 - 12 %   Monocytes Absolute 0.6  0.1 - 1.0 K/uL   Eosinophils Relative 1  0 - 5 %   Eosinophils Absolute 0.1  0.0 - 0.7 K/uL   Basophils Relative 1  0 - 1 %   Basophils Absolute 0.0  0.0 - 0.1 K/uL  COMPREHENSIVE METABOLIC PANEL      Result Value Range   Sodium 140  137 - 147 mEq/L   Potassium 3.6 (*) 3.7 - 5.3 mEq/L   Chloride 98  96 - 112 mEq/L   CO2 29  19 - 32 mEq/L   Glucose, Bld 144 (*) 70 - 99 mg/dL   BUN 9  6 - 23 mg/dL   Creatinine, Ser 7.82 (*) 0.50 - 1.10 mg/dL   Calcium 9.5  8.4 - 95.6 mg/dL   Total Protein 8.3  6.0 - 8.3 g/dL   Albumin 4.1  3.5 - 5.2 g/dL   AST 15  0 - 37 U/L   ALT 11  0 - 35 U/L   Alkaline Phosphatase 66  39 - 117 U/L   Total Bilirubin 0.3  0.3 - 1.2 mg/dL   GFR calc non Af Amer >90  >90 mL/min   GFR calc Af Amer >90  >90 mL/min  LACTIC ACID, PLASMA      Result Value Range   Lactic Acid, Venous 0.9  0.5 - 2.2 mmol/L  URINE RAPID DRUG SCREEN (HOSP PERFORMED)      Result Value Range   Opiates NONE DETECTED  NONE DETECTED   Cocaine NONE DETECTED  NONE DETECTED   Benzodiazepines NONE DETECTED  NONE DETECTED   Amphetamines NONE DETECTED  NONE DETECTED   Tetrahydrocannabinol NONE  DETECTED  NONE DETECTED   Barbiturates NONE DETECTED  NONE DETECTED  ETHANOL      Result Value Range   Alcohol, Ethyl (B) <11  0 - 11 mg/dL  TROPONIN I      Result Value Range   Troponin I <0.30  <0.30 ng/mL   Dg Chest 1 View 07/06/2013   CLINICAL DATA:  Altered mental status.  EXAM: CHEST - 1 VIEW  COMPARISON:  Single view of the chest 12/26/2012.  FINDINGS: There is  some discoid atelectasis in the left lung base. Mild subsegmental atelectasis is also seen in the right base. No consolidative process, pneumothorax or effusion. Heart size is normal.  IMPRESSION: No acute finding.   Electronically Signed   By: Drusilla Kanner M.D.   On: 07/06/2013 20:34   Ct Head Wo Contrast 07/06/2013   CLINICAL DATA:  Altered mental status  EXAM: CT HEAD WITHOUT CONTRAST  TECHNIQUE: Contiguous axial images were obtained from the base of the skull through the vertex without intravenous contrast.  COMPARISON:  02/06/2013  FINDINGS: Calvarium is intact. There is significant diffuse atrophy and low attenuation in the deep white matter most consistent with chronic small vessel ischemic change. There is no evidence of vascular territory infarct. There is no evidence of mass. There is no hemorrhage or extra-axial fluid. There is no hydrocephalus.  IMPRESSION: Chronic degenerative changes.  No acute findings.   Electronically Signed   By: Esperanza Heir M.D.   On: 07/06/2013 20:10    2055:  Pt initially awake/alert, tracking ED staff's movements around the exam room, but would not speak other than to say her name or "come here" to anyone in the hallway. Pt initially calm and cooperative, then began agitated, yelling and cursing. ED staff unable to perform workup due to her agitation. Ativan given per her NH med list with good effect. Remains tachycardic after ativan. Afebrile on rectal temp. Question if pt has been taking her meds at the [facility].  T/C to [facility] Dr. Rito Ehrlich, case discussed, including:  HPI, pertinent PM/SHx, VS/PE,  dx testing, ED course and treatment:  Agreeable to admit, requests he will come to ED for eval.    Laray Anger, DO 07/07/13 1952

## 2013-07-07 ENCOUNTER — Inpatient Hospital Stay (HOSPITAL_COMMUNITY): Payer: Medicare Other

## 2013-07-07 ENCOUNTER — Other Ambulatory Visit (HOSPITAL_COMMUNITY): Payer: Medicare Other

## 2013-07-07 DIAGNOSIS — G9340 Encephalopathy, unspecified: Secondary | ICD-10-CM | POA: Diagnosis not present

## 2013-07-07 DIAGNOSIS — R4182 Altered mental status, unspecified: Secondary | ICD-10-CM | POA: Diagnosis not present

## 2013-07-07 DIAGNOSIS — R569 Unspecified convulsions: Secondary | ICD-10-CM | POA: Diagnosis not present

## 2013-07-07 DIAGNOSIS — I10 Essential (primary) hypertension: Secondary | ICD-10-CM | POA: Diagnosis not present

## 2013-07-07 DIAGNOSIS — G35 Multiple sclerosis: Secondary | ICD-10-CM | POA: Diagnosis not present

## 2013-07-07 LAB — COMPREHENSIVE METABOLIC PANEL
ALT: 11 U/L (ref 0–35)
AST: 13 U/L (ref 0–37)
Albumin: 4 g/dL (ref 3.5–5.2)
Alkaline Phosphatase: 66 U/L (ref 39–117)
BUN: 7 mg/dL (ref 6–23)
CO2: 27 mEq/L (ref 19–32)
Calcium: 9.3 mg/dL (ref 8.4–10.5)
Chloride: 96 mEq/L (ref 96–112)
Creatinine, Ser: 0.41 mg/dL — ABNORMAL LOW (ref 0.50–1.10)
GFR calc Af Amer: >90 mL/min (ref >90)
GFR calc non Af Amer: >90 mL/min (ref >90)
Glucose, Bld: 162 mg/dL — ABNORMAL HIGH (ref 70–99)
Potassium: 3.7 mEq/L (ref 3.7–5.3)
Sodium: 138 mEq/L (ref 137–147)
Total Bilirubin: 0.3 mg/dL (ref 0.3–1.2)
Total Protein: 8.3 g/dL (ref 6.0–8.3)

## 2013-07-07 LAB — CBC
HEMATOCRIT: 43.9 % (ref 36.0–46.0)
HEMOGLOBIN: 15.5 g/dL — AB (ref 12.0–15.0)
MCH: 27.6 pg (ref 26.0–34.0)
MCHC: 35.3 g/dL (ref 30.0–36.0)
MCV: 78.1 fL (ref 78.0–100.0)
Platelets: 318 10*3/uL (ref 150–400)
RBC: 5.62 MIL/uL — AB (ref 3.87–5.11)
RDW: 13.9 % (ref 11.5–15.5)
WBC: 8 10*3/uL (ref 4.0–10.5)

## 2013-07-07 LAB — MRSA PCR SCREENING: MRSA by PCR: NEGATIVE

## 2013-07-07 LAB — TSH: TSH: 0.327 u[IU]/mL — AB (ref 0.350–4.500)

## 2013-07-07 MED ORDER — LORAZEPAM 2 MG/ML IJ SOLN
1.0000 mg | INTRAMUSCULAR | Status: DC | PRN
  Administered 2013-07-07 – 2013-07-09 (×3): 1 mg via INTRAVENOUS
  Filled 2013-07-07 (×3): qty 1

## 2013-07-07 MED ORDER — HYDRALAZINE HCL 20 MG/ML IJ SOLN
5.0000 mg | Freq: Once | INTRAMUSCULAR | Status: AC
  Administered 2013-07-07: 5 mg via INTRAVENOUS
  Filled 2013-07-07: qty 1

## 2013-07-07 MED ORDER — LORAZEPAM 2 MG/ML IJ SOLN
1.0000 mg | Freq: Once | INTRAMUSCULAR | Status: AC
  Administered 2013-07-07: 1 mg via INTRAVENOUS
  Filled 2013-07-07: qty 1

## 2013-07-07 MED ORDER — NITROGLYCERIN 2 % TD OINT
1.0000 [in_us] | TOPICAL_OINTMENT | Freq: Four times a day (QID) | TRANSDERMAL | Status: DC
  Administered 2013-07-07 – 2013-07-10 (×10): 1 [in_us] via TOPICAL
  Filled 2013-07-07 (×12): qty 1

## 2013-07-07 MED ORDER — HALOPERIDOL LACTATE 5 MG/ML IJ SOLN
5.0000 mg | Freq: Once | INTRAMUSCULAR | Status: AC
  Administered 2013-07-07: 5 mg via INTRAMUSCULAR
  Filled 2013-07-07: qty 1

## 2013-07-07 MED ORDER — HYDRALAZINE HCL 20 MG/ML IJ SOLN
10.0000 mg | Freq: Four times a day (QID) | INTRAMUSCULAR | Status: DC | PRN
  Administered 2013-07-07 – 2013-07-08 (×6): 10 mg via INTRAVENOUS
  Filled 2013-07-07 (×7): qty 1

## 2013-07-07 NOTE — Progress Notes (Signed)
Spoke with dr. Felecia Shelling regarding pt not sitting still for the  Mri.  Dr. Felecia Shelling wants dr. Gerilyn Pilgrim to be notified of situation.

## 2013-07-07 NOTE — Progress Notes (Signed)
Spoke with dr. Gerilyn Pilgrim regarding mri and pt sitting still for procedure.  Orders received and wants to monitor during procedure.

## 2013-07-07 NOTE — Progress Notes (Signed)
PT CONTINUES TO BE COMBATIVE.  PULLED IV TUBING APART AND TRYING TO PULL IV POLE OVER.  HAS PAPER IN ROOM AND TEARING PAPER APART AND THROWING IN FLOOR.

## 2013-07-07 NOTE — Progress Notes (Signed)
PT CONTINUES TO  BE UNCOOPERATIVE.  UNABLE TO GET PT DOWN TO RADIOLOGY FOR MRI.  UNABLE TO GET EEG.  NOTIFYING DR. Gerilyn PilgrimONQUAH.

## 2013-07-07 NOTE — Progress Notes (Signed)
Spoke with dr. Felecia Shellingfanta .  Notified of pt being uncooperative.  Orders received.  Tried to call dr. Ronal Fearoonquah's office twice and tried to page dr. Gerilyn Pilgrimoonquah twice to notify of being unable to do eeg and mri.

## 2013-07-07 NOTE — Progress Notes (Signed)
repaged dr. Gerilyn Pilgrimoonquah.  Pt continues to sit up on side of bed.  Not cooperative.

## 2013-07-07 NOTE — Progress Notes (Signed)
Subjective: Patient was admitted yesterday due to change in mental status. No fever or chills. She has history of seizure disorder.  Objective: Vital signs in last 24 hours: Temp:  [96.4 F (35.8 C)-99.8 F (37.7 C)] 98.3 F (36.8 C) (02/02 0500) Pulse Rate:  [103-138] 119 (02/02 0630) Resp:  [8-26] 26 (02/02 0630) BP: (133-215)/(98-140) 163/102 mmHg (02/02 0630) SpO2:  [89 %-98 %] 94 % (02/02 0630) Weight:  [91.5 kg (201 lb 11.5 oz)-95.255 kg (210 lb)] 91.8 kg (202 lb 6.1 oz) (02/02 0500) Weight change:  Last BM Date:  (unknown)  Intake/Output from previous day: 02/01 0701 - 02/02 0700 In: 1243.3 [I.V.:1138.3; IV Piggyback:105] Out: -   PHYSICAL EXAM General appearance: slowed mentation Resp: diminished breath sounds bilaterally and rhonchi bilaterally Cardio: S1, S2 normal GI: soft, non-tender; bowel sounds normal; no masses,  no organomegaly Extremities: extremities normal, atraumatic, no cyanosis or edema  Lab Results:    @labtest @ [facility]  Recent Labs  07/06/13 2335  PHART 7.335*  PO2ART 89.1  TCO2 24.2  HCO3 27.6*   CULTURES Recent Results (from the past 240 hour(s))  MRSA PCR SCREENING     Status: None   Collection Time    07/06/13 11:16 PM      Result Value Range Status   MRSA by PCR NEGATIVE  NEGATIVE Final   Comment:            The GeneXpert MRSA Assay (FDA     approved for NASAL specimens     only), is one component of a     comprehensive MRSA colonization     surveillance program. It is not     intended to diagnose MRSA     infection nor to guide or     monitor treatment for     MRSA infections.   Studies/Results: Dg Chest 1 View  07/06/2013   CLINICAL DATA:  Altered mental status.  EXAM: CHEST - 1 VIEW  COMPARISON:  Single view of the chest 12/26/2012.  FINDINGS: There is some discoid atelectasis in the left lung base. Mild subsegmental atelectasis is also seen in the right base. No consolidative process, pneumothorax or effusion. Heart size is  normal.  IMPRESSION: No acute finding.   Electronically Signed   By: Drusilla Kannerhomas  Dalessio M.D.   On: 07/06/2013 20:34   Ct Head Wo Contrast  07/06/2013   CLINICAL DATA:  Altered mental status  EXAM: CT HEAD WITHOUT CONTRAST  TECHNIQUE: Contiguous axial images were obtained from the base of the skull through the vertex without intravenous contrast.  COMPARISON:  02/06/2013  FINDINGS: Calvarium is intact. There is significant diffuse atrophy and low attenuation in the deep white matter most consistent with chronic small vessel ischemic change. There is no evidence of vascular territory infarct. There is no evidence of mass. There is no hemorrhage or extra-axial fluid. There is no hydrocephalus.  IMPRESSION: Chronic degenerative changes.  No acute findings.   Electronically Signed   By: Esperanza Heiraymond  Rubner M.D.   On: 07/06/2013 20:10    Medications: I have reviewed the patient's current medications.  Assesment: Principal Problem:   Acute encephalopathy Active Problems:   History of multiple sclerosis   Seizure disorder   Sinus tachycardia    Plan: Medications reviewed Will advance diet as tolerated Neurology consult Supportive care.    LOS: 1 day   Infantof Villagomez 07/07/2013, 7:58 AM

## 2013-07-07 NOTE — Clinical Social Work Psychosocial (Signed)
Clinical Social Work Department BRIEF PSYCHOSOCIAL ASSESSMENT 07/07/2013  Patient:  Tami Nolan, Tami Nolan     Account Number:  [id number]     Admit date:  07/06/2013  Clinical Social Worker:  Wyatt Haste  Date/Time:  07/07/2013 11:18 AM  Referred by:  CSW  Date Referred:  07/07/2013 Referred for  ALF Placement   Other Referral:   Interview type:  Patient Other interview type:   sister- Tami Nolan    PSYCHOSOCIAL DATA Living Status:  FACILITY Admitted from facility:  OTHER Level of care:  Assisted Living Primary support name:  Tami Nolan Primary support relationship to patient:  FAMILY Degree of support available:   supportive    CURRENT CONCERNS Current Concerns  Post-Acute Placement   Other Concerns:    SOCIAL WORK ASSESSMENT / PLAN CSW met with pt and pt's sister, Tami Nolan at bedside. Pt alert, but confused. Known to CSW from previous admission. Pt was placed at [facility] in March 2014. This facility is run by her niece, Tami Nolan. Tami Nolan also assists there as needed and is an Therapist, sports. No named HCPOA. Pt has two other siblings, but Tami Nolan states she is most involved. She reports things had been going fairly well with pt until they noticed the other day she was unable to speak and was shaking. Pt came to ED by EMS. Tami Nolan states pt does have some confusion at baseline. She always says she is going to move and live on her own even though this is not realistic. It frustrates pt to have to depend on others for care. She primarily uses a wheelchair and requires assist to transfer most of the time. Okay for return to [facility] at this point per [place]. CSW also left voicemail for Tami Nolan, but no return call yet.   Assessment/plan status:  Psychosocial Support/Ongoing Assessment of Needs Other assessment/ plan:   Information/referral to community resources:   [facility] [place]    PATIENT'S/FAMILY'S RESPONSE TO PLAN OF CARE: Pt sitting in bed and saying "Let's go" to [place]. Speech is still garbled.  Awaiting MRI. CSW will continue to follow. Plan is to return to [facility] if appropriate when medically stable.      Benay Pike, [place]

## 2013-07-07 NOTE — Care Management Note (Signed)
    Page 1 of 1   07/07/2013     2:46:39 PM   CARE MANAGEMENT NOTE 07/07/2013  Patient:  Tami Nolan, Tami Nolan   Account Number:  [id number]  Date Initiated:  07/07/2013  Documentation initiated by:  Sharrie Rothman  Subjective/Objective Assessment:   Pt admitted from [facility]F with encephalopathy. Pt will return to facility when medically stable.     Action/Plan:   CSW to arrange discharge to facility when medically stable.   Anticipated DC Date:  07/10/2013   Anticipated DC Plan:  ASSISTED LIVING / REST HOME  In-house referral  Clinical Social Worker      DC Planning Services  CM consult      Choice offered to / List presented to:             Status of service:  Completed, signed off Medicare Important Message given?   (If response is "NO", the following Medicare IM given date fields will be blank) Date Medicare IM given:   Date Additional Medicare IM given:    Discharge Disposition:  ASSISTED LIVING  Per UR Regulation:    If discussed at Long Length of Stay Meetings, dates discussed:    Comments:  07/07/13 1445 Arlyss Queen, RN BSN CM

## 2013-07-07 NOTE — Progress Notes (Addendum)
Spoke with dr. Gerilyn Pilgrim.  Notified of pt being combative and uncooperative.  Also notified unable to get eeg and mri due to pt not cooperating.  Orders received to give haldol and try mri again,  If unable to obtain then  repeat head ct.

## 2013-07-07 NOTE — Progress Notes (Signed)
UR chart review completed.  

## 2013-07-07 NOTE — Progress Notes (Signed)
Unable to perform EEG due to patient biting and smacking at anyone coming into room. Patient was sitting at end of bed tearing paper up. Nurse stated it would not be able to be done and she would let Dr. Gerilyn Pilgrimoonquah know. Tech told the nurse that she would be back tomorrow afternoon to try again.

## 2013-07-07 NOTE — Progress Notes (Signed)
PT COMBATIVE ,  SCRATCHING AND TRYING TO BITE STAFF.  CURSING.  PULLED GOWN OFF AND MONITOR.

## 2013-07-07 NOTE — Consult Note (Signed)
[place] A. Merlene Laughter, MD     [email or web address]          Tami Nolan is an 53 y.o. female.   ASSESSMENT/PLAN: 1. Acute encephalopathy/altered mental status. The acute presentation along with marked aphasia and dysarthria suggests an acute infarct. Although the differential includes seizures. Metabolic derangements so far has not been that significant although she was noted to have respiratory acidosis.Medication effect is also a possibility.The patient has a brain MRI pending. There is a past history of lacunar infarct. She will be maintained on aspirin antiplatelet agents for now. Depending on the brain MRI, she may need to have another stroke workup. An EEG will be obtained.There is a questionable history of seizure but this has never been verified an EEG has been negative in the past. The patient's arterial blood gas suggest that she presented acidemia with a respiratory acidosis. This suggests possibility of a seizure. Again EEG is pending. She does have a workup that is unrevealing but suspicious for seizure, we will likely have to discontinue the patient's ampyra used to treat multiple sclerosis which can be associated with seizures.  This is a 24--old black female who is well known to me. She has a long-standing history of multiple sclerosis. Patient will fourthly has had progressive gait impairment due to the multiple sclerosis. She consequently has been in a nursing home facility/group home for the last couple years. She proceed has been noncompliant with her immunomodulating therapy for MS. The patient presents with acute altered mental status characterized per the chart and the nursing home facility as the patient being confused, talking on her head and having nonsensical speech. She apparently also has had increasing urination and what appears to be difficulties in the right hand. I see the patient's routinely a few times a year and she does have some dysarthria and  mild cognitive impairment but generally is lucid and appropriate. There is a questionable history of seizures documented last year but the semiology was not convincing. Additionally, repeated EEGs have been unremarkable. However, the Keppra was continued. There is also a past history of lacunar infarct with right-sided weakness. Workup in 2013 shows normal ejection fraction of 55% and unremarkable carotid duplex Doppler. She was placed on aspirin antiplatelet agent. The review of systems is unobtainable because of the marked cognitive impairment.  GENERAL: The patient is sitting in the bed and appears somewhat irritated and restless. She clearly has impaired cognition over baseline.  HEENT: Supple. Atraumatic normocephalic.   ABDOMEN: soft  EXTREMITIES: No edema   BACK: Normal.  SKIN: Normal by inspection.    MENTAL STATUS: She is awake but markedly confused and disoriented. She has marked impairment of comprehension and is perseverating on a couple of things including that she is hurting and something about her X. Her speech is quite/severely dysarthric and significantly worse over baseline. She does not follow commands. Again comprehension is severely impaired.  CRANIAL NERVES: Pupils are equal, round and reactive to light and accommodation; extra ocular movements are full, there is no significant nystagmus; visual fields are full- though the exam is limited; upper and lower facial muscles are normal in strength and symmetric, there is no flattening of the nasolabial folds; tongue is midline; uvula is midline; shoulder elevation is normal.  MOTOR: Although this is limited, she appears to move both sides well and has at least 4/5 strength in upper and lower extremities. Bulk and tone are generally unremarkable.  COORDINATION: No rest tremor; no intention tremor;  no postural tremor; no bradykinesia. There is no dysmetria.  REFLEXES: Deep tendon reflexes are symmetrical and normal. Babinski  reflexes are flexor bilaterally.   SENSATION: She seems to respond to painful stimuli less on the left side.      Past Medical History  Diagnosis Date  . Hepatitis     ETOH related   . ETOH abuse   . Hx of abnormal cervical Pap smear 1997  . Colon polyps 1997  . Genital warts   . Multiple sclerosis   . Hypokalemia   . Rhabdomyolysis   . Anxiety   . Stroke   . History of electroencephalogram 08/2012    normal    Past Surgical History  Procedure Laterality Date  . Bilatetral tubal ligation      Family History  Problem Relation Age of Onset  . Coronary artery disease Brother   . Diabetes Brother   . Hypertension Brother   . Hypertension Mother   . Diabetes Mother   . Hypertension Sister   . Alcohol abuse      family history     Social History:  reports that she has been smoking Cigarettes.  She has a 15 pack-year smoking history. She uses smokeless tobacco. She reports that she does not drink alcohol or use illicit drugs.  Allergies: No Known Allergies  Medications: Prior to Admission medications   Medication Sig Start Date End Date Taking? Authorizing Provider  aspirin 325 MG EC tablet Take 325 mg by mouth daily.   Yes Historical Provider, MD  cyclobenzaprine (FLEXERIL) 10 MG tablet Take 1 tablet (10 mg total) by mouth 2 (two) times daily. 08/02/12  Yes Johnna Acosta, MD  dalfampridine (AMPYRA) 10 MG TB12 Take 10 mg by mouth 2 (two) times daily.   Yes Historical Provider, MD  levETIRAcetam (KEPPRA) 500 MG tablet Take 1 tablet (500 mg total) by mouth 2 (two) times daily. 08/27/12  Yes Rosita Fire, MD  loratadine (CLARITIN) 10 MG tablet Take 1 tablet (10 mg total) by mouth daily. 08/02/12  Yes Johnna Acosta, MD  metoprolol tartrate (LOPRESSOR) 25 MG tablet Take 1 tablet (25 mg total) by mouth 2 (two) times daily. 08/02/12  Yes Johnna Acosta, MD  montelukast (SINGULAIR) 10 MG tablet Take 1 tablet (10 mg total) by mouth at bedtime. 08/02/12  Yes Johnna Acosta, MD    Multiple Vitamin (MULTIVITAMIN WITH MINERALS) TABS Take 1 tablet by mouth daily. 08/02/12  Yes Johnna Acosta, MD  simvastatin (ZOCOR) 40 MG tablet Take 1 tablet (40 mg total) by mouth at bedtime. 08/02/12  Yes Johnna Acosta, MD  traZODone (DESYREL) 100 MG tablet Take 200 mg by mouth at bedtime. 08/02/12  Yes Johnna Acosta, MD  venlafaxine XR (EFFEXOR-XR) 150 MG 24 hr capsule Take 1 capsule (150 mg total) by mouth daily. 08/02/12  Yes Johnna Acosta, MD  venlafaxine XR (EFFEXOR-XR) 75 MG 24 hr capsule Take 75 mg by mouth daily. Taken with $RemoveBe'150mg'RCkTozmNA$  capsule   Yes Historical Provider, MD  HYDROcodone-acetaminophen (NORCO/VICODIN) 5-325 MG per tablet Take 1 tablet by mouth every 4 (four) hours as needed. Pain 08/02/12   Johnna Acosta, MD  LORazepam (ATIVAN) 2 MG tablet Take 1 tablet (2 mg total) by mouth every 4 (four) hours as needed for anxiety. 08/02/12   Johnna Acosta, MD    Scheduled Meds: . enoxaparin (LOVENOX) injection  40 mg Subcutaneous Q24H  . levETIRAcetam  500 mg Intravenous Q12H  . metoprolol  5 mg Intravenous Q6H  . nitroGLYCERIN  1 inch Topical Q6H  . sodium chloride  3 mL Intravenous Q12H  . thiamine  100 mg Intravenous Daily   Continuous Infusions: . sodium chloride 100 mL/hr at 07/06/13 2332   PRN Meds:.acetaminophen, acetaminophen, hydrALAZINE, ondansetron (ZOFRAN) IV, ondansetron   Blood pressure 163/102, pulse 119, temperature 98.3 F (36.8 C), temperature source Axillary, resp. rate 26, height $RemoveBe'5\' 8"'BUnzRUmtP$  (1.727 m), weight 91.8 kg (202 lb 6.1 oz), SpO2 94.00%.   Results for orders placed during the hospital encounter of 07/06/13 (from the past 48 hour(s))  CBC WITH DIFFERENTIAL     Status: Abnormal   Collection Time    07/06/13  7:13 PM      Result Value Range   WBC 7.7  4.0 - 10.5 K/uL   RBC 5.53 (*) 3.87 - 5.11 MIL/uL   Hemoglobin 15.7 (*) 12.0 - 15.0 g/dL   HCT 43.8  36.0 - 46.0 %   MCV 79.2  78.0 - 100.0 fL   MCH 28.4  26.0 - 34.0 pg   MCHC 35.8  30.0 - 36.0 g/dL    RDW 14.0  11.5 - 15.5 %   Platelets 305  150 - 400 K/uL   Neutrophils Relative % 56  43 - 77 %   Neutro Abs 4.3  1.7 - 7.7 K/uL   Lymphocytes Relative 35  12 - 46 %   Lymphs Abs 2.7  0.7 - 4.0 K/uL   Monocytes Relative 8  3 - 12 %   Monocytes Absolute 0.6  0.1 - 1.0 K/uL   Eosinophils Relative 1  0 - 5 %   Eosinophils Absolute 0.1  0.0 - 0.7 K/uL   Basophils Relative 1  0 - 1 %   Basophils Absolute 0.0  0.0 - 0.1 K/uL  COMPREHENSIVE METABOLIC PANEL     Status: Abnormal   Collection Time    07/06/13  7:13 PM      Result Value Range   Sodium 140  137 - 147 mEq/L   Potassium 3.6 (*) 3.7 - 5.3 mEq/L   Chloride 98  96 - 112 mEq/L   CO2 29  19 - 32 mEq/L   Glucose, Bld 144 (*) 70 - 99 mg/dL   BUN 9  6 - 23 mg/dL   Creatinine, Ser 0.43 (*) 0.50 - 1.10 mg/dL   Calcium 9.5  8.4 - 10.5 mg/dL   Total Protein 8.3  6.0 - 8.3 g/dL   Albumin 4.1  3.5 - 5.2 g/dL   AST 15  0 - 37 U/L   ALT 11  0 - 35 U/L   Alkaline Phosphatase 66  39 - 117 U/L   Total Bilirubin 0.3  0.3 - 1.2 mg/dL   GFR calc non Af Amer >90  >90 mL/min   GFR calc Af Amer >90  >90 mL/min   Comment: (NOTE)     The eGFR has been calculated using the CKD EPI equation.     This calculation has not been validated in all clinical situations.     eGFR's persistently <90 mL/min signify possible Chronic Kidney     Disease.  ETHANOL     Status: None   Collection Time    07/06/13  7:13 PM      Result Value Range   Alcohol, Ethyl (B) <11  0 - 11 mg/dL   Comment:            LOWEST DETECTABLE LIMIT FOR  SERUM ALCOHOL IS 11 mg/dL     FOR MEDICAL PURPOSES ONLY  TROPONIN I     Status: None   Collection Time    07/06/13  7:13 PM      Result Value Range   Troponin I <0.30  <0.30 ng/mL   Comment:            Due to the release kinetics of cTnI,     a negative result within the first hours     of the onset of symptoms does not rule out     myocardial infarction with certainty.     If myocardial infarction is still suspected,      repeat the test at appropriate intervals.  LACTIC ACID, PLASMA     Status: None   Collection Time    07/06/13  7:14 PM      Result Value Range   Lactic Acid, Venous 0.9  0.5 - 2.2 mmol/L  URINALYSIS W MICROSCOPIC + REFLEX CULTURE     Status: Abnormal   Collection Time    07/06/13  7:33 PM      Result Value Range   Color, Urine YELLOW  YELLOW   APPearance CLEAR  CLEAR   Specific Gravity, Urine 1.015  1.005 - 1.030   pH 6.0  5.0 - 8.0   Glucose, UA NEGATIVE  NEGATIVE mg/dL   Hgb urine dipstick MODERATE (*) NEGATIVE   Bilirubin Urine NEGATIVE  NEGATIVE   Ketones, ur TRACE (*) NEGATIVE mg/dL   Protein, ur NEGATIVE  NEGATIVE mg/dL   Urobilinogen, UA 0.2  0.0 - 1.0 mg/dL   Nitrite NEGATIVE  NEGATIVE   Leukocytes, UA NEGATIVE  NEGATIVE   WBC, UA 0-2  <3 WBC/hpf   RBC / HPF 3-6  <3 RBC/hpf  URINE RAPID DRUG SCREEN (HOSP PERFORMED)     Status: None   Collection Time    07/06/13  7:33 PM      Result Value Range   Opiates NONE DETECTED  NONE DETECTED   Cocaine NONE DETECTED  NONE DETECTED   Benzodiazepines NONE DETECTED  NONE DETECTED   Amphetamines NONE DETECTED  NONE DETECTED   Tetrahydrocannabinol NONE DETECTED  NONE DETECTED   Barbiturates NONE DETECTED  NONE DETECTED   Comment:            DRUG SCREEN FOR MEDICAL PURPOSES     ONLY.  IF CONFIRMATION IS NEEDED     FOR ANY PURPOSE, NOTIFY LAB     WITHIN 5 DAYS.                LOWEST DETECTABLE LIMITS     FOR URINE DRUG SCREEN     Drug Class       Cutoff (ng/mL)     Amphetamine      1000     Barbiturate      200     Benzodiazepine   563     Tricyclics       149     Opiates          300     Cocaine          300     THC              50  MRSA PCR SCREENING     Status: None   Collection Time    07/06/13 11:16 PM      Result Value Range   MRSA by PCR NEGATIVE  NEGATIVE   Comment:  The GeneXpert MRSA Assay (FDA     approved for NASAL specimens     only), is one component of a     comprehensive MRSA  colonization     surveillance program. It is not     intended to diagnose MRSA     infection nor to guide or     monitor treatment for     MRSA infections.  BLOOD GAS, ARTERIAL     Status: Abnormal   Collection Time    07/06/13 11:35 PM      Result Value Range   O2 Content 2.0     Delivery systems NASAL CANNULA     pH, Arterial 7.335 (*) 7.350 - 7.450   pCO2 arterial 53.2 (*) 35.0 - 45.0 mmHg   pO2, Arterial 89.1  80.0 - 100.0 mmHg   Bicarbonate 27.6 (*) 20.0 - 24.0 mEq/L   TCO2 24.2  0 - 100 mmol/L   Acid-Base Excess 2.3 (*) 0.0 - 2.0 mmol/L   O2 Saturation 95.6     Patient temperature 37.0     Collection site LEFT RADIAL     Drawn by [id number]     Sample type ARTERIAL     Allens test (pass/fail) PASS  PASS  COMPREHENSIVE METABOLIC PANEL     Status: Abnormal   Collection Time    07/07/13  4:38 AM      Result Value Range   Sodium 138  137 - 147 mEq/L   Potassium 3.7  3.7 - 5.3 mEq/L   Chloride 96  96 - 112 mEq/L   CO2 27  19 - 32 mEq/L   Glucose, Bld 162 (*) 70 - 99 mg/dL   BUN 7  6 - 23 mg/dL   Creatinine, Ser 0.41 (*) 0.50 - 1.10 mg/dL   Calcium 9.3  8.4 - 10.5 mg/dL   Total Protein 8.3  6.0 - 8.3 g/dL   Albumin 4.0  3.5 - 5.2 g/dL   AST 13  0 - 37 U/L   ALT 11  0 - 35 U/L   Alkaline Phosphatase 66  39 - 117 U/L   Total Bilirubin 0.3  0.3 - 1.2 mg/dL   GFR calc non Af Amer >90  >90 mL/min   GFR calc Af Amer >90  >90 mL/min   Comment: (NOTE)     The eGFR has been calculated using the CKD EPI equation.     This calculation has not been validated in all clinical situations.     eGFR's persistently <90 mL/min signify possible Chronic Kidney     Disease.  CBC     Status: Abnormal   Collection Time    07/07/13  4:38 AM      Result Value Range   WBC 8.0  4.0 - 10.5 K/uL   RBC 5.62 (*) 3.87 - 5.11 MIL/uL   Hemoglobin 15.5 (*) 12.0 - 15.0 g/dL   HCT 43.9  36.0 - 46.0 %   MCV 78.1  78.0 - 100.0 fL   MCH 27.6  26.0 - 34.0 pg   MCHC 35.3  30.0 - 36.0 g/dL   RDW 13.9   11.5 - 15.5 %   Platelets 318  150 - 400 K/uL    Dg Chest 1 View  07/06/2013   CLINICAL DATA:  Altered mental status.  EXAM: CHEST - 1 VIEW  COMPARISON:  Single view of the chest 12/26/2012.  FINDINGS: There is some discoid atelectasis in the left lung base. Mild subsegmental atelectasis  is also seen in the right base. No consolidative process, pneumothorax or effusion. Heart size is normal.  IMPRESSION: No acute finding.   Electronically Signed   By: Inge Rise M.D.   On: 07/06/2013 20:34   Ct Head Wo Contrast  07/06/2013   CLINICAL DATA:  Altered mental status  EXAM: CT HEAD WITHOUT CONTRAST  TECHNIQUE: Contiguous axial images were obtained from the base of the skull through the vertex without intravenous contrast.  COMPARISON:  02/06/2013  FINDINGS: Calvarium is intact. There is significant diffuse atrophy and low attenuation in the deep white matter most consistent with chronic small vessel ischemic change. There is no evidence of vascular territory infarct. There is no evidence of mass. There is no hemorrhage or extra-axial fluid. There is no hydrocephalus.  IMPRESSION: Chronic degenerative changes.  No acute findings.   Electronically Signed   By: Skipper Cliche M.D.   On: 07/06/2013 20:10        Marsalis Beaulieu A. Merlene Laughter, M.D.  Diplomate, Tax adviser of Psychiatry and Neurology ( Neurology). 07/07/2013, 8:48 AM
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
## 2013-07-08 ENCOUNTER — Other Ambulatory Visit (HOSPITAL_COMMUNITY): Payer: Medicare Other

## 2013-07-08 DIAGNOSIS — R4182 Altered mental status, unspecified: Secondary | ICD-10-CM | POA: Diagnosis not present

## 2013-07-08 DIAGNOSIS — G35 Multiple sclerosis: Secondary | ICD-10-CM | POA: Diagnosis not present

## 2013-07-08 DIAGNOSIS — R569 Unspecified convulsions: Secondary | ICD-10-CM | POA: Diagnosis not present

## 2013-07-08 DIAGNOSIS — I10 Essential (primary) hypertension: Secondary | ICD-10-CM | POA: Diagnosis not present

## 2013-07-08 DIAGNOSIS — G9340 Encephalopathy, unspecified: Secondary | ICD-10-CM | POA: Diagnosis not present

## 2013-07-08 LAB — RPR: RPR Ser Ql: NONREACTIVE

## 2013-07-08 LAB — SEDIMENTATION RATE: SED RATE: 3 mm/h (ref 0–22)

## 2013-07-08 LAB — VITAMIN B12: Vitamin B-12: 706 pg/mL (ref 211–911)

## 2013-07-08 LAB — HOMOCYSTEINE: HOMOCYSTEINE-NORM: 9.2 umol/L (ref 4.0–15.4)

## 2013-07-08 MED ORDER — VITAMIN B-1 100 MG PO TABS
100.0000 mg | ORAL_TABLET | Freq: Every day | ORAL | Status: DC
  Administered 2013-07-09 – 2013-07-10 (×2): 100 mg via ORAL
  Filled 2013-07-08 (×2): qty 1

## 2013-07-08 MED ORDER — METOPROLOL TARTRATE 25 MG PO TABS
25.0000 mg | ORAL_TABLET | Freq: Two times a day (BID) | ORAL | Status: DC
  Administered 2013-07-08 – 2013-07-10 (×5): 25 mg via ORAL
  Filled 2013-07-08 (×5): qty 1

## 2013-07-08 MED ORDER — SODIUM CHLORIDE 0.9 % IV SOLN
1000.0000 mg | Freq: Two times a day (BID) | INTRAVENOUS | Status: DC
  Administered 2013-07-08 (×2): 1000 mg via INTRAVENOUS
  Filled 2013-07-08 (×5): qty 10

## 2013-07-08 MED ORDER — NICOTINE 21 MG/24HR TD PT24
21.0000 mg | MEDICATED_PATCH | Freq: Every day | TRANSDERMAL | Status: DC
  Administered 2013-07-08 – 2013-07-10 (×3): 21 mg via TRANSDERMAL
  Filled 2013-07-08 (×3): qty 1

## 2013-07-08 MED ORDER — HYDRALAZINE HCL 25 MG PO TABS
25.0000 mg | ORAL_TABLET | ORAL | Status: AC
  Administered 2013-07-08: 25 mg via ORAL
  Filled 2013-07-08: qty 1

## 2013-07-08 NOTE — Progress Notes (Addendum)
Patient ID: Tami Nolan, female   DOB: Jul 23, 1960, 53 y.o.   MRN: [id number]  Noel A. Merlene Laughter, MD     [email or web address]          Tami Nolan is an 53 y.o. female.   Assessment/Plan: 1. Acute encephalopathy/altered mental status. The acute presentation along with marked aphasia and dysarthria suggests an acute infarct. Although the differential includes seizures. Metabolic derangements so far has not been that significant although she was noted to have respiratory acidosis. Medication effect is also a possibility. MRI is limited due to significant motion artifact but does not clearly shows an acute infarct. There is increased signal that may be seen involving the left temporal region on FLAIR imaging but this is questionable. This could represent seizures is real or also possible small cortical stroke. Consequent, the patient's Keppra will be increased to 1000 mg twice a day. We will obtain an EEG. We'll continue with antiplatelet agents. We'll continue to follow the patient. She appears her responded well to neuroleptics for the agitation and belligerent behavior. We may consider continuing this. Dementia labs will also be obtained.  The patient was quite belligerent yesterday. She is pulling stuff, scratching and hit people. She pulled her IV. Ativan was not that helpful. She was given Haldol before an MRI scan which appears to have worked better for the patient.   GENERAL: She is lying in bed and seems a lot more calmer today than she was on yesterday. HEENT: Supple. Atraumatic normocephalic.  ABDOMEN: soft  EXTREMITIES: No edema  BACK: Normal.  SKIN: Normal by inspection.  MENTAL STATUS: She is awake but markedly confused and disoriented. Again, she has significant perseveration. She does follow commands somewhat better today but still has significant issues with comprehension and it would appear to be significant aphasia and the naming. The patient stays very  little and discuss perseverates on saying 2. She is oriented 1. She does not recognizes me. CRANIAL NERVES: Pupils are equal, round and reactive to light and accommodation; extra ocular movements are full, there is no significant nystagmus; visual fields are full- though the exam is limited; upper and lower facial muscles are normal in strength and symmetric, there is no flattening of the nasolabial folds; tongue is midline; uvula is midline; shoulder elevation is normal.  MOTOR: Although this is limited, she appears to move both sides well and has at least 4/5 strength in upper and lower extremities. Bulk and tone are generally unremarkable.  COORDINATION: No rest tremor; no intention tremor; no postural tremor; no bradykinesia. There is no dysmetria.   The patient's brain MRI is reviewed in person. There is significant motion artifact. There appears that there may be increased signal seen on FLAIR images involving the medial temporal region on the left side although this is an area where artifact usually occurs.     Objective: Vital signs in last 24 hours: Temp:  [97.9 F (36.6 C)-99.2 F (37.3 C)] 99.2 F (37.3 C) (02/03 0400) Pulse Rate:  [42-137] 131 (02/03 0600) Resp:  [12-31] 23 (02/03 0600) BP: (134-203)/(85-147) 158/97 mmHg (02/03 0600) SpO2:  [86 %-100 %] 99 % (02/03 0600) Weight:  [91.6 kg (201 lb 15.1 oz)] 91.6 kg (201 lb 15.1 oz) (02/03 0500)  Intake/Output from previous day: 02/02 0701 - 02/03 0700 In: 1575 [P.O.:75; I.V.:1500] Out: 200 [Urine:200] Intake/Output this shift:   Nutritional status:     Lab Results: Results for orders placed during the hospital encounter of 07/06/13 (from  the past 48 hour(s))  CBC WITH DIFFERENTIAL     Status: Abnormal   Collection Time    07/06/13  7:13 PM      Result Value Range   WBC 7.7  4.0 - 10.5 K/uL   RBC 5.53 (*) 3.87 - 5.11 MIL/uL   Hemoglobin 15.7 (*) 12.0 - 15.0 g/dL   HCT 43.8  36.0 - 46.0 %   MCV 79.2  78.0 - 100.0 fL    MCH 28.4  26.0 - 34.0 pg   MCHC 35.8  30.0 - 36.0 g/dL   RDW 14.0  11.5 - 15.5 %   Platelets 305  150 - 400 K/uL   Neutrophils Relative % 56  43 - 77 %   Neutro Abs 4.3  1.7 - 7.7 K/uL   Lymphocytes Relative 35  12 - 46 %   Lymphs Abs 2.7  0.7 - 4.0 K/uL   Monocytes Relative 8  3 - 12 %   Monocytes Absolute 0.6  0.1 - 1.0 K/uL   Eosinophils Relative 1  0 - 5 %   Eosinophils Absolute 0.1  0.0 - 0.7 K/uL   Basophils Relative 1  0 - 1 %   Basophils Absolute 0.0  0.0 - 0.1 K/uL  COMPREHENSIVE METABOLIC PANEL     Status: Abnormal   Collection Time    07/06/13  7:13 PM      Result Value Range   Sodium 140  137 - 147 mEq/L   Potassium 3.6 (*) 3.7 - 5.3 mEq/L   Chloride 98  96 - 112 mEq/L   CO2 29  19 - 32 mEq/L   Glucose, Bld 144 (*) 70 - 99 mg/dL   BUN 9  6 - 23 mg/dL   Creatinine, Ser 0.43 (*) 0.50 - 1.10 mg/dL   Calcium 9.5  8.4 - 10.5 mg/dL   Total Protein 8.3  6.0 - 8.3 g/dL   Albumin 4.1  3.5 - 5.2 g/dL   AST 15  0 - 37 U/L   ALT 11  0 - 35 U/L   Alkaline Phosphatase 66  39 - 117 U/L   Total Bilirubin 0.3  0.3 - 1.2 mg/dL   GFR calc non Af Amer >90  >90 mL/min   GFR calc Af Amer >90  >90 mL/min   Comment: (NOTE)     The eGFR has been calculated using the CKD EPI equation.     This calculation has not been validated in all clinical situations.     eGFR's persistently <90 mL/min signify possible Chronic Kidney     Disease.  ETHANOL     Status: None   Collection Time    07/06/13  7:13 PM      Result Value Range   Alcohol, Ethyl (B) <11  0 - 11 mg/dL   Comment:            LOWEST DETECTABLE LIMIT FOR     SERUM ALCOHOL IS 11 mg/dL     FOR MEDICAL PURPOSES ONLY  TROPONIN I     Status: None   Collection Time    07/06/13  7:13 PM      Result Value Range   Troponin I <0.30  <0.30 ng/mL   Comment:            Due to the release kinetics of cTnI,     a negative result within the first hours     of the onset of symptoms does not rule out  myocardial infarction with  certainty.     If myocardial infarction is still suspected,     repeat the test at appropriate intervals.  LACTIC ACID, PLASMA     Status: None   Collection Time    07/06/13  7:14 PM      Result Value Range   Lactic Acid, Venous 0.9  0.5 - 2.2 mmol/L  URINALYSIS W MICROSCOPIC + REFLEX CULTURE     Status: Abnormal   Collection Time    07/06/13  7:33 PM      Result Value Range   Color, Urine YELLOW  YELLOW   APPearance CLEAR  CLEAR   Specific Gravity, Urine 1.015  1.005 - 1.030   pH 6.0  5.0 - 8.0   Glucose, UA NEGATIVE  NEGATIVE mg/dL   Hgb urine dipstick MODERATE (*) NEGATIVE   Bilirubin Urine NEGATIVE  NEGATIVE   Ketones, ur TRACE (*) NEGATIVE mg/dL   Protein, ur NEGATIVE  NEGATIVE mg/dL   Urobilinogen, UA 0.2  0.0 - 1.0 mg/dL   Nitrite NEGATIVE  NEGATIVE   Leukocytes, UA NEGATIVE  NEGATIVE   WBC, UA 0-2  <3 WBC/hpf   RBC / HPF 3-6  <3 RBC/hpf  URINE RAPID DRUG SCREEN (HOSP PERFORMED)     Status: None   Collection Time    07/06/13  7:33 PM      Result Value Range   Opiates NONE DETECTED  NONE DETECTED   Cocaine NONE DETECTED  NONE DETECTED   Benzodiazepines NONE DETECTED  NONE DETECTED   Amphetamines NONE DETECTED  NONE DETECTED   Tetrahydrocannabinol NONE DETECTED  NONE DETECTED   Barbiturates NONE DETECTED  NONE DETECTED   Comment:            DRUG SCREEN FOR MEDICAL PURPOSES     ONLY.  IF CONFIRMATION IS NEEDED     FOR ANY PURPOSE, NOTIFY LAB     WITHIN 5 DAYS.                LOWEST DETECTABLE LIMITS     FOR URINE DRUG SCREEN     Drug Class       Cutoff (ng/mL)     Amphetamine      1000     Barbiturate      200     Benzodiazepine   734     Tricyclics       193     Opiates          300     Cocaine          300     THC              50  MRSA PCR SCREENING     Status: None   Collection Time    07/06/13 11:16 PM      Result Value Range   MRSA by PCR NEGATIVE  NEGATIVE   Comment:            The GeneXpert MRSA Assay (FDA     approved for NASAL specimens      only), is one component of a     comprehensive MRSA colonization     surveillance program. It is not     intended to diagnose MRSA     infection nor to guide or     monitor treatment for     MRSA infections.  BLOOD GAS, ARTERIAL     Status: Abnormal   Collection Time  07/06/13 11:35 PM      Result Value Range   O2 Content 2.0     Delivery systems NASAL CANNULA     pH, Arterial 7.335 (*) 7.350 - 7.450   pCO2 arterial 53.2 (*) 35.0 - 45.0 mmHg   pO2, Arterial 89.1  80.0 - 100.0 mmHg   Bicarbonate 27.6 (*) 20.0 - 24.0 mEq/L   TCO2 24.2  0 - 100 mmol/L   Acid-Base Excess 2.3 (*) 0.0 - 2.0 mmol/L   O2 Saturation 95.6     Patient temperature 37.0     Collection site LEFT RADIAL     Drawn by [id number]     Sample type ARTERIAL     Allens test (pass/fail) PASS  PASS  COMPREHENSIVE METABOLIC PANEL     Status: Abnormal   Collection Time    07/07/13  4:38 AM      Result Value Range   Sodium 138  137 - 147 mEq/L   Potassium 3.7  3.7 - 5.3 mEq/L   Chloride 96  96 - 112 mEq/L   CO2 27  19 - 32 mEq/L   Glucose, Bld 162 (*) 70 - 99 mg/dL   BUN 7  6 - 23 mg/dL   Creatinine, Ser 0.09 (*) 0.50 - 1.10 mg/dL   Calcium 9.3  8.4 - 38.1 mg/dL   Total Protein 8.3  6.0 - 8.3 g/dL   Albumin 4.0  3.5 - 5.2 g/dL   AST 13  0 - 37 U/L   ALT 11  0 - 35 U/L   Alkaline Phosphatase 66  39 - 117 U/L   Total Bilirubin 0.3  0.3 - 1.2 mg/dL   GFR calc non Af Amer >90  >90 mL/min   GFR calc Af Amer >90  >90 mL/min   Comment: (NOTE)     The eGFR has been calculated using the CKD EPI equation.     This calculation has not been validated in all clinical situations.     eGFR's persistently <90 mL/min signify possible Chronic Kidney     Disease.  CBC     Status: Abnormal   Collection Time    07/07/13  4:38 AM      Result Value Range   WBC 8.0  4.0 - 10.5 K/uL   RBC 5.62 (*) 3.87 - 5.11 MIL/uL   Hemoglobin 15.5 (*) 12.0 - 15.0 g/dL   HCT 82.9  93.7 - 16.9 %   MCV 78.1  78.0 - 100.0 fL   MCH 27.6  26.0 -  34.0 pg   MCHC 35.3  30.0 - 36.0 g/dL   RDW 67.8  93.8 - 10.1 %   Platelets 318  150 - 400 K/uL  TSH     Status: Abnormal   Collection Time    07/07/13  4:38 AM      Result Value Range   TSH 0.327 (*) 0.350 - 4.500 uIU/mL   Comment: Performed at [facility]    Lipid Panel No results found for this basename: CHOL, TRIG, HDL, CHOLHDL, VLDL, LDLCALC,  in the last 72 hours  Studies/Results: Dg Chest 1 View  07/06/2013   CLINICAL DATA:  Altered mental status.  EXAM: CHEST - 1 VIEW  COMPARISON:  Single view of the chest 12/26/2012.  FINDINGS: There is some discoid atelectasis in the left lung base. Mild subsegmental atelectasis is also seen in the right base. No consolidative process, pneumothorax or effusion. Heart size is normal.  IMPRESSION: No acute  finding.   Electronically Signed   By: Inge Rise M.D.   On: 07/06/2013 20:34   Ct Head Wo Contrast  07/06/2013   CLINICAL DATA:  Altered mental status  EXAM: CT HEAD WITHOUT CONTRAST  TECHNIQUE: Contiguous axial images were obtained from the base of the skull through the vertex without intravenous contrast.  COMPARISON:  02/06/2013  FINDINGS: Calvarium is intact. There is significant diffuse atrophy and low attenuation in the deep white matter most consistent with chronic small vessel ischemic change. There is no evidence of vascular territory infarct. There is no evidence of mass. There is no hemorrhage or extra-axial fluid. There is no hydrocephalus.  IMPRESSION: Chronic degenerative changes.  No acute findings.   Electronically Signed   By: Skipper Cliche M.D.   On: 07/06/2013 20:10   Mr Brain Wo Contrast  07/07/2013   CLINICAL DATA:  Altered mental status  EXAM: MRI HEAD WITHOUT CONTRAST  TECHNIQUE: Multiplanar, multiecho pulse sequences of the brain and surrounding structures were obtained without intravenous contrast.  COMPARISON:  Head CT T1 2015.  MRI 12/26/2012.  FINDINGS: Diffusion imaging does not show any acute or subacute  infarction. The brain shows generalized atrophy. There chronic small vessel changes of the pons and deep white matter. No sign of mass lesion, hemorrhage, hydrocephalus or extra-axial collection. Examination a shortened and motion degraded.  IMPRESSION: Shortened and motion degraded exam. Diffusion imaging does not show any acute or subacute infarction. No other reversible process evident.   Electronically Signed   By: Nelson Chimes M.D.   On: 07/07/2013 19:58    Medications:  Scheduled Meds: . enoxaparin (LOVENOX) injection  40 mg Subcutaneous Q24H  . levETIRAcetam  500 mg Intravenous Q12H  . metoprolol  5 mg Intravenous Q6H  . nitroGLYCERIN  1 inch Topical Q6H  . sodium chloride  3 mL Intravenous Q12H  . thiamine  100 mg Intravenous Daily   Continuous Infusions: . sodium chloride 100 mL/hr at 07/08/13 0006   PRN Meds:.acetaminophen, acetaminophen, hydrALAZINE, LORazepam, ondansetron (ZOFRAN) IV, ondansetron     LOS: 2 days   Annalynn Centanni A. Merlene Laughter, M.D.  Diplomate, Tax adviser of Psychiatry and Neurology ( Neurology).

## 2013-07-08 NOTE — Progress Notes (Signed)
Patient refused EEG again

## 2013-07-08 NOTE — Progress Notes (Signed)
Nutrition Brief Note  RD pulled to chart due to low braden score (11)  Wt Readings from Last 15 Encounters:  07/08/13 201 lb 15.1 oz (91.6 kg)  03/19/13 200 lb (90.719 kg)  12/26/12 200 lb (90.719 kg)  08/23/12 200 lb 4.8 oz (90.855 kg)  08/01/12 210 lb (95.255 kg)  04/18/12 158 lb 6.4 oz (71.85 kg)  02/26/12 161 lb (73.029 kg)  02/15/11 163 lb 7 oz (74.135 kg)   Chart reviewed. Pt is a resident of [facility]. Noted wt stable over the past year. Pt admitted with AMS. Pt has been very combative. Neurology following.   Body mass index is 30.71 kg/(m^2). Patient meets criteria for obesity, class I based on current BMI.   Current diet order is Heart Healthy, patient is consuming approximately n/a% of meals at this time. Labs and medications reviewed.   No nutrition interventions warranted at this time. If nutrition issues arise, please consult RD.   Rayvon Brandvold A. Mayford KnifeWilliams, RD, LDN Pager: [phone]

## 2013-07-08 NOTE — Progress Notes (Signed)
BP has been trending upward, Dr. Felecia Shelling notified, orders received.

## 2013-07-08 NOTE — Progress Notes (Signed)
Transported to room 325 via w/c in stable condition, hydralazine 25mg  po given per one time order for elevated BP, reported to C. Morris, Charity fundraiser.

## 2013-07-08 NOTE — Progress Notes (Signed)
Subjective: Patient is more awake and alert today. She calmer and cooperating. EEG couldn't done yesterday because she was moving constantly. MRI was done but it had a lot of movement. No acute stroke on the MRI Objective: Vital signs in last 24 hours: Temp:  [97.9 F (36.6 C)-99.2 F (37.3 C)] 99.2 F (37.3 C) (02/03 0400) Pulse Rate:  [42-137] 131 (02/03 0600) Resp:  [12-31] 23 (02/03 0600) BP: (134-203)/(85-147) 158/97 mmHg (02/03 0600) SpO2:  [86 %-100 %] 99 % (02/03 0600) Weight:  [91.6 kg (201 lb 15.1 oz)] 91.6 kg (201 lb 15.1 oz) (02/03 0500) Weight change: -3.655 kg (-8 lb 0.9 oz) Last BM Date:  (unknown)  Intake/Output from previous day: 02/02 0701 - 02/03 0700 In: 1575 [P.O.:75; I.V.:1500] Out: 200 [Urine:200]  PHYSICAL EXAM General appearance: slowed mentation Resp: diminished breath sounds bilaterally and rhonchi bilaterally Cardio: S1, S2 normal GI: soft, non-tender; bowel sounds normal; no masses,  no organomegaly Extremities: extremities normal, atraumatic, no cyanosis or edema  Lab Results:    @labtest @ [facility]  Recent Labs  07/06/13 2335  PHART 7.335*  PO2ART 89.1  TCO2 24.2  HCO3 27.6*   CULTURES Recent Results (from the past 240 hour(s))  MRSA PCR SCREENING     Status: None   Collection Time    07/06/13 11:16 PM      Result Value Range Status   MRSA by PCR NEGATIVE  NEGATIVE Final   Comment:            The GeneXpert MRSA Assay (FDA     approved for NASAL specimens     only), is one component of a     comprehensive MRSA colonization     surveillance program. It is not     intended to diagnose MRSA     infection nor to guide or     monitor treatment for     MRSA infections.   Studies/Results: Dg Chest 1 View  07/06/2013   CLINICAL DATA:  Altered mental status.  EXAM: CHEST - 1 VIEW  COMPARISON:  Single view of the chest 12/26/2012.  FINDINGS: There is some discoid atelectasis in the left lung base. Mild subsegmental atelectasis is also seen  in the right base. No consolidative process, pneumothorax or effusion. Heart size is normal.  IMPRESSION: No acute finding.   Electronically Signed   By: Drusilla Kanner M.D.   On: 07/06/2013 20:34   Ct Head Wo Contrast  07/06/2013   CLINICAL DATA:  Altered mental status  EXAM: CT HEAD WITHOUT CONTRAST  TECHNIQUE: Contiguous axial images were obtained from the base of the skull through the vertex without intravenous contrast.  COMPARISON:  02/06/2013  FINDINGS: Calvarium is intact. There is significant diffuse atrophy and low attenuation in the deep white matter most consistent with chronic small vessel ischemic change. There is no evidence of vascular territory infarct. There is no evidence of mass. There is no hemorrhage or extra-axial fluid. There is no hydrocephalus.  IMPRESSION: Chronic degenerative changes.  No acute findings.   Electronically Signed   By: Esperanza Heir M.D.   On: 07/06/2013 20:10   Mr Brain Wo Contrast  07/07/2013   CLINICAL DATA:  Altered mental status  EXAM: MRI HEAD WITHOUT CONTRAST  TECHNIQUE: Multiplanar, multiecho pulse sequences of the brain and surrounding structures were obtained without intravenous contrast.  COMPARISON:  Head CT T1 2015.  MRI 12/26/2012.  FINDINGS: Diffusion imaging does not show any acute or subacute infarction. The brain shows generalized  atrophy. There chronic small vessel changes of the pons and deep white matter. No sign of mass lesion, hemorrhage, hydrocephalus or extra-axial collection. Examination a shortened and motion degraded.  IMPRESSION: Shortened and motion degraded exam. Diffusion imaging does not show any acute or subacute infarction. No other reversible process evident.   Electronically Signed   By: Paulina FusiMark  Shogry M.D.   On: 07/07/2013 19:58    Medications: I have reviewed the patient's current medications.  Assesment: Principal Problem:   Acute encephalopathy Active Problems:   History of multiple sclerosis   Seizure disorder    Sinus tachycardia    Plan: Medications reviewed Neurology consult  Appreciated As per neurology plan. Supportive care.    LOS: 2 days   Weronika Birch 07/08/2013, 7:39 AM

## 2013-07-09 ENCOUNTER — Other Ambulatory Visit (HOSPITAL_COMMUNITY): Payer: Medicare Other

## 2013-07-09 DIAGNOSIS — R569 Unspecified convulsions: Secondary | ICD-10-CM | POA: Diagnosis not present

## 2013-07-09 DIAGNOSIS — R4182 Altered mental status, unspecified: Secondary | ICD-10-CM | POA: Diagnosis not present

## 2013-07-09 DIAGNOSIS — G35 Multiple sclerosis: Secondary | ICD-10-CM | POA: Diagnosis not present

## 2013-07-09 DIAGNOSIS — I10 Essential (primary) hypertension: Secondary | ICD-10-CM | POA: Diagnosis not present

## 2013-07-09 DIAGNOSIS — G9340 Encephalopathy, unspecified: Secondary | ICD-10-CM | POA: Diagnosis not present

## 2013-07-09 MED ORDER — OLANZAPINE 5 MG PO TABS
7.5000 mg | ORAL_TABLET | Freq: Every day | ORAL | Status: DC
  Administered 2013-07-09: 7.5 mg via ORAL
  Filled 2013-07-09: qty 2

## 2013-07-09 MED ORDER — LEVETIRACETAM 500 MG PO TABS
1000.0000 mg | ORAL_TABLET | Freq: Two times a day (BID) | ORAL | Status: DC
  Administered 2013-07-09 – 2013-07-10 (×3): 1000 mg via ORAL
  Filled 2013-07-09 (×3): qty 2

## 2013-07-09 MED ORDER — LORAZEPAM 1 MG PO TABS
1.0000 mg | ORAL_TABLET | ORAL | Status: DC | PRN
  Administered 2013-07-09 – 2013-07-10 (×3): 1 mg via ORAL
  Filled 2013-07-09 (×3): qty 1

## 2013-07-09 MED ORDER — HYDRALAZINE HCL 25 MG PO TABS
25.0000 mg | ORAL_TABLET | Freq: Once | ORAL | Status: AC
  Administered 2013-07-09: 25 mg via ORAL
  Filled 2013-07-09: qty 1

## 2013-07-09 NOTE — Progress Notes (Signed)
This patient is receiving Keppra. Based on criteria approved by the Pharmacy and Therapeutics Committee, this medication is being converted to the equivalent oral dose form. These criteria include:   . The patient is eating (either orally or per tube) and/or has been taking other orally administered medications for at least 24 hours.  . This patient has no evidence of active gastrointestinal bleeding or impaired GI absorption (gastrectomy, short bowel, patient on TNA or NPO).   If you have questions about this conversion, please contact the pharmacy department.  Tami Nolan [place], [facility] 07/09/2013 10:50 AM

## 2013-07-09 NOTE — Progress Notes (Signed)
Received in report that patient pulled out IV and was too agitated for RN to srat a new one and would most likely pull the second one out as well. MD made aware and all meds have been switched to PO form. Helmut Muster RN

## 2013-07-09 NOTE — Progress Notes (Signed)
Subjective: Patient is awake and alert but remained confused and disoriented. EEG couldn't yesterday again because patient was agitated and refused the pocedure. Objective: Vital signs in last 24 hours: Temp:  [98.3 F (36.8 C)-99.4 F (37.4 C)] 98.3 F (36.8 C) (02/03 1153) Pulse Rate:  [121-125] 121 (02/03 1826) Resp:  [17] 17 (02/03 0800) BP: (144-186)/(79-112) 169/103 mmHg (02/03 1826) SpO2:  [97 %] 97 % (02/03 0800) Weight change:  Last BM Date:  (unknown)  Intake/Output from previous day: 02/03 0701 - 02/04 0700 In: 110 [IV Piggyback:110] Out: 475 [Urine:475]  PHYSICAL EXAM General appearance: slowed mentation Resp: diminished breath sounds bilaterally and rhonchi bilaterally Cardio: S1, S2 normal GI: soft, non-tender; bowel sounds normal; no masses,  no organomegaly Extremities: extremities normal, atraumatic, no cyanosis or edema  Lab Results:    @labtest @ [facility]  Recent Labs  07/06/13 2335  PHART 7.335*  PO2ART 89.1  TCO2 24.2  HCO3 27.6*   CULTURES Recent Results (from the past 240 hour(s))  MRSA PCR SCREENING     Status: None   Collection Time    07/06/13 11:16 PM      Result Value Range Status   MRSA by PCR NEGATIVE  NEGATIVE Final   Comment:            The GeneXpert MRSA Assay (FDA     approved for NASAL specimens     only), is one component of a     comprehensive MRSA colonization     surveillance program. It is not     intended to diagnose MRSA     infection nor to guide or     monitor treatment for     MRSA infections.   Studies/Results: Mr Brain Wo Contrast  07/07/2013   CLINICAL DATA:  Altered mental status  EXAM: MRI HEAD WITHOUT CONTRAST  TECHNIQUE: Multiplanar, multiecho pulse sequences of the brain and surrounding structures were obtained without intravenous contrast.  COMPARISON:  Head CT T1 2015.  MRI 12/26/2012.  FINDINGS: Diffusion imaging does not show any acute or subacute infarction. The brain shows generalized atrophy. There  chronic small vessel changes of the pons and deep white matter. No sign of mass lesion, hemorrhage, hydrocephalus or extra-axial collection. Examination a shortened and motion degraded.  IMPRESSION: Shortened and motion degraded exam. Diffusion imaging does not show any acute or subacute infarction. No other reversible process evident.   Electronically Signed   By: Paulina FusiMark  Shogry M.D.   On: 07/07/2013 19:58    Medications: I have reviewed the patient's current medications.  Assesment: Principal Problem:   Acute encephalopathy Active Problems:   History of multiple sclerosis   Seizure disorder   Sinus tachycardia    Plan: Medications reviewed Neurology consult  Appreciated As per neurology plan. Supportive care.    LOS: 3 days   Ronak Duquette 07/09/2013, 7:47 AM

## 2013-07-09 NOTE — Progress Notes (Signed)
Patient ID: Tami Nolan, female   DOB: [birth year], 53 y.o.   MRN: [id number]  [facility] NEUROLOGY Katelind Pytel A. Gerilyn Pilgrim, MD     [email or web address]          Tami Nolan is an 53 y.o. female.   Assessment/Plan: 1. Acute encephalopathy/altered mental status. The acute presentation along with marked aphasia and dysarthria suggests an acute infarct. Although the differential includes seizures. Metabolic derangements so far has not been that significant although she was noted to have respiratory acidosis. Medication effect is also a possibility. MRI is limited due to significant motion artifact but does not clearly shows an acute infarct. There is increased signal that may be seen involving the left temporal region on FLAIR imaging but this is questionable. This could represent seizures is real or also possible small cortical stroke. Consequent, the patient's Keppra will be increased to 1000 mg twice a day. We'll continue with antiplatelet agents. We'll continue to follow the patient. She appears her responded well to neuroleptics for the agitation and belligerent behavior. We may consider continuing this. Dementia labs fine. All in all, the patient more likely has had seizures given the suspected left temporal MRI findings. She seemed to respond well to high-dose Keppra. 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 She will be started on olanzapine to help with some of the behavioral issues. If she does okay overnight, she likely can be discharged home tomorrow.    It appears that she still is confused and requires one-to-one sitter observation. The patient refused EEG again yesterday.   GENERAL: She is lying in bed and seems a lot more calmer today than she was on yesterday.  HEENT: Supple. Atraumatic normocephalic.  ABDOMEN: soft  EXTREMITIES: No edema  BACK: Normal.  SKIN: Normal by inspection.  MENTAL STATUS: She is awake and alert. She  appears more responsive and thoughts are more focused and appropriate. She knows that she is in the hospital at [facility]. Does follow commands more briskly. She does not recognizes me however. No perseverations are observed. Again, she had a lot of perseveration over the last few days. CRANIAL NERVES: Pupils are equal, round and reactive to light and accommodation; extra ocular movements are full, there is no significant nystagmus; visual fields are full- though the exam is limited; upper and lower facial muscles are normal in strength and symmetric, there is no flattening of the nasolabial folds; tongue is midline; uvula is midline; shoulder elevation is normal.  MOTOR: Although this is limited, she appears to move both sides well and 5/5 strength in upper and lower extremities. Bulk and tone are generally unremarkable.  COORDINATION: No rest tremor; no intention tremor; no postural tremor; no bradykinesia. There is no dysmetria.        Objective: Vital signs in last 24 hours: Temp:  [98.3 F (36.8 C)-99.4 F (37.4 C)] 98.3 F (36.8 C) (02/03 1153) Pulse Rate:  [121-125] 121 (02/03 1826) Resp:  [17] 17 (02/03 0800) BP: (144-186)/(79-112) 169/103 mmHg (02/03 1826) SpO2:  [97 %] 97 % (02/03 0800)  Intake/Output from previous day: 02/03 0701 - 02/04 0700 In: 110 [IV Piggyback:110] Out: 475 [Urine:475] Intake/Output this shift:   Nutritional status: Cardiac   Lab Results: Results for orders placed during the hospital encounter of 07/06/13 (from the past 48 hour(s))  RPR     Status: None   Collection Time    07/08/13  8:47 AM  Result Value Range   RPR NON REACTIVE  NON REACTIVE   Comment: [facility]  HOMOCYSTEINE     Status: None   Collection Time    07/08/13  8:47 AM      Result Value Range   Homocysteine 9.2  4.0 - 15.4 umol/L   Comment: Performed at [facility]  VITAMIN B12     Status: None   Collection Time    07/08/13  8:47 AM       Result Value Range   Vitamin B-12 706  211 - 911 pg/mL   Comment: [facility]formed at [facility]  SEDIMENTATION RATE     Status: None   Collection Time    07/08/13  8:47 AM      Result Value Range   Sed Rate 3  0 - 22 mm/hr    Lipid Panel No results found for this basename: CHOL, TRIG, HDL, CHOLHDL, VLDL, LDLCALC,  in the last 72 hours  Studies/Results: Mr Brain Wo Contrast  07/07/2013   CLINICAL DATA:  Altered mental status  EXAM: MRI HEAD WITHOUT CONTRAST  TECHNIQUE: Multiplanar, multiecho pulse sequences of the brain and surrounding structures were obtained without intravenous contrast.  COMPARISON:  Head CT T1 2015.  MRI 12/26/2012.  FINDINGS: Diffusion imaging does not show any acute or subacute infarction. The brain shows generalized atrophy. There chronic small vessel changes of the pons and deep white matter. No sign of mass lesion, hemorrhage, hydrocephalus or extra-axial collection. Examination a shortened and motion degraded.  IMPRESSION: Shortened and motion degraded exam. Diffusion imaging does not show any acute or subacute infarction. No other reversible process evident.   Electronically Signed   By: Paulina FusiMark  Shogry M.D.   On: 07/07/2013 19:58    Medications:  Scheduled Meds: . enoxaparin (LOVENOX) injection  40 mg Subcutaneous Q24H  . levETIRAcetam  1,000 mg Intravenous Q12H  . metoprolol tartrate  25 mg Oral BID  . nicotine  21 mg Transdermal Daily  . nitroGLYCERIN  1 inch Topical Q6H  . sodium chloride  3 mL Intravenous Q12H  . thiamine  100 mg Oral Daily   Continuous Infusions: . sodium chloride 100 mL/hr at 07/08/13 1225   PRN Meds:.acetaminophen, acetaminophen, hydrALAZINE, LORazepam, ondansetron (ZOFRAN) IV, ondansetron     LOS: 3 days   Jonathin Heinicke A. Gerilyn Pilgrimoonquah, M.D.  Diplomate, Biomedical engineerAmerican Board of Psychiatry and Neurology ( Neurology).

## 2013-07-10 DIAGNOSIS — G35 Multiple sclerosis: Secondary | ICD-10-CM | POA: Diagnosis not present

## 2013-07-10 DIAGNOSIS — G9340 Encephalopathy, unspecified: Secondary | ICD-10-CM | POA: Diagnosis not present

## 2013-07-10 DIAGNOSIS — I10 Essential (primary) hypertension: Secondary | ICD-10-CM | POA: Diagnosis not present

## 2013-07-10 DIAGNOSIS — R569 Unspecified convulsions: Secondary | ICD-10-CM | POA: Diagnosis not present

## 2013-07-10 DIAGNOSIS — R4182 Altered mental status, unspecified: Secondary | ICD-10-CM | POA: Diagnosis not present

## 2013-07-10 MED ORDER — THIAMINE HCL 100 MG PO TABS
100.0000 mg | ORAL_TABLET | Freq: Every day | ORAL | Status: AC
Start: 2013-07-10 — End: ?

## 2013-07-10 MED ORDER — OLANZAPINE 7.5 MG PO TABS: 7.5000 mg | ORAL_TABLET | Freq: Every day | ORAL | Status: DC

## 2013-07-10 MED ORDER — LEVETIRACETAM 500 MG PO TABS: 1000.0000 mg | ORAL_TABLET | Freq: Two times a day (BID) | ORAL | Status: DC

## 2013-07-10 NOTE — Clinical Social Work Note (Signed)
Pt d/c today. Spoke with pt's sister, [name] who also works at facility. Pt confused. She verified pt's password and states she can come pick up pt soon. Reports no need to call [name]. Informed [name] that pt's scripts were sent to [facility]. D/c summary and FL2 faxed.  Cameron Ali BSW intern

## 2013-07-10 NOTE — Progress Notes (Signed)
Pt's caregiver given d/c instructions, prescriptions called into [facility] pharmacy.  Discussed all home medications (when, how, and why to take), patient verbalizes understanding. Discussed home care with patient and caregiver, teachback completed. F/U appointment in place with Dr Felecia ShellingFanta, pt states they will keep appointment. Pt is stable at this time. Pt taken to main entrance in wheelchair by staff member.

## 2013-07-10 NOTE — Discharge Summary (Signed)
Physician Discharge Summary  Patient ID: Tami AmassSharon L Bogacki MRN: [id number] DOB/AGE: 54/08/1960 54 y.o. Primary Care Physician:Pluma Diniz, MD Admit date: 07/06/2013 Discharge date: 07/10/2013    Discharge Diagnoses:   Principal Problem:   Acute encephalopathy Active Problems:   History of multiple sclerosis   Seizure disorder   Sinus tachycardia     Medication List         AMPYRA 10 MG Tb12  Generic drug:  dalfampridine  Take 10 mg by mouth 2 (two) times daily.     aspirin 325 MG EC tablet  Take 325 mg by mouth daily.     cyclobenzaprine 10 MG tablet  Commonly known as:  FLEXERIL  Take 1 tablet (10 mg total) by mouth 2 (two) times daily.     HYDROcodone-acetaminophen 5-325 MG per tablet  Commonly known as:  NORCO/VICODIN  Take 1 tablet by mouth every 4 (four) hours as needed. Pain     levETIRAcetam 500 MG tablet  Commonly known as:  KEPPRA  Take 2 tablets (1,000 mg total) by mouth 2 (two) times daily.     loratadine 10 MG tablet  Commonly known as:  CLARITIN  Take 1 tablet (10 mg total) by mouth daily.     LORazepam 2 MG tablet  Commonly known as:  ATIVAN  Take 1 tablet (2 mg total) by mouth every 4 (four) hours as needed for anxiety.     metoprolol tartrate 25 MG tablet  Commonly known as:  LOPRESSOR  Take 1 tablet (25 mg total) by mouth 2 (two) times daily.     montelukast 10 MG tablet  Commonly known as:  SINGULAIR  Take 1 tablet (10 mg total) by mouth at bedtime.     multivitamin with minerals Tabs tablet  Take 1 tablet by mouth daily.     OLANZapine 7.5 MG tablet  Commonly known as:  ZYPREXA  Take 1 tablet (7.5 mg total) by mouth at bedtime.     simvastatin 40 MG tablet  Commonly known as:  ZOCOR  Take 1 tablet (40 mg total) by mouth at bedtime.     thiamine 100 MG tablet  Take 1 tablet (100 mg total) by mouth daily.     traZODone 100 MG tablet  Commonly known as:  DESYREL  Take 200 mg by mouth at bedtime.     venlafaxine XR 150 MG 24 hr  capsule  Commonly known as:  EFFEXOR-XR  Take 1 capsule (150 mg total) by mouth daily.     venlafaxine XR 75 MG 24 hr capsule  Commonly known as:  EFFEXOR-XR  Take 75 mg by mouth daily. Taken with 150mg  capsule        Discharged Condition: improved    Consults: Neurology  Significant Diagnostic Studies: Dg Chest 1 View  07/06/2013   CLINICAL DATA:  Altered mental status.  EXAM: CHEST - 1 VIEW  COMPARISON:  Single view of the chest 12/26/2012.  FINDINGS: There is some discoid atelectasis in the left lung base. Mild subsegmental atelectasis is also seen in the right base. No consolidative process, pneumothorax or effusion. Heart size is normal.  IMPRESSION: No acute finding.   Electronically Signed   By: Drusilla Kannerhomas  Dalessio M.D.   On: 07/06/2013 20:34   Ct Head Wo Contrast  07/06/2013   CLINICAL DATA:  Altered mental status  EXAM: CT HEAD WITHOUT CONTRAST  TECHNIQUE: Contiguous axial images were obtained from the base of the skull through the vertex without intravenous contrast.  COMPARISON:  02/06/2013  FINDINGS: Calvarium is intact. There is significant diffuse atrophy and low attenuation in the deep white matter most consistent with chronic small vessel ischemic change. There is no evidence of vascular territory infarct. There is no evidence of mass. There is no hemorrhage or extra-axial fluid. There is no hydrocephalus.  IMPRESSION: Chronic degenerative changes.  No acute findings.   Electronically Signed   By: Esperanza Heir M.D.   On: 07/06/2013 20:10   Mr Brain Wo Contrast  07/07/2013   CLINICAL DATA:  Altered mental status  EXAM: MRI HEAD WITHOUT CONTRAST  TECHNIQUE: Multiplanar, multiecho pulse sequences of the brain and surrounding structures were obtained without intravenous contrast.  COMPARISON:  Head CT T1 2015.  MRI 12/26/2012.  FINDINGS: Diffusion imaging does not show any acute or subacute infarction. The brain shows generalized atrophy. There chronic small vessel changes of the  pons and deep white matter. No sign of mass lesion, hemorrhage, hydrocephalus or extra-axial collection. Examination a shortened and motion degraded.  IMPRESSION: Shortened and motion degraded exam. Diffusion imaging does not show any acute or subacute infarction. No other reversible process evident.   Electronically Signed   By: Paulina Fusi M.D.   On: 07/07/2013 19:58    Lab Results: Basic Metabolic Panel: No results found for this basename: NA, K, CL, CO2, GLUCOSE, BUN, CREATININE, CALCIUM, MG, PHOS,  in the last 72 hours Liver Function Tests: No results found for this basename: AST, ALT, ALKPHOS, BILITOT, PROT, ALBUMIN,  in the last 72 hours   CBC: No results found for this basename: WBC, NEUTROABS, HGB, HCT, MCV, PLT,  in the last 72 hours  Recent Results (from the past 240 hour(s))  MRSA PCR SCREENING     Status: None   Collection Time    07/06/13 11:16 PM      Result Value Range Status   MRSA by PCR NEGATIVE  NEGATIVE Final   Comment:            The GeneXpert MRSA Assay (FDA     approved for NASAL specimens     only), is one component of a     comprehensive MRSA colonization     surveillance program. It is not     intended to diagnose MRSA     infection nor to guide or     monitor treatment for     MRSA infections.     Hospital Course:  This is a 53 years old female with history of multiple medical illnesses was admitted due to acute encephalopathy. She was evaluated by neurology. MRI was done but it had a lot movement . However it didn't show any acute finding. Patient refused to have EEG. She was started on Zyprexa. She is more alert and calmer. Patient will be discharged to assisted living.  Discharge Exam: Blood pressure 140/109, pulse 104, temperature 98.6 F (37 C), temperature source Oral, resp. rate 20, height 5\' 8"  (1.727 m), weight 91.6 kg (201 lb 15.1 oz), SpO2 96.00%.   Disposition:  Assisted living        Follow-up Information   Follow up with  [facility], MD In 2 weeks.   Specialty:  Internal Medicine   Contact information:   [address] [place] [address] [phone]       Signed: [name]  07/10/2013, 7:45 AM

## 2013-07-10 NOTE — Progress Notes (Signed)
Patient ID: Tami Nolan, female   DOB: 1961-04-15, 53 y.o.   MRN: [id number]  [facility] NEUROLOGY Tami Nolan A. Tami Pilgrim, MD     [email or web address]          Tami Nolan is an 53 y.o. female.   Assessment/Plan: 1. Acute encephalopathy/altered mental status. This is suspected of likely recurrent seizures in the left temporal region given the MRI findings. Other workup has been negative. She refuses EEG however. She has gotten better with the High-dose Keppra and the olanzapine.  MS- MEDS Continue tecfireda but STOP  AMPYRA.   There is increased signal that may be seen involving the left temporal region on FLAIR imaging but this is questionable.    The patient had a calmer night. She is awake and alert. She is in a wheelchair and getting ready for discharge.     Objective: Vital signs in last 24 hours: Temp:  [97.4 F (36.3 C)-98.6 F (37 C)] 98.6 F (37 C) (02/05 0544) Pulse Rate:  [96-125] 104 (02/05 0544) Resp:  [20] 20 (02/05 0544) BP: (140-180)/(95-109) 140/109 mmHg (02/05 0544) SpO2:  [96 %-100 %] 96 % (02/05 0544)  Intake/Output from previous day: 02/04 0701 - 02/05 0700 In: 340 [P.O.:340] Out: -  Intake/Output this shift:   Nutritional status: Cardiac   Lab Results: Results for orders placed during the hospital encounter of 07/06/13 (from the past 48 hour(s))  RPR     Status: None   Collection Time    07/08/13  8:47 AM      Result Value Range   RPR NON REACTIVE  NON REACTIVE   Comment: Performed at [facility]  HOMOCYSTEINE     Status: None   Collection Time    07/08/13  8:47 AM      Result Value Range   Homocysteine 9.2  4.0 - 15.4 umol/L   Comment: Performed at [facility]  VITAMIN B12     Status: None   Collection Time    07/08/13  8:47 AM      Result Value Range   Vitamin B-12 706  211 - 911 pg/mL   Comment: Performed at [facility]  SEDIMENTATION RATE     Status: None   Collection Time    07/08/13  8:47 AM   Result Value Range   Sed Rate 3  0 - 22 mm/hr    Lipid Panel No results found for this basename: CHOL, TRIG, HDL, CHOLHDL, VLDL, LDLCALC,  in the last 72 hours  Studies/Results: No results found.  Medications:  Scheduled Meds: . enoxaparin (LOVENOX) injection  40 mg Subcutaneous Q24H  . levETIRAcetam  1,000 mg Oral BID  . metoprolol tartrate  25 mg Oral BID  . nicotine  21 mg Transdermal Daily  . nitroGLYCERIN  1 inch Topical Q6H  . OLANZapine  7.5 mg Oral QHS  . sodium chloride  3 mL Intravenous Q12H  . thiamine  100 mg Oral Daily   Continuous Infusions: . sodium chloride Stopped (07/09/13 1122)   PRN Meds:.acetaminophen, acetaminophen, hydrALAZINE, LORazepam, LORazepam, ondansetron (ZOFRAN) IV, ondansetron     LOS: 4 days   Tami Nolan A. Tami Nolan, M.D.  Diplomate, Biomedical engineer of Psychiatry and Neurology ( Neurology).

## 2013-07-11 ENCOUNTER — Other Ambulatory Visit (HOSPITAL_COMMUNITY): Payer: Medicare Other

## 2013-07-29 ENCOUNTER — Inpatient Hospital Stay (HOSPITAL_COMMUNITY): Admission: RE | Admit: 2013-07-29 | Payer: Medicare Other | Source: Ambulatory Visit

## 2013-08-12 ENCOUNTER — Ambulatory Visit (HOSPITAL_COMMUNITY)
Admission: RE | Admit: 2013-08-12 | Discharge: 2013-08-12 | Disposition: A | Discharge status: Home / Self Care | Payer: Medicare Other | Source: Ambulatory Visit | Attending: Neurology | Admitting: Neurology

## 2013-08-12 DIAGNOSIS — R569 Unspecified convulsions: Secondary | ICD-10-CM | POA: Diagnosis not present

## 2013-08-12 DIAGNOSIS — I498 Other specified cardiac arrhythmias: Secondary | ICD-10-CM | POA: Insufficient documentation

## 2013-08-12 NOTE — Progress Notes (Signed)
EEG Completed; Results Pending  

## 2013-08-13 ENCOUNTER — Encounter: Payer: Self-pay | Admitting: Neurology

## 2013-08-13 NOTE — Procedures (Signed)
  [facility] Tami Taher A. Gerilyn Pilgrimoonquah, MD     [email or web address]           HISTORY: This is a 53 year old lady who presents with seizures.  MEDICATIONS: Scheduled Meds: Continuous Infusions: PRN Meds:.    Prior to Admission medications   Medication Sig Start Date End Date Taking? Authorizing Provider  aspirin 325 MG EC tablet Take 325 mg by mouth daily.    Historical Provider, MD  cyclobenzaprine (FLEXERIL) 10 MG tablet Take 1 tablet (10 mg total) by mouth 2 (two) times daily. 08/02/12   Vida RollerBrian D Miller, MD  dalfampridine (AMPYRA) 10 MG TB12 Take 10 mg by mouth 2 (two) times daily.    Historical Provider, MD  HYDROcodone-acetaminophen (NORCO/VICODIN) 5-325 MG per tablet Take 1 tablet by mouth every 4 (four) hours as needed. Pain 08/02/12   Vida RollerBrian D Miller, MD  levETIRAcetam (KEPPRA) 500 MG tablet Take 2 tablets (1,000 mg total) by mouth 2 (two) times daily. 07/10/13   Avon Gullyesfaye Fanta, MD  loratadine (CLARITIN) 10 MG tablet Take 1 tablet (10 mg total) by mouth daily. 08/02/12   Vida RollerBrian D Miller, MD  LORazepam (ATIVAN) 2 MG tablet Take 1 tablet (2 mg total) by mouth every 4 (four) hours as needed for anxiety. 08/02/12   Vida RollerBrian D Miller, MD  metoprolol tartrate (LOPRESSOR) 25 MG tablet Take 1 tablet (25 mg total) by mouth 2 (two) times daily. 08/02/12   Vida RollerBrian D Miller, MD  montelukast (SINGULAIR) 10 MG tablet Take 1 tablet (10 mg total) by mouth at bedtime. 08/02/12   Vida RollerBrian D Miller, MD  Multiple Vitamin (MULTIVITAMIN WITH MINERALS) TABS Take 1 tablet by mouth daily. 08/02/12   Vida RollerBrian D Miller, MD  OLANZapine (ZYPREXA) 7.5 MG tablet Take 1 tablet (7.5 mg total) by mouth at bedtime. 07/10/13   Avon Gullyesfaye Fanta, MD  simvastatin (ZOCOR) 40 MG tablet Take 1 tablet (40 mg total) by mouth at bedtime. 08/02/12   Vida RollerBrian D Miller, MD  thiamine 100 MG tablet Take 1 tablet (100 mg total) by mouth daily. 07/10/13   Avon Gullyesfaye Fanta, MD  traZODone (DESYREL) 100 MG tablet Take 200 mg by mouth at bedtime. 08/02/12   Vida RollerBrian D  Miller, MD  venlafaxine XR (EFFEXOR-XR) 150 MG 24 hr capsule Take 1 capsule (150 mg total) by mouth daily. 08/02/12   Vida RollerBrian D Miller, MD  venlafaxine XR (EFFEXOR-XR) 75 MG 24 hr capsule Take 75 mg by mouth daily. Taken with 150mg  capsule    Historical Provider, MD      ANALYSIS: A 16 channel recording using standard 10 20 measurements is conducted for 22 minutes. There is a posterior dominant rhythm of 10/2-11 Hz which attenuates with opening and there is beta activity observed in the frontal areas. Awake and drowsy activities are observed. The patient has multiple episodes of sharp wave activity with phase reverses at P3. No electrographic seizures are observed however. Photic simulation and hyperventilation were not carried out. There are no focal or lateralized slowing.   IMPRESSION: 1. 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      Tami Nolan A. Gerilyn Nolan, M.D.  Diplomate, Biomedical engineerAmerican Board of Psychiatry and Neurology ( Neurology).

## 2013-08-18 DIAGNOSIS — G35 Multiple sclerosis: Secondary | ICD-10-CM | POA: Diagnosis not present

## 2013-08-18 DIAGNOSIS — I6789 Other cerebrovascular disease: Secondary | ICD-10-CM | POA: Diagnosis not present

## 2013-08-18 DIAGNOSIS — R569 Unspecified convulsions: Secondary | ICD-10-CM | POA: Diagnosis not present

## 2013-08-18 DIAGNOSIS — I10 Essential (primary) hypertension: Secondary | ICD-10-CM | POA: Diagnosis not present

## 2013-08-25 DIAGNOSIS — Z79899 Other long term (current) drug therapy: Secondary | ICD-10-CM | POA: Diagnosis not present

## 2013-08-25 DIAGNOSIS — G35 Multiple sclerosis: Secondary | ICD-10-CM | POA: Diagnosis not present

## 2013-08-25 DIAGNOSIS — R269 Unspecified abnormalities of gait and mobility: Secondary | ICD-10-CM | POA: Diagnosis not present

## 2013-08-25 DIAGNOSIS — R609 Edema, unspecified: Secondary | ICD-10-CM | POA: Diagnosis not present

## 2013-09-10 DIAGNOSIS — H25019 Cortical age-related cataract, unspecified eye: Secondary | ICD-10-CM | POA: Diagnosis not present

## 2013-09-15 DIAGNOSIS — G35 Multiple sclerosis: Secondary | ICD-10-CM | POA: Diagnosis not present

## 2013-09-15 DIAGNOSIS — R569 Unspecified convulsions: Secondary | ICD-10-CM | POA: Diagnosis not present

## 2013-09-15 DIAGNOSIS — I10 Essential (primary) hypertension: Secondary | ICD-10-CM | POA: Diagnosis not present

## 2013-09-15 DIAGNOSIS — I6789 Other cerebrovascular disease: Secondary | ICD-10-CM | POA: Diagnosis not present

## 2013-09-24 DIAGNOSIS — F329 Major depressive disorder, single episode, unspecified: Secondary | ICD-10-CM | POA: Diagnosis not present

## 2013-09-24 DIAGNOSIS — F3289 Other specified depressive episodes: Secondary | ICD-10-CM | POA: Diagnosis not present

## 2013-10-13 DIAGNOSIS — G35 Multiple sclerosis: Secondary | ICD-10-CM | POA: Diagnosis not present

## 2013-10-13 DIAGNOSIS — I6789 Other cerebrovascular disease: Secondary | ICD-10-CM | POA: Diagnosis not present

## 2013-10-13 DIAGNOSIS — I10 Essential (primary) hypertension: Secondary | ICD-10-CM | POA: Diagnosis not present

## 2013-10-16 DIAGNOSIS — Z5181 Encounter for therapeutic drug level monitoring: Secondary | ICD-10-CM | POA: Diagnosis not present

## 2013-10-16 DIAGNOSIS — Z79899 Other long term (current) drug therapy: Secondary | ICD-10-CM | POA: Diagnosis not present

## 2013-11-12 DIAGNOSIS — R569 Unspecified convulsions: Secondary | ICD-10-CM | POA: Diagnosis not present

## 2013-11-12 DIAGNOSIS — I10 Essential (primary) hypertension: Secondary | ICD-10-CM | POA: Diagnosis not present

## 2013-11-12 DIAGNOSIS — G8929 Other chronic pain: Secondary | ICD-10-CM | POA: Diagnosis not present

## 2013-12-01 DIAGNOSIS — R269 Unspecified abnormalities of gait and mobility: Secondary | ICD-10-CM | POA: Diagnosis not present

## 2013-12-01 DIAGNOSIS — R569 Unspecified convulsions: Secondary | ICD-10-CM | POA: Diagnosis not present

## 2013-12-01 DIAGNOSIS — G35 Multiple sclerosis: Secondary | ICD-10-CM | POA: Diagnosis not present

## 2013-12-01 DIAGNOSIS — Z79899 Other long term (current) drug therapy: Secondary | ICD-10-CM | POA: Diagnosis not present

## 2013-12-09 DIAGNOSIS — G35 Multiple sclerosis: Secondary | ICD-10-CM | POA: Diagnosis not present

## 2013-12-09 DIAGNOSIS — R609 Edema, unspecified: Secondary | ICD-10-CM | POA: Diagnosis not present

## 2013-12-10 DIAGNOSIS — G35 Multiple sclerosis: Secondary | ICD-10-CM | POA: Diagnosis not present

## 2013-12-10 DIAGNOSIS — G8929 Other chronic pain: Secondary | ICD-10-CM | POA: Diagnosis not present

## 2013-12-10 DIAGNOSIS — I69949 Monoplegia of lower limb following unspecified cerebrovascular disease affecting unspecified side: Secondary | ICD-10-CM | POA: Diagnosis not present

## 2013-12-10 DIAGNOSIS — R569 Unspecified convulsions: Secondary | ICD-10-CM | POA: Diagnosis not present

## 2013-12-15 DIAGNOSIS — F329 Major depressive disorder, single episode, unspecified: Secondary | ICD-10-CM | POA: Diagnosis not present

## 2013-12-15 DIAGNOSIS — F3289 Other specified depressive episodes: Secondary | ICD-10-CM | POA: Diagnosis not present

## 2014-01-15 ENCOUNTER — Other Ambulatory Visit (HOSPITAL_COMMUNITY): Payer: Self-pay | Admitting: Internal Medicine

## 2014-01-15 DIAGNOSIS — G35 Multiple sclerosis: Secondary | ICD-10-CM | POA: Diagnosis not present

## 2014-01-15 DIAGNOSIS — I69949 Monoplegia of lower limb following unspecified cerebrovascular disease affecting unspecified side: Secondary | ICD-10-CM | POA: Diagnosis not present

## 2014-01-15 DIAGNOSIS — R569 Unspecified convulsions: Secondary | ICD-10-CM | POA: Diagnosis not present

## 2014-01-15 DIAGNOSIS — Z0000 Encounter for general adult medical examination without abnormal findings: Secondary | ICD-10-CM | POA: Diagnosis not present

## 2014-01-15 DIAGNOSIS — E7800 Pure hypercholesterolemia, unspecified: Secondary | ICD-10-CM | POA: Diagnosis not present

## 2014-01-15 DIAGNOSIS — I10 Essential (primary) hypertension: Secondary | ICD-10-CM | POA: Diagnosis not present

## 2014-01-15 DIAGNOSIS — R7309 Other abnormal glucose: Secondary | ICD-10-CM | POA: Diagnosis not present

## 2014-01-21 ENCOUNTER — Telehealth: Payer: Self-pay

## 2014-01-21 NOTE — Telephone Encounter (Signed)
Pt was referred by Dr. Felecia Shelling for screening colonoscopy. She left VM that she is ready to schedule. I called and someone picked up but did not speak, bad connection.  I called back, This is [facility] and Zella Ball has left for the day.  They will have her call back tomorrow.

## 2014-01-29 ENCOUNTER — Other Ambulatory Visit (HOSPITAL_COMMUNITY)
Admission: RE | Admit: 2014-01-29 | Discharge: 2014-01-29 | Disposition: A | Discharge status: Home / Self Care | Payer: Medicare Other | Source: Ambulatory Visit | Attending: Obstetrics & Gynecology | Admitting: Obstetrics & Gynecology

## 2014-01-29 ENCOUNTER — Ambulatory Visit (INDEPENDENT_AMBULATORY_CARE_PROVIDER_SITE_OTHER): Payer: Medicare Other | Admitting: Obstetrics & Gynecology

## 2014-01-29 ENCOUNTER — Encounter: Payer: Self-pay | Admitting: Obstetrics & Gynecology

## 2014-01-29 VITALS — BP 150/110

## 2014-01-29 DIAGNOSIS — Z01419 Encounter for gynecological examination (general) (routine) without abnormal findings: Secondary | ICD-10-CM

## 2014-01-29 DIAGNOSIS — Z1212 Encounter for screening for malignant neoplasm of rectum: Secondary | ICD-10-CM | POA: Diagnosis not present

## 2014-01-29 DIAGNOSIS — Z124 Encounter for screening for malignant neoplasm of cervix: Secondary | ICD-10-CM | POA: Insufficient documentation

## 2014-01-29 DIAGNOSIS — Z1151 Encounter for screening for human papillomavirus (HPV): Secondary | ICD-10-CM | POA: Diagnosis not present

## 2014-01-29 NOTE — Addendum Note (Signed)
Addended by: Criss Alvine on: 01/29/2014 12:21 PM   Modules accepted: Orders

## 2014-01-29 NOTE — Progress Notes (Signed)
Patient ID: Tami Nolan, female   DOB: 04/22/1961, 53 y.o.   MRN: [id number] Subjective:     Tami Nolan is a 53 y.o. female here for a routine exam.  No LMP recorded. Patient is postmenopausal. No obstetric history on file. Birth Control Method:  na Menstrual Calendar(currently): amenorrheic  Current complaints: na.   Current acute medical issues:  See PMH   Recent Gynecologic History No LMP recorded. Patient is postmenopausal. Last Pap: ?,  ? Last mammogram: 2014,  normal  Past Medical History  Diagnosis Date  . Hepatitis     ETOH related   . ETOH abuse   . Hx of abnormal cervical Pap smear 1997  . Colon polyps 1997  . Genital warts   . Multiple sclerosis   . Hypokalemia   . Rhabdomyolysis   . Anxiety   . Stroke   . History of electroencephalogram 08/2012    normal    Past Surgical History  Procedure Laterality Date  . Bilatetral tubal ligation      OB History   Grav Para Term Preterm Abortions TAB SAB Ect Mult Living                  History   Social History  . Marital Status: Divorced    Spouse Name: N/A    Number of Children: N/A  . Years of Education: N/A   Social History Main Topics  . Smoking status: Current Every Day Smoker -- 1.00 packs/day for 15 years    Types: Cigarettes  . Smokeless tobacco: Current User  . Alcohol Use: No     Comment: Hx of ETOH abuse - dry since 1997.  . Drug Use: No     Comment: No hx of illicit drugs   . Sexual Activity: Not Currently   Other Topics Concern  . None   Social History Narrative  . None    Family History  Problem Relation Age of Onset  . Coronary artery disease Brother   . Diabetes Brother   . Hypertension Brother   . Hypertension Mother   . Diabetes Mother   . Hypertension Sister   . Alcohol abuse      family history      Review of Systems  Review of Systems  Constitutional: Negative for fever, chills, weight loss, malaise/fatigue and diaphoresis.  HENT: Negative for hearing loss,  ear pain, nosebleeds, congestion, sore throat, neck pain, tinnitus and ear discharge.   Eyes: Negative for blurred vision, double vision, photophobia, pain, discharge and redness.  Respiratory: Negative for cough, hemoptysis, sputum production, shortness of breath, wheezing and stridor.   Cardiovascular: Negative for chest pain, palpitations, orthopnea, claudication, leg swelling and PND.  Gastrointestinal: negative for abdominal pain. Negative for heartburn, nausea, vomiting, diarrhea, constipation, blood in stool and melena.  Genitourinary: Negative for dysuria, urgency, frequency, hematuria and flank pain.  Musculoskeletal: Negative for myalgias, back pain, joint pain and falls.  Skin: Negative for itching and rash.  Neurological: Negative for dizziness, tingling, tremors, sensory change, speech change, focal weakness, seizures, loss of consciousness, weakness and headaches.  Endo/Heme/Allergies: Negative for environmental allergies and polydipsia. Does not bruise/bleed easily.  Psychiatric/Behavioral: Negative for depression, suicidal ideas, hallucinations, memory loss and substance abuse. The patient is not nervous/anxious and does not have insomnia.        Objective:    Physical Exam  Vitals reviewed. Constitutional: She is oriented to person, place, and time. She appears well-developed and well-nourished.  HENT:  Head: Normocephalic and atraumatic.        Right Ear: External ear normal.  Left Ear: External ear normal.  Nose: Nose normal.  Mouth/Throat: Oropharynx is clear and moist.  Eyes: Conjunctivae and EOM are normal. Pupils are equal, round, and reactive to light. Right eye exhibits no discharge. Left eye exhibits no discharge. No scleral icterus.  Neck: Normal range of motion. Neck supple. No tracheal deviation present. No thyromegaly present.  Cardiovascular: Normal rate, regular rhythm, normal heart sounds and intact distal pulses.  Exam reveals no gallop and no friction  rub.   No murmur heard. Respiratory: Effort normal and breath sounds normal. No respiratory distress. She has no wheezes. She has no rales. She exhibits no tenderness.  GI: Soft. Bowel sounds are normal. She exhibits no distension and no mass. There is no tenderness. There is no rebound and no guarding.  Genitourinary:  Breasts no masses skin changes or nipple changes bilaterally      Vulva is normal without lesions Vagina is pink moist without discharge Cervix normal in appearance and pap is done Uterus is normal size shape and contour Adnexa is negative with normal sized ovaries  Rectal    hemoccult negative, normal tone, no masses  Musculoskeletal: Normal range of motion. She exhibits no edema and no tenderness.  Neurological: She is alert and oriented to person, place, and time. She has normal reflexes. She displays normal reflexes. No cranial nerve deficit. She exhibits normal muscle tone. Coordination normal.  Skin: Skin is warm and dry. No rash noted. No erythema. No pallor.  Psychiatric: She has a normal mood and affect. Her behavior is normal. Judgment and thought content normal.       Assessment:     Normal gyn exam      Plan:    Follow up in: 3 years.

## 2014-02-02 LAB — CYTOLOGY - PAP

## 2014-02-05 NOTE — Telephone Encounter (Signed)
Letter was mailed to pt on 02/02/2014.

## 2014-02-10 ENCOUNTER — Telehealth: Payer: Self-pay

## 2014-02-10 NOTE — Telephone Encounter (Signed)
Adela Lank from [facility] rest [facility] called to get colonoscopy scheduled [phone]

## 2014-02-10 NOTE — Telephone Encounter (Signed)
TRIED TO CALL AND LINE IS BUSY.

## 2014-02-11 NOTE — Telephone Encounter (Signed)
Tried to call, many rings and no answer.  

## 2014-02-17 NOTE — Telephone Encounter (Signed)
I called and spoke to Daine Floras who is the Resident Care Coordinator. I got the triage info and she will fax over a list of medications for pt. She said pt had a stroke a few months ago but is doing well now. She also said the pt is her own guardian and very capable of of signing for herself.

## 2014-02-20 DIAGNOSIS — IMO0002 Reserved for concepts with insufficient information to code with codable children: Secondary | ICD-10-CM | POA: Diagnosis not present

## 2014-02-20 DIAGNOSIS — G35 Multiple sclerosis: Secondary | ICD-10-CM | POA: Diagnosis not present

## 2014-02-20 DIAGNOSIS — G8929 Other chronic pain: Secondary | ICD-10-CM | POA: Diagnosis not present

## 2014-02-20 DIAGNOSIS — M1710 Unilateral primary osteoarthritis, unspecified knee: Secondary | ICD-10-CM | POA: Diagnosis not present

## 2014-02-26 ENCOUNTER — Telehealth: Payer: Self-pay

## 2014-02-26 NOTE — Telephone Encounter (Signed)
Pt is scheduled OV on 03/26/2014 at 2:00 PM with Tana Coast, PA due to meds.

## 2014-02-26 NOTE — Telephone Encounter (Signed)
I spoke to [facility] , Coordinator, and scheduled pt an OV appt on 03/26/2014 at 2:00 PM with Tana Coast, PA. Due to her medications.

## 2014-03-05 DIAGNOSIS — H10423 Simple chronic conjunctivitis, bilateral: Secondary | ICD-10-CM | POA: Diagnosis not present

## 2014-03-18 DIAGNOSIS — I10 Essential (primary) hypertension: Secondary | ICD-10-CM | POA: Diagnosis not present

## 2014-03-18 DIAGNOSIS — I639 Cerebral infarction, unspecified: Secondary | ICD-10-CM | POA: Diagnosis not present

## 2014-03-18 DIAGNOSIS — Z23 Encounter for immunization: Secondary | ICD-10-CM | POA: Diagnosis not present

## 2014-03-18 DIAGNOSIS — G35 Multiple sclerosis: Secondary | ICD-10-CM | POA: Diagnosis not present

## 2014-03-18 DIAGNOSIS — R52 Pain, unspecified: Secondary | ICD-10-CM | POA: Diagnosis not present

## 2014-03-26 ENCOUNTER — Encounter: Payer: Self-pay | Admitting: Gastroenterology

## 2014-03-26 ENCOUNTER — Ambulatory Visit (INDEPENDENT_AMBULATORY_CARE_PROVIDER_SITE_OTHER): Payer: Medicare Other | Admitting: Gastroenterology

## 2014-03-26 ENCOUNTER — Telehealth: Payer: Self-pay

## 2014-03-26 VITALS — BP 149/92 | HR 89 | Temp 96.9°F | Ht 62.0 in

## 2014-03-26 DIAGNOSIS — Z1211 Encounter for screening for malignant neoplasm of colon: Secondary | ICD-10-CM | POA: Diagnosis not present

## 2014-03-26 DIAGNOSIS — K5900 Constipation, unspecified: Secondary | ICD-10-CM

## 2014-03-26 MED ORDER — LINACLOTIDE 145 MCG PO CAPS: 145.0000 ug | ORAL_CAPSULE | Freq: Every day | ORAL | Status: AC

## 2014-03-26 NOTE — Progress Notes (Signed)
Primary Care Physician:  Avon GullyFANTA,TESFAYE, MD  Primary Gastroenterologist:  Roetta SessionsMichael Rourk, MD   Chief Complaint  Patient presents with  . Colonoscopy    HPI:  Tami AmassSharon L Droge is a 53 y.o. female here at the request of Dr. Felecia ShellingFanta for a colonoscopy. Patient is a difficult historian and I question the reliability of her history. [facility] who was unable to provide history.   BM few times per week. No melena, brbpr. No abdominal pain. Appetite good. No heartburn. No dysphagia.   Current Outpatient Prescriptions  Medication Sig Dispense Refill  . aspirin 325 MG EC tablet Take 325 mg by mouth daily.      . cyclobenzaprine (FLEXERIL) 10 MG tablet Take 1 tablet (10 mg total) by mouth 2 (two) times daily.  30 tablet  1  . dalfampridine (AMPYRA) 10 MG TB12 Take 10 mg by mouth 2 (two) times daily.      . Dimethyl Fumarate (TECFIDERA) 240 MG CPDR Take by mouth 2 (two) times daily.      . furosemide (LASIX) 20 MG tablet Take 20 mg by mouth daily.      Marland Kitchen. HYDROcodone-acetaminophen (NORCO/VICODIN) 5-325 MG per tablet Take 1 tablet by mouth 2 (two) times daily. Pain      . ketoconazole (NIZORAL) 2 % cream Apply 1 application topically daily. As directed.      . levETIRAcetam (KEPPRA) 500 MG tablet Take 2 tablets (1,000 mg total) by mouth 2 (two) times daily.  60 tablet  1  . lisinopril (PRINIVIL,ZESTRIL) 20 MG tablet Take 20 mg by mouth daily.      Marland Kitchen. loratadine (CLARITIN) 10 MG tablet Take 1 tablet (10 mg total) by mouth daily.  30 tablet  1  . metoprolol tartrate (LOPRESSOR) 25 MG tablet Take 50 mg by mouth 2 (two) times daily.      . montelukast (SINGULAIR) 10 MG tablet Take 1 tablet (10 mg total) by mouth at bedtime.  30 tablet  1  . Multiple Vitamin (MULTIVITAMIN WITH MINERALS) TABS Take 1 tablet by mouth daily.  30 tablet  1  . naproxen (NAPROSYN) 500 MG tablet Take 500 mg by mouth 2 (two) times daily with a meal.      . sennosides-docusate sodium (SENOKOT-S) 8.6-50 MG  tablet Take 1 tablet by mouth daily. As needed      . simvastatin (ZOCOR) 40 MG tablet Take 1 tablet (40 mg total) by mouth at bedtime.  30 tablet  1  . thiamine 100 MG tablet Take 1 tablet (100 mg total) by mouth daily.  30 tablet  3  . traZODone (DESYREL) 100 MG tablet Take 200 mg by mouth at bedtime.      Marland Kitchen. venlafaxine XR (EFFEXOR-XR) 150 MG 24 hr capsule Take 1 capsule (150 mg total) by mouth daily.  30 capsule  1  . venlafaxine XR (EFFEXOR-XR) 75 MG 24 hr capsule Take 75 mg by mouth daily. Taken with 150mg  capsule      . Linaclotide (LINZESS) 145 MCG CAPS capsule Take 1 capsule (145 mcg total) by mouth daily.  30 capsule  5  . LORazepam (ATIVAN) 2 MG tablet Take 1 tablet (2 mg total) by mouth every 4 (four) hours as needed for anxiety.  30 tablet  0  . OLANZapine (ZYPREXA) 7.5 MG tablet Take 1 tablet (7.5 mg total) by mouth at bedtime.  30 tablet  3   No current facility-administered medications for this visit.    Allergies  as of 03/26/2014  . (No Known Allergies)    Past Medical History  Diagnosis Date  . Hepatitis     ETOH related   . ETOH abuse   . Hx of abnormal cervical Pap smear 1997  . Colon polyps 1997  . Genital warts   . Multiple sclerosis   . Hypokalemia   . Rhabdomyolysis   . Anxiety   . Stroke   . History of electroencephalogram 08/2012    normal    Past Surgical History  Procedure Laterality Date  . Bilatetral tubal ligation      Family History  Problem Relation Age of Onset  . Coronary artery disease Brother   . Diabetes Brother   . Hypertension Brother   . Hypertension Mother   . Diabetes Mother   . Hypertension Sister   . Alcohol abuse      family history     History   Social History  . Marital Status: Divorced    Spouse Name: N/A    Number of Children: 2  . Years of Education: N/A   Occupational History  . Not on file.   Social History Main Topics  . Smoking status: Current Every Day Smoker -- 1.00 packs/day for 15 years    Types:  Cigarettes  . Smokeless tobacco: Current User  . Alcohol Use: No     Comment: Hx of ETOH abuse - dry for a couple of years.   . Drug Use: No     Comment: No hx of illicit drugs   . Sexual Activity: Not Currently   Other Topics Concern  . Not on file   Social History Narrative  . No narrative on file      ROS:  General: Negative for anorexia, weight loss, fever, chills, fatigue, weakness. Eyes: Negative for vision changes.  ENT: Negative for hoarseness, difficulty swallowing , nasal congestion. CV: Negative for chest pain, angina, palpitations, dyspnea on exertion, peripheral edema.  Respiratory: Negative for dyspnea at rest, dyspnea on exertion, cough, sputum, wheezing.  GI: See history of present illness. GU:  Negative for dysuria, hematuria, urinary incontinence, urinary frequency, nocturnal urination.  MS: Negative for joint pain, low back pain.  Derm: Negative for rash or itching.  Neuro: Negative for weakness, abnormal sensation, seizure, frequent headaches, memory loss, confusion.  Psych: Negative for anxiety, depression, suicidal ideation, hallucinations.  Endo: Negative for unusual weight change.  Heme: Negative for bruising or bleeding. Allergy: Negative for rash or hives.    Physical Examination:  BP 149/92  Pulse 89  Temp(Src) 96.9 F (36.1 C) (Oral)  Ht 5\' 2"  (1.575 m)   General: Well-nourished, well-developed in no acute distress.  Head: Normocephalic, atraumatic.   Eyes: Conjunctiva pink, no icterus. Mouth: Oropharyngeal mucosa moist and pink , no lesions erythema or exudate. Neck: Supple without thyromegaly, masses, or lymphadenopathy.  Lungs: Clear to auscultation bilaterally.  Heart: Regular rate and rhythm, no murmurs rubs or gallops.  Abdomen: Bowel sounds are normal, nontender, nondistended, no hepatosplenomegaly or masses, no abdominal bruits or    hernia , no rebound or guarding.  Examined in a wheelchair.  Rectal: not performed Extremities: No  lower extremity edema. No clubbing or deformities.  Neuro: Alert and oriented x 4 , grossly normal neurologically.  Skin: Warm and dry, no rash or jaundice.   Psych: Alert and cooperative, normal mood and affect.  Labs: Lab Results  Component Value Date   WBC 8.0 07/07/2013   HGB 15.5* 07/07/2013  HCT 43.9 07/07/2013   MCV 78.1 07/07/2013   PLT 318 07/07/2013   Lab Results  Component Value Date   CREATININE 0.41* 07/07/2013   BUN 7 07/07/2013   NA 138 07/07/2013   K 3.7 07/07/2013   CL 96 07/07/2013   CO2 27 07/07/2013   Lab Results  Component Value Date   ALT 11 07/07/2013   AST 13 07/07/2013   ALKPHOS 66 07/07/2013   BILITOT 0.3 07/07/2013      Imaging Studies: No results found.

## 2014-03-26 NOTE — Patient Instructions (Addendum)
1. Colonoscopy as scheduled. See separate instructions.  2. Start Linzess daily for constipation. Prescription sent to [facility].

## 2014-03-26 NOTE — Telephone Encounter (Signed)
I spoke with [facility]. They stated the pt does not have a guardian or a POA. The pt signs all documents for herself.

## 2014-03-28 NOTE — Assessment & Plan Note (Signed)
Colonoscopy in near future. She will require deep sedation due to polypharmacy and history of etoh abuse.  I have discussed the risks, alternatives, benefits with regards to but not limited to the risk of reaction to medication, bleeding, infection, perforation and the patient is agreeable to proceed. Written consent to be obtained. Based on interview today, I don't feel patient is able to consent herself. No HCPOA per facility. Consent issues will need to be worked out prior to proceeding with colonoscopy.   Will add Linzess daily for constipation.

## 2014-03-30 DIAGNOSIS — R270 Ataxia, unspecified: Secondary | ICD-10-CM | POA: Diagnosis not present

## 2014-03-30 DIAGNOSIS — Z79899 Other long term (current) drug therapy: Secondary | ICD-10-CM | POA: Diagnosis not present

## 2014-03-30 DIAGNOSIS — R569 Unspecified convulsions: Secondary | ICD-10-CM | POA: Diagnosis not present

## 2014-03-30 DIAGNOSIS — G35 Multiple sclerosis: Secondary | ICD-10-CM | POA: Diagnosis not present

## 2014-03-31 NOTE — Progress Notes (Signed)
cc'ed to pcp °

## 2014-04-02 NOTE — Telephone Encounter (Signed)
I don't feel like patient had a good understanding of what the procedure involves and she was not a very reliable historian during her OV.   Does she have a family member who can consent for her or her caregiver? I'm afraid she would not be allowed to sign herself once she shows up in endo.

## 2014-04-08 NOTE — Telephone Encounter (Signed)
I spoke with the pts brother- Tami Nolan, who lives in [place], he said he was not POA and that his sister, Tami Nolan, was here and was aware of all of her medical issues and for me to call her. I spoke with Tami Nolan, she again told me that the pt does not have a POA and that she signs everything for herself and that either she or one of her daughters would come with her to the hospital. She said she was positive the patient understood what you were telling her. 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

## 2014-04-08 NOTE — Telephone Encounter (Signed)
Can we please follow up on this? Patient was seen 03/26/14 and don't want to miss opportunity to get this scheduled within the 30 day timeframe.

## 2014-04-15 ENCOUNTER — Other Ambulatory Visit: Payer: Self-pay

## 2014-04-15 MED ORDER — FLEET ENEMA 7-19 GM/118ML RE ENEM: 1.0000 | ENEMA | Freq: Once | RECTAL | Status: AC

## 2014-04-15 MED ORDER — PEG-KCL-NACL-NASULF-NA ASC-C 100 G PO SOLR: 1.0000 | ORAL | Status: AC

## 2014-04-15 NOTE — Telephone Encounter (Signed)
Pt is set up on 05/07/14 @ 0700 and her prep-op is 05/04/14 @ 900. Instruction are in the mail

## 2014-04-15 NOTE — Telephone Encounter (Signed)
Done

## 2014-04-15 NOTE — Telephone Encounter (Signed)
Great. Let's get her scheduled with instructions as below.

## 2014-04-15 NOTE — Telephone Encounter (Signed)
Per Dr. Felecia Shelling she is competent to consent for her tcs.

## 2014-04-15 NOTE — Telephone Encounter (Signed)
Can we go ahead and get her on the schedule for TCS in OR (due to polypharmacy and h/o etoh abuse).   Start Dulcolax 10mg  orally daily 3 days before procedure.  2 full days of clear liquids.   I will discuss with Durward MallardCamille to make sure we are good on consent issues.

## 2014-04-15 NOTE — Telephone Encounter (Signed)
Routing to Ginger to schedule  

## 2014-04-15 NOTE — Progress Notes (Signed)
Please see detailed phone noted dated 03/26/14. Per Dr. Felecia ShellingFanta, patient is consentable.

## 2014-04-28 IMAGING — CR DG SHOULDER 2+V*R*
3 series · 3 of 3 positions shown · non-contrast
Comparison: Right shoulder x-rays 02/15/2011.

CLINICAL DATA: Fell and injured right shoulder.

RIGHT SHOULDER - 2+ VIEW

[view not recorded (1 of 3)]
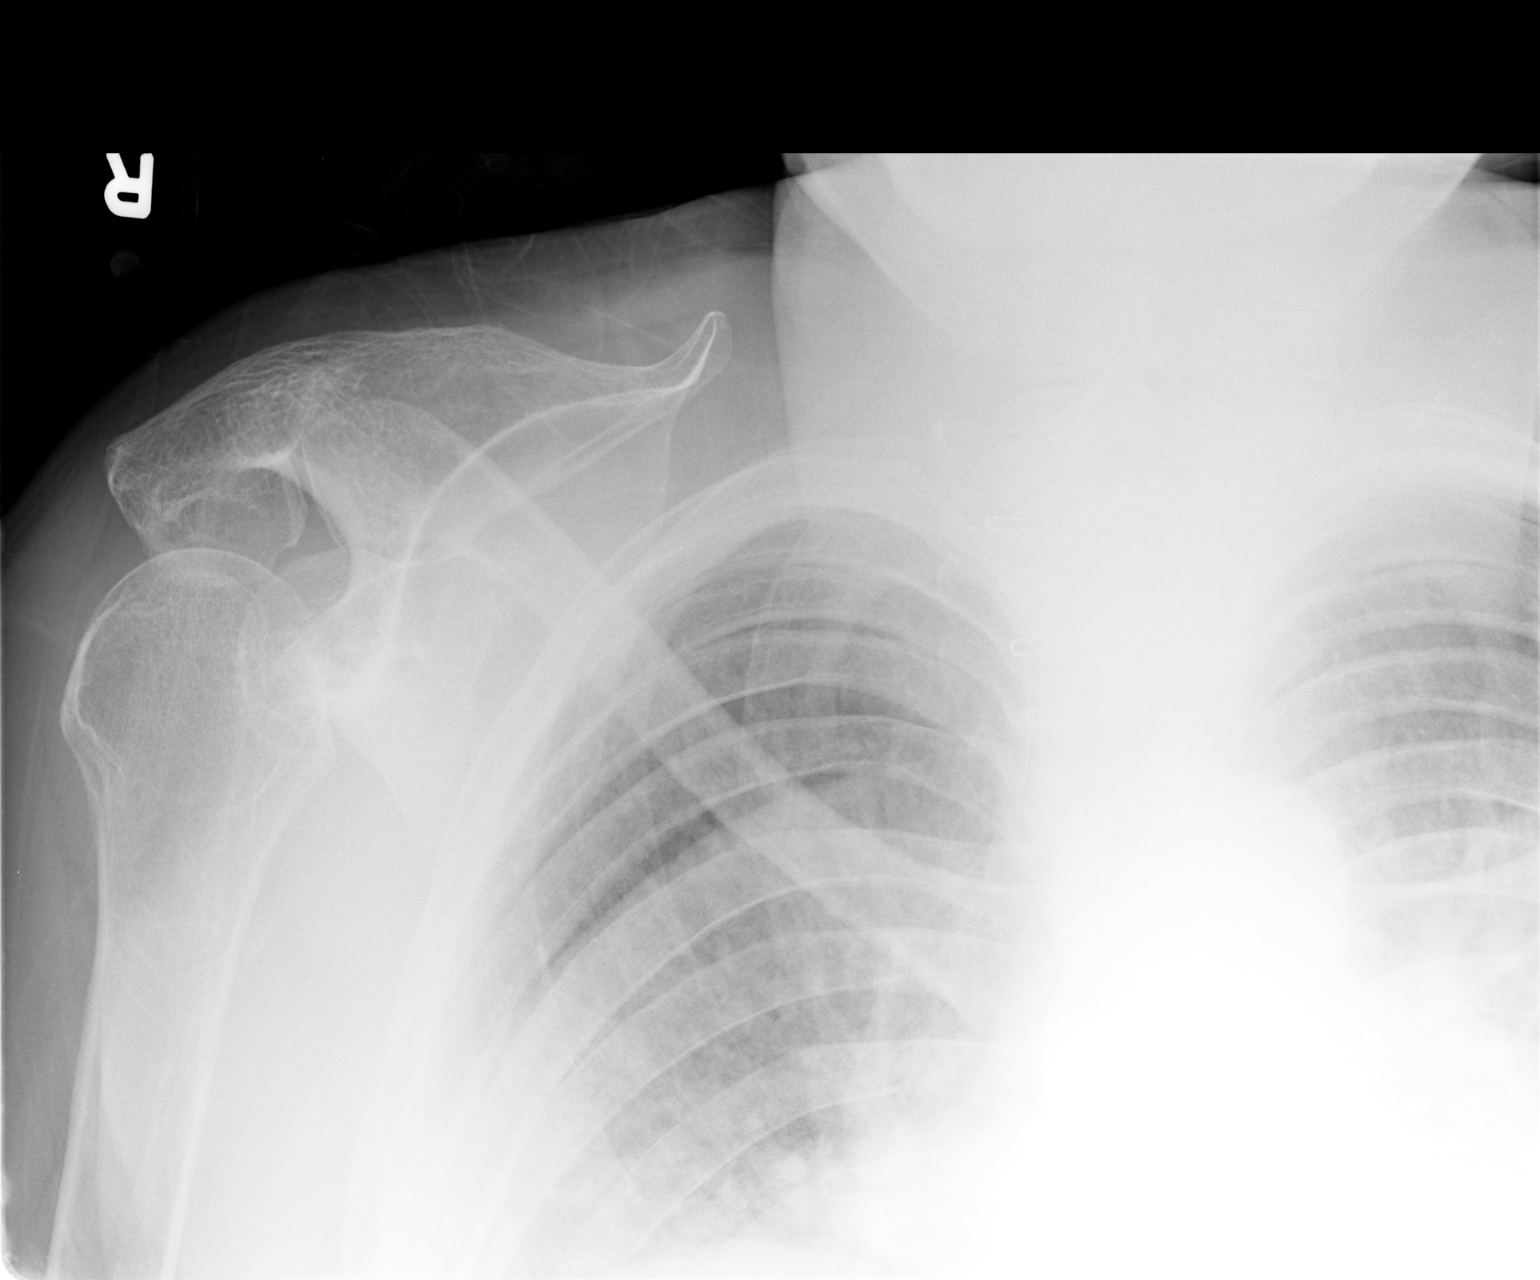

[view not recorded (2 of 3)]
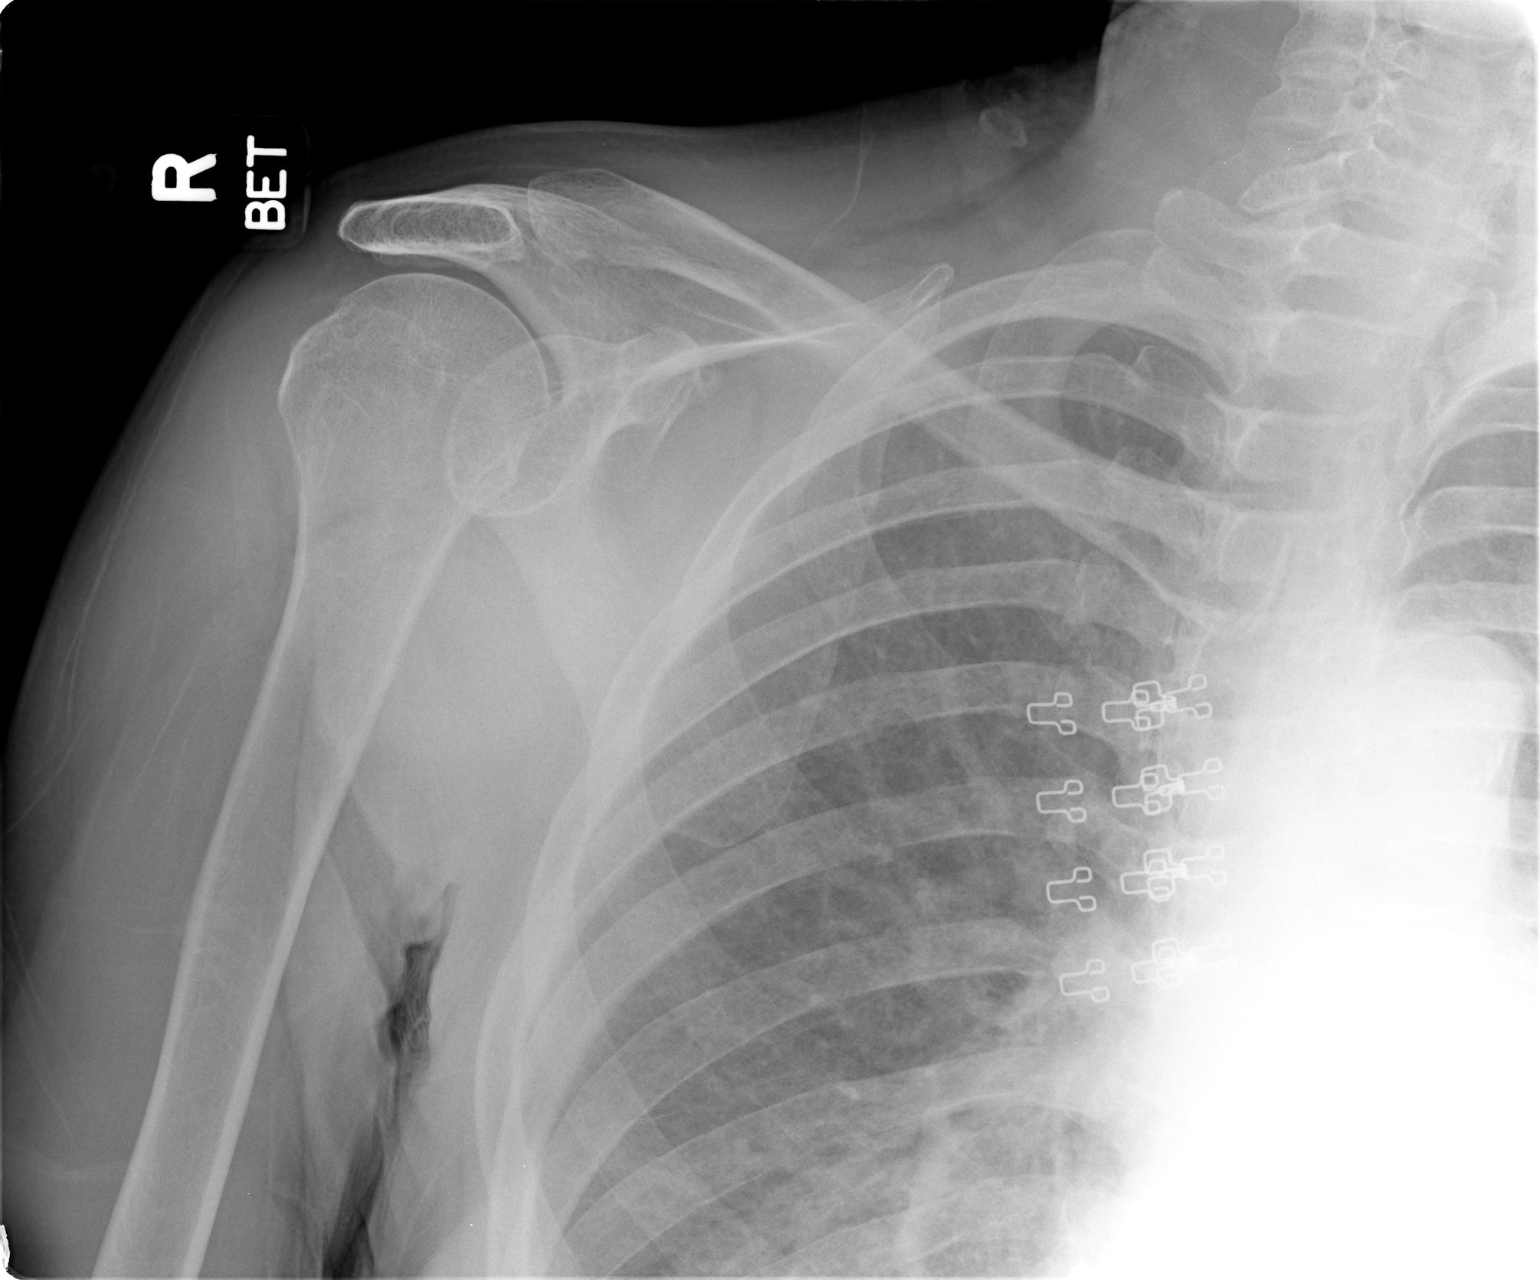

[view not recorded (3 of 3)]
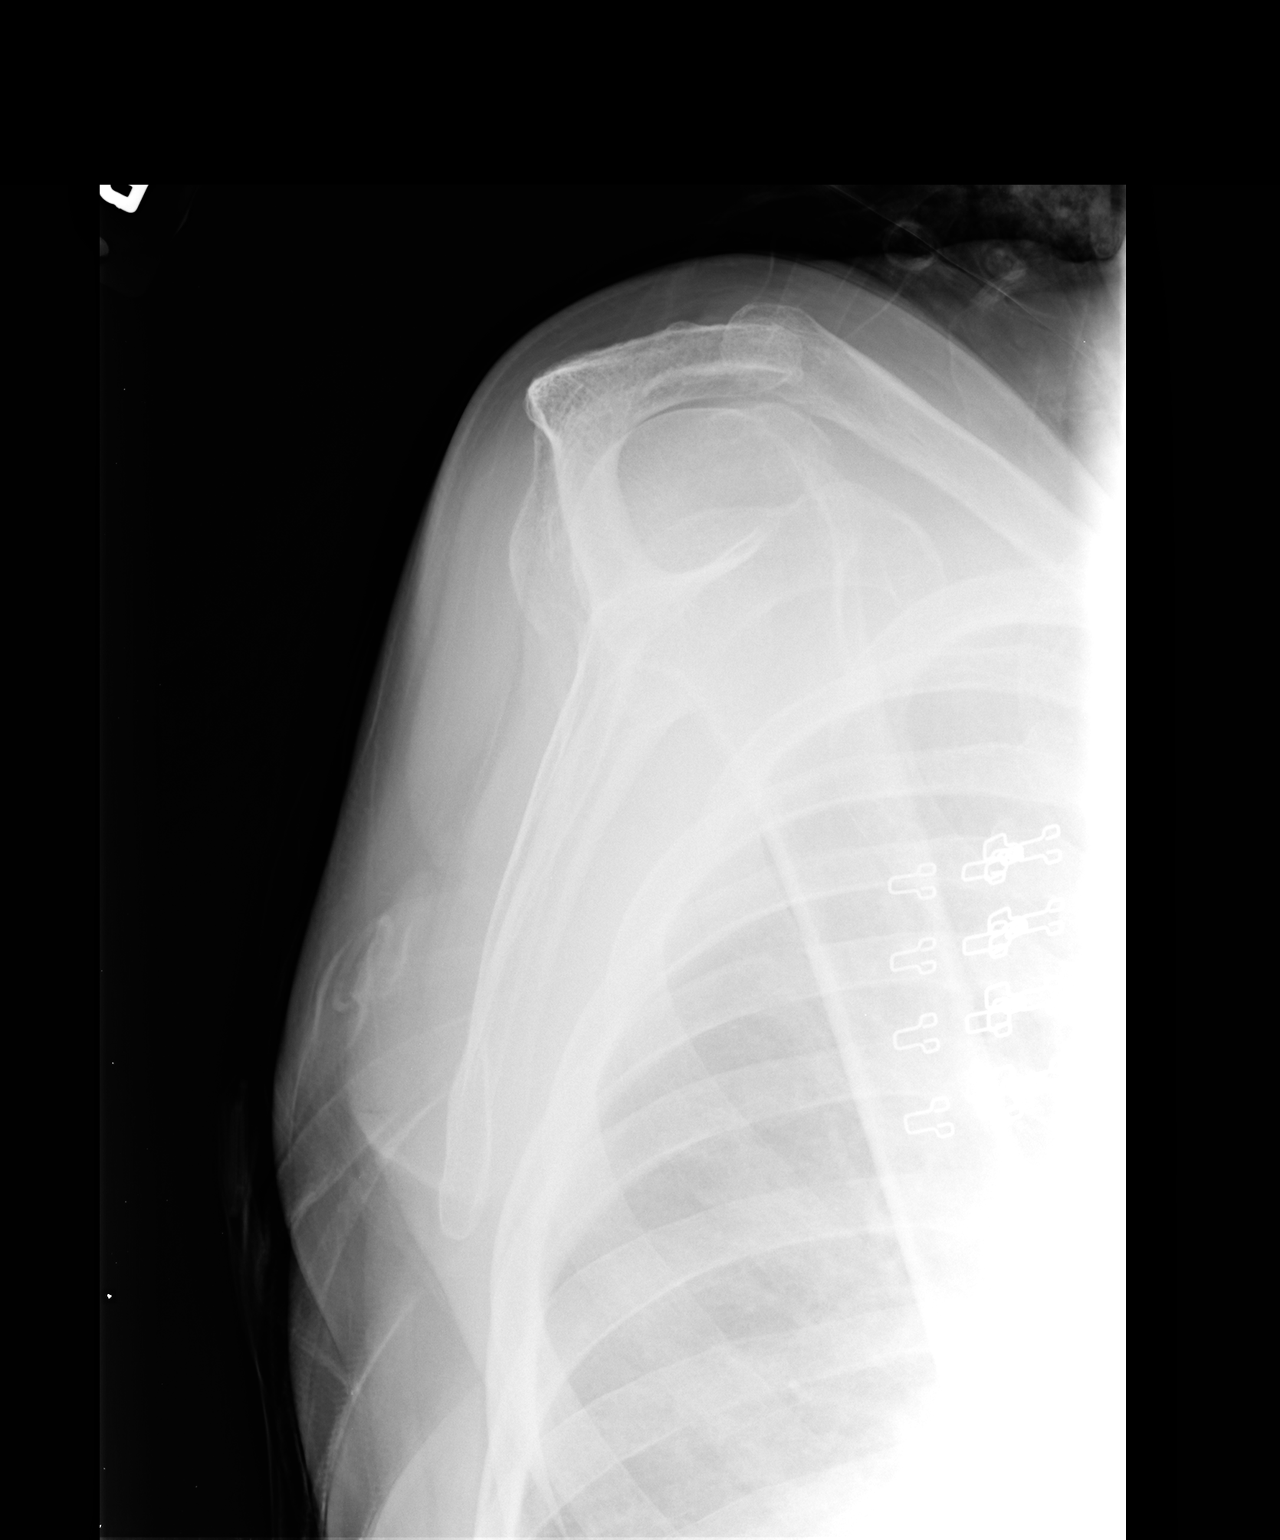

[3 of 3 positions shown; findings below may reference images not displayed]

FINDINGS: No evidence of acute fracture or glenohumeral
dislocation.  Acromioclavicular joint intact without significant
degenerative changes.  Slight narrowing of the subacromial space,
more so than on the prior examination.
IMPRESSION: No acute osseous abnormality.  Slight narrowing of the subacromial
space may indicate chronic supraspinatus tendon disease.

## 2014-04-28 NOTE — Patient Instructions (Signed)
Tami Nolan  04/28/2014   Your procedure is scheduled on:  05/07/2014  Report to [facility] at  630  AM.  Call this number if you have problems the morning of surgery: [phone]   Remember:   Do not eat food or drink liquids after midnight.   Take these medicines the morning of surgery with A SIP OF WATER: flexaril, dalfamprimine, dimethyl fumerate, hudrocodone, keppra, lisinopril, metoprolol, singulair, effexor   Do not wear jewelry, make-up or nail polish.  Do not wear lotions, powders, or perfumes.   Do not shave 48 hours prior to surgery. Men may shave face and neck.  Do not bring valuables to the hospital.  [facility] is not responsible for any belongings or valuables.               Contacts, dentures or bridgework may not be worn into surgery.  Leave suitcase in the car. After surgery it may be brought to your room.  For patients admitted to the hospital, discharge time is determined by your treatment team.               Patients discharged the day of surgery will not be allowed to drive home.  Name and phone number of your driver: family  Special Instructions: Shower using CHG 2 nights before surgery and the night before surgery.  If you shower the day of surgery use CHG.  Use special wash - you have one bottle of CHG for all showers.  You should use approximately 1/3 of the bottle for each shower.   Please read over the following fact sheets that you were given: Pain Booklet, Coughing and Deep Breathing, Surgical Site Infection Prevention, Anesthesia Post-op Instructions and Care and Recovery After Surgery Colonoscopy A colonoscopy is an exam to look at the entire large intestine (colon). This exam can help find problems such as tumors, polyps, inflammation, and areas of bleeding. The exam takes about 1 hour.  LET [facility] CARE PROVIDER KNOW ABOUT:   Any allergies you have.  All medicines you are taking, including vitamins, herbs, eye drops, creams, and  over-the-counter medicines.  Previous problems you or members of your family have had with the use of anesthetics.  Any blood disorders you have.  Previous surgeries you have had.  Medical conditions you have. RISKS AND COMPLICATIONS  Generally, this is a safe procedure. However, as with any procedure, complications can occur. Possible complications include:  Bleeding.  Tearing or rupture of the colon wall.  Reaction to medicines given during the exam.  Infection (rare). BEFORE THE PROCEDURE   Ask your health care provider about changing or stopping your regular medicines.  You may be prescribed an oral bowel prep. This involves drinking a large amount of medicated liquid, starting the day before your procedure. The liquid will cause you to have multiple loose stools until your stool is almost clear or light green. This cleans out your colon in preparation for the procedure.  Do not eat or drink anything else once you have started the bowel prep, unless your health care provider tells you it is safe to do so.  Arrange for someone to drive you home after the procedure. PROCEDURE   You will be given medicine to help you relax (sedative).  You will lie on your side with your knees bent.  A long, flexible tube with a light and camera on the end (colonoscope) will be inserted through the rectum and into  the colon. The camera sends video back to a computer screen as it moves through the colon. The colonoscope also releases carbon dioxide gas to inflate the colon. This helps your health care provider see the area better.  During the exam, your health care provider may take a small tissue sample (biopsy) to be examined under a microscope if any abnormalities are found.  The exam is finished when the entire colon has been viewed. AFTER THE PROCEDURE   Do not drive for 24 hours after the exam.  You may have a small amount of blood in your stool.  You may pass moderate amounts of  gas and have mild abdominal cramping or bloating. This is caused by the gas used to inflate your colon during the exam.  Ask when your test results will be ready and how you will get your results. Make sure you get your test results. Document Released: 05/19/2000 Document Revised: 03/12/2013 Document Reviewed: 01/27/2013 [facility] Patient Information 2015 [employer] [place]. This information is not intended to replace advice given to you by your health care provider. Make sure you discuss any questions you have with your health care provider. PATIENT INSTRUCTIONS POST-ANESTHESIA  IMMEDIATELY FOLLOWING SURGERY:  Do not drive or operate machinery for the first twenty four hours after surgery.  Do not make any important decisions for twenty four hours after surgery or while taking narcotic pain medications or sedatives.  If you develop intractable nausea and vomiting or a severe headache please notify your doctor immediately.  FOLLOW-UP:  Please make an appointment with your surgeon as instructed. You do not need to follow up with anesthesia unless specifically instructed to do so.  WOUND CARE INSTRUCTIONS (if applicable):  Keep a dry clean dressing on the anesthesia/puncture wound site if there is drainage.  Once the wound has quit draining you may leave it open to air.  Generally you should leave the bandage intact for twenty four hours unless there is drainage.  If the epidural site drains for more than 36-48 hours please call the anesthesia department.  QUESTIONS?:  Please feel free to call your physician or the hospital operator if you have any questions, and they will be happy to assist you.

## 2014-05-04 ENCOUNTER — Encounter (HOSPITAL_COMMUNITY)
Admission: RE | Admit: 2014-05-04 | Discharge: 2014-05-04 | Disposition: A | Discharge status: Home / Self Care | Payer: Medicare Other | Source: Ambulatory Visit | Attending: Internal Medicine | Admitting: Internal Medicine

## 2014-05-04 ENCOUNTER — Telehealth: Payer: Self-pay

## 2014-05-04 NOTE — Progress Notes (Signed)
Upon assessment of patient, it appears that she is not well educated on the procedure that she is supposed to have and is a very poor historian.  Could tell me her full name and date of birth, however did not recall if she had taken her medications this morning or seeing Tana Coast in the office in October, where this information was reviewed.  Verlon Au noted, in OVN that a HCPOA was to be assigned to her prior to her procedure, as she was not capable of signing her own consent and that this was to be addressed before procedure was to be scheduled.  This was not followed through with, therefore procedure will be postponed until it can be addressed.  Doris at [facility].

## 2014-05-04 NOTE — Telephone Encounter (Signed)
Needs office visit. Please let Dr. Letitia Neri office know. Will need to have someone consent for her.

## 2014-05-04 NOTE — Telephone Encounter (Signed)
[facility], pt is there for pre-op for colonoscopy Thursday. Tommy said the pt came in crying and did not know why she is there and she doesn't understand anything about a colonoscopy. ( She resides at a facility). She does not have a POA.  I reviewed Tana CoastLeslie Lewis' OV note and it looks like this was supposed to have been worked out before it was scheduled.  Routing to Gerrit HallsAnna Sams, NP in Leslie's absence today.  I will cancel the procedure at the hospital for 05/07/2014.

## 2014-05-05 NOTE — Telephone Encounter (Signed)
I called the facility and spoke to Robin. She said the pt has a sister and mom that works with the facility. She gave me the phone number of her sister [name], [phone]. I called but could not leave a message. I am mailing a letter to [name] at the facility that the pt needs a POA and be scheduled an ov to get rescheduled for the colonoscopy.   I am also faxing a letter to Dr. Felecia Shelling to let him know what is going on.

## 2014-05-06 ENCOUNTER — Encounter: Payer: Self-pay | Admitting: Internal Medicine

## 2014-05-06 NOTE — Telephone Encounter (Signed)
Tami Nolan, please schedule the patient an ov to set up her procedure.    Doris, Dr. Rae Lips pcp) stated she competent to consent for her procedure.

## 2014-05-06 NOTE — Telephone Encounter (Signed)
APPT MADE AND REST HOME AWARE OF DATE AND TIME, LETTER ALSO SENT

## 2014-05-06 NOTE — Telephone Encounter (Signed)
See 03/26/14 telephone encounter. This issue was brought up several times and Dr. Felecia ShellingFanta reported her to be competent. Family owns the home she resides in and they too voiced that she was competent and signed for herself.   Agree, she needs OV in order to reschedule; however, issues regarding consent needs to be addressed prior to that OV. Last time the patient came without any family.

## 2014-05-07 ENCOUNTER — Ambulatory Visit (HOSPITAL_COMMUNITY): Admission: RE | Admit: 2014-05-07 | Payer: Medicare Other | Source: Ambulatory Visit | Admitting: Internal Medicine

## 2014-05-07 ENCOUNTER — Encounter (HOSPITAL_COMMUNITY): Admission: RE | Payer: Self-pay | Source: Ambulatory Visit

## 2014-05-07 SURGERY — COLONOSCOPY WITH PROPOFOL
Anesthesia: Monitor Anesthesia Care

## 2014-05-13 NOTE — Telephone Encounter (Signed)
Noted. Agree with sending letter to her PCP, there referring provider.

## 2014-05-13 NOTE — Telephone Encounter (Signed)
Letter was mailed to the patient at the

## 2014-05-13 NOTE — Telephone Encounter (Signed)
Routing to Leslie Lewis, PA.  

## 2014-05-13 NOTE — Telephone Encounter (Signed)
I called the facility and spoke to Robin. She said that the pt's sister Annice PihJackie helps look after her. I tried to call Annice PihJackie and was unable to reach her, VM full. I called Zella BallRobin and informed her that since I could not get in touch with Annice PihJackie that I would be mailing another letter to Annice PihJackie at the address of the facility and to make sure that Annice PihJackie gets the letter. I had just typed the letter and ready to mail it and I got a phone call from [place] and she said that she has discussed the procedure with the patient and she does not want to have the colonoscopy. I told her I will let Tana CoastLeslie Lewis, PA know and cancel the appointment on 06/16/2014. ( [facility]) Sending letter to the patient that it has been canceled per Annice PihJackie. Will send Dr. Felecia ShellingFanta a letter also.

## 2014-06-16 ENCOUNTER — Ambulatory Visit: Payer: Medicare Other | Admitting: Gastroenterology

## 2014-08-23 ENCOUNTER — Emergency Department (HOSPITAL_COMMUNITY)
Admission: EM | Admit: 2014-08-23 | Discharge: 2014-08-23 | Disposition: A | Discharge status: Home / Self Care | Payer: Medicare Other | Attending: Emergency Medicine | Admitting: Emergency Medicine

## 2014-08-23 ENCOUNTER — Encounter (HOSPITAL_COMMUNITY): Payer: Self-pay | Admitting: Emergency Medicine

## 2014-08-23 DIAGNOSIS — G35 Multiple sclerosis: Secondary | ICD-10-CM | POA: Insufficient documentation

## 2014-08-23 DIAGNOSIS — Z720 Tobacco use: Secondary | ICD-10-CM | POA: Insufficient documentation

## 2014-08-23 DIAGNOSIS — R404 Transient alteration of awareness: Secondary | ICD-10-CM | POA: Diagnosis not present

## 2014-08-23 DIAGNOSIS — Z8719 Personal history of other diseases of the digestive system: Secondary | ICD-10-CM | POA: Diagnosis not present

## 2014-08-23 DIAGNOSIS — F419 Anxiety disorder, unspecified: Secondary | ICD-10-CM | POA: Insufficient documentation

## 2014-08-23 DIAGNOSIS — I10 Essential (primary) hypertension: Secondary | ICD-10-CM | POA: Insufficient documentation

## 2014-08-23 DIAGNOSIS — Z86010 Personal history of colonic polyps: Secondary | ICD-10-CM | POA: Diagnosis not present

## 2014-08-23 DIAGNOSIS — F17210 Nicotine dependence, cigarettes, uncomplicated: Secondary | ICD-10-CM | POA: Diagnosis not present

## 2014-08-23 DIAGNOSIS — R279 Unspecified lack of coordination: Secondary | ICD-10-CM | POA: Diagnosis not present

## 2014-08-23 DIAGNOSIS — E669 Obesity, unspecified: Secondary | ICD-10-CM | POA: Diagnosis not present

## 2014-08-23 DIAGNOSIS — M79604 Pain in right leg: Secondary | ICD-10-CM | POA: Diagnosis not present

## 2014-08-23 DIAGNOSIS — Z8673 Personal history of transient ischemic attack (TIA), and cerebral infarction without residual deficits: Secondary | ICD-10-CM | POA: Insufficient documentation

## 2014-08-23 DIAGNOSIS — Z7982 Long term (current) use of aspirin: Secondary | ICD-10-CM | POA: Diagnosis not present

## 2014-08-23 DIAGNOSIS — R350 Frequency of micturition: Secondary | ICD-10-CM | POA: Diagnosis not present

## 2014-08-23 DIAGNOSIS — Z8619 Personal history of other infectious and parasitic diseases: Secondary | ICD-10-CM | POA: Insufficient documentation

## 2014-08-23 DIAGNOSIS — Z79899 Other long term (current) drug therapy: Secondary | ICD-10-CM | POA: Insufficient documentation

## 2014-08-23 DIAGNOSIS — Z7401 Bed confinement status: Secondary | ICD-10-CM | POA: Diagnosis not present

## 2014-08-23 LAB — CBC WITH DIFFERENTIAL/PLATELET
Basophils Absolute: 0.1 10*3/uL (ref 0.0–0.1)
Basophils Relative: 1 % (ref 0–1)
Eosinophils Absolute: 0.2 10*3/uL (ref 0.0–0.7)
Eosinophils Relative: 3 % (ref 0–5)
HCT: 41.5 % (ref 36.0–46.0)
Hemoglobin: 14.5 g/dL (ref 12.0–15.0)
LYMPHS PCT: 31 % (ref 12–46)
Lymphs Abs: 2.1 10*3/uL (ref 0.7–4.0)
MCH: 27.6 pg (ref 26.0–34.0)
MCHC: 34.9 g/dL (ref 30.0–36.0)
MCV: 79 fL (ref 78.0–100.0)
MONO ABS: 0.5 10*3/uL (ref 0.1–1.0)
Monocytes Relative: 8 % (ref 3–12)
NEUTROS ABS: 3.9 10*3/uL (ref 1.7–7.7)
Neutrophils Relative %: 57 % (ref 43–77)
Platelets: 287 10*3/uL (ref 150–400)
RBC: 5.25 MIL/uL — AB (ref 3.87–5.11)
RDW: 14.4 % (ref 11.5–15.5)
WBC: 6.8 10*3/uL (ref 4.0–10.5)

## 2014-08-23 LAB — BASIC METABOLIC PANEL
ANION GAP: 8 (ref 5–15)
BUN: 13 mg/dL (ref 6–23)
CALCIUM: 9 mg/dL (ref 8.4–10.5)
CO2: 31 mmol/L (ref 19–32)
Chloride: 103 mmol/L (ref 96–112)
Creatinine, Ser: 0.69 mg/dL (ref 0.50–1.10)
GFR calc Af Amer: >90 mL/min (ref >90)
Glucose, Bld: 123 mg/dL — ABNORMAL HIGH (ref 70–99)
POTASSIUM: 3.8 mmol/L (ref 3.5–5.1)
SODIUM: 142 mmol/L (ref 135–145)

## 2014-08-23 LAB — URINE MICROSCOPIC-ADD ON

## 2014-08-23 LAB — URINALYSIS, ROUTINE W REFLEX MICROSCOPIC
Bilirubin Urine: NEGATIVE
GLUCOSE, UA: NEGATIVE mg/dL
Ketones, ur: NEGATIVE mg/dL
LEUKOCYTES UA: NEGATIVE
Nitrite: NEGATIVE
PH: 6 (ref 5.0–8.0)
PROTEIN: NEGATIVE mg/dL
Specific Gravity, Urine: 1.025 (ref 1.005–1.030)
Urobilinogen, UA: 0.2 mg/dL (ref 0.0–1.0)

## 2014-08-23 NOTE — Discharge Instructions (Signed)
Hypertension °Hypertension, commonly called high blood pressure, is when the force of blood pumping through your arteries is too strong. Your arteries are the blood vessels that carry blood from your heart throughout your body. A blood pressure reading consists of a higher number over a lower number, such as 110/72. The higher number (systolic) is the pressure inside your arteries when your heart pumps. The lower number (diastolic) is the pressure inside your arteries when your heart relaxes. Ideally you want your blood pressure below 120/80. °Hypertension forces your heart to work harder to pump blood. Your arteries may become narrow or stiff. Having hypertension puts you at risk for heart disease, stroke, and other problems.  °RISK FACTORS °Some risk factors for high blood pressure are controllable. Others are not.  °Risk factors you cannot control include:  °· Race. You may be at higher risk if you are African American. °· Age. Risk increases with age. °· Gender. Men are at higher risk than women before age 45 years. After age 65, women are at higher risk than men. °Risk factors you can control include: °· Not getting enough exercise or physical activity. °· Being overweight. °· Getting too much fat, sugar, calories, or salt in your diet. °· Drinking too much alcohol. °SIGNS AND SYMPTOMS °Hypertension does not usually cause signs or symptoms. Extremely high blood pressure (hypertensive crisis) may cause headache, anxiety, shortness of breath, and nosebleed. °DIAGNOSIS  °To check if you have hypertension, your health care provider will measure your blood pressure while you are seated, with your arm held at the level of your heart. It should be measured at least twice using the same arm. Certain conditions can cause a difference in blood pressure between your right and left arms. A blood pressure reading that is higher than normal on one occasion does not mean that you need treatment. If one blood pressure reading  is high, ask your health care provider about having it checked again. °TREATMENT  °Treating high blood pressure includes making lifestyle changes and possibly taking medicine. Living a healthy lifestyle can help lower high blood pressure. You may need to change some of your habits. °Lifestyle changes may include: °· Following the DASH diet. This diet is high in fruits, vegetables, and whole grains. It is low in salt, red meat, and added sugars. °· Getting at least 2½ hours of brisk physical activity every week. °· Losing weight if necessary. °· Not smoking. °· Limiting alcoholic beverages. °· Learning ways to reduce stress. ° If lifestyle changes are not enough to get your blood pressure under control, your health care provider may prescribe medicine. You may need to take more than one. Work closely with your health care provider to understand the risks and benefits. °HOME CARE INSTRUCTIONS °· Have your blood pressure rechecked as directed by your health care provider.   °· Take medicines only as directed by your health care provider. Follow the directions carefully. Blood pressure medicines must be taken as prescribed. The medicine does not work as well when you skip doses. Skipping doses also puts you at risk for problems.   °· Do not smoke.   °· Monitor your blood pressure at home as directed by your health care provider.  °SEEK MEDICAL CARE IF:  °· You think you are having a reaction to medicines taken. °· You have recurrent headaches or feel dizzy. °· You have swelling in your ankles. °· You have trouble with your vision. °SEEK IMMEDIATE MEDICAL CARE IF: °· You develop a severe headache or confusion. °·   You have unusual weakness, numbness, or feel faint.  You have severe chest or abdominal pain.  You vomit repeatedly.  You have trouble breathing. MAKE SURE YOU:   Understand these instructions.  Will watch your condition.  Will get help right away if you are not doing well or get worse. Document  Released: 05/22/2005 Document Revised: 10/06/2013 Document Reviewed: 03/14/2013 [facility] Patient Information 2015 [place], [place]. This information is not intended to replace advice given to you by your health care provider. Make sure you discuss any questions you have with your health care provider.   Your blood pressure is slightly elevated this morning, but there is no reflection in your blood work that it is causing complications at this time.  Your urine is negative for infection.  Follow up with your doctor this week if you have any further problems or concerns.

## 2014-08-23 NOTE — ED Provider Notes (Signed)
CSN: [id number]     Arrival date & time 08/23/14  9030 History   First MD Initiated Contact with Patient 08/23/14 [phone]     Chief Complaint  Patient presents with  . Urinary Frequency  . Hypertension    (Consider location/radiation/quality/duration/timing/severity/associated sxs/prior Treatment) The history is provided by the patient and the nursing home. The history is limited by the condition of the patient.   Tami Nolan is a 54 y.o. female with a history of stroke, hepatitis associated with history of alcohol abuse and MS presenting from her nursing facility for evaluation of elevated blood pressures and increased urinary frequency.  Patient is able to give very limited history given her baseline mental status.  At baseline she is bedbound, incontinent, and can give less than meaningful medical history.  She does however deny any physical symptoms at this time.  Specifically she denies chest pain, headache, abdominal pain, burning with urination.  However when asked simple questions like if she ate breakfast this morning her response is " I don't know."  Per home nursing notes recent systolic blood pressures since then in the 200 range.     Past Medical History  Diagnosis Date  . Hepatitis     ETOH related   . ETOH abuse   . Hx of abnormal cervical Pap smear 1997  . Colon polyps 1997  . Genital warts   . Multiple sclerosis   . Hypokalemia   . Rhabdomyolysis   . Anxiety   . Stroke   . History of electroencephalogram 08/2012    normal   Past Surgical History  Procedure Laterality Date  . Bilatetral tubal ligation     Family History  Problem Relation Age of Onset  . Coronary artery disease Brother   . Diabetes Brother   . Hypertension Brother   . Hypertension Mother   . Diabetes Mother   . Hypertension Sister   . Alcohol abuse      family history    History  Substance Use Topics  . Smoking status: Current Every Day Smoker -- 1.00 packs/day for 15 years    Types:  Cigarettes  . Smokeless tobacco: Current User  . Alcohol Use: No     Comment: Hx of ETOH abuse - dry for a couple of years.    OB History    No data available     Review of Systems  Unable to perform ROS     Allergies  Review of patient's allergies indicates no known allergies.  Home Medications   Prior to Admission medications   Medication Sig Start Date End Date Taking? Authorizing Provider  aspirin 325 MG EC tablet Take 325 mg by mouth daily.    Historical Provider, MD  cyclobenzaprine (FLEXERIL) 10 MG tablet Take 1 tablet (10 mg total) by mouth 2 (two) times daily. 08/02/12   Noemi Chapel, MD  dalfampridine (AMPYRA) 10 MG TB12 Take 10 mg by mouth 2 (two) times daily.    Historical Provider, MD  Dimethyl Fumarate (TECFIDERA) 240 MG CPDR Take by mouth 2 (two) times daily.    Historical Provider, MD  furosemide (LASIX) 20 MG tablet Take 20 mg by mouth daily.    Historical Provider, MD  HYDROcodone-acetaminophen (NORCO/VICODIN) 5-325 MG per tablet Take 1 tablet by mouth 2 (two) times daily. Pain 08/02/12   Noemi Chapel, MD  ketoconazole (NIZORAL) 2 % cream Apply 1 application topically daily. As directed.    Historical Provider, MD  levETIRAcetam (KEPPRA) 500 MG tablet  Take 2 tablets (1,000 mg total) by mouth 2 (two) times daily. 07/10/13   Rosita Fire, MD  Linaclotide (LINZESS) 145 MCG CAPS capsule Take 1 capsule (145 mcg total) by mouth daily. 03/26/14   Mahala Menghini, PA-C  lisinopril (PRINIVIL,ZESTRIL) 20 MG tablet Take 20 mg by mouth daily.    Historical Provider, MD  loratadine (CLARITIN) 10 MG tablet Take 1 tablet (10 mg total) by mouth daily. 08/02/12   Noemi Chapel, MD  metoprolol tartrate (LOPRESSOR) 25 MG tablet Take 50 mg by mouth 2 (two) times daily. 08/02/12   Noemi Chapel, MD  montelukast (SINGULAIR) 10 MG tablet Take 1 tablet (10 mg total) by mouth at bedtime. 08/02/12   Noemi Chapel, MD  Multiple Vitamin (MULTIVITAMIN WITH MINERALS) TABS Take 1 tablet by mouth daily.  08/02/12   Noemi Chapel, MD  naproxen (NAPROSYN) 500 MG tablet Take 500 mg by mouth 2 (two) times daily with a meal.    Historical Provider, MD  peg 3350 powder (MOVIPREP) 100 G SOLR Take 1 kit (200 g total) by mouth as directed. 04/15/14   Daneil Dolin, MD  sennosides-docusate sodium (SENOKOT-S) 8.6-50 MG tablet Take 1 tablet by mouth daily. As needed    Historical Provider, MD  simvastatin (ZOCOR) 40 MG tablet Take 1 tablet (40 mg total) by mouth at bedtime. 08/02/12   Noemi Chapel, MD  sodium phosphate (FLEET) 7-19 GM/118ML ENEM Place 133 mLs (1 enema total) rectally once. 04/15/14   Daneil Dolin, MD  thiamine 100 MG tablet Take 1 tablet (100 mg total) by mouth daily. 07/10/13   Rosita Fire, MD  traZODone (DESYREL) 100 MG tablet Take 200 mg by mouth at bedtime. 08/02/12   Noemi Chapel, MD  venlafaxine XR (EFFEXOR-XR) 150 MG 24 hr capsule Take 1 capsule (150 mg total) by mouth daily. 08/02/12   Noemi Chapel, MD  venlafaxine XR (EFFEXOR-XR) 75 MG 24 hr capsule Take 75 mg by mouth daily. Taken with 120m capsule    Historical Provider, MD   BP 139/95 mmHg  Pulse 86  Temp(Src) 98.2 F (36.8 C) (Oral)  Resp 18  Wt 201 lb (91.173 kg)  SpO2 93% Physical Exam  Constitutional: She appears well-developed and well-nourished.  Obese pleasant female in no acute distress  HENT:  Head: Normocephalic and atraumatic.  Mouth/Throat: Oropharynx is clear and moist.  Eyes: Conjunctivae and EOM are normal. Pupils are equal, round, and reactive to light.  Neck: Normal range of motion.  Cardiovascular: Normal rate, regular rhythm, normal heart sounds and intact distal pulses.   Pulmonary/Chest: Effort normal and breath sounds normal. She has no wheezes. She has no rales.  Abdominal: Soft. Bowel sounds are normal. She exhibits no mass. There is no tenderness. There is no guarding.  Musculoskeletal: Normal range of motion.  Neurological: She is alert.  Confused  Skin: Skin is warm and dry.  Psychiatric:  She has a normal mood and affect.  Nursing note and vitals reviewed.   ED Course  Procedures (including critical care time) Labs Review Labs Reviewed  URINALYSIS, ROUTINE W REFLEX MICROSCOPIC - Abnormal; Notable for the following:    APPearance HAZY (*)    Hgb urine dipstick TRACE (*)    All other components within normal limits  CBC WITH DIFFERENTIAL/PLATELET - Abnormal; Notable for the following:    RBC 5.25 (*)    All other components within normal limits  BASIC METABOLIC PANEL - Abnormal; Notable for the following:    Glucose, Bld 123 (*)  All other components within normal limits  URINE MICROSCOPIC-ADD ON    Imaging Review No results found.   EKG Interpretation None      MDM   Final diagnoses:  Essential hypertension    Mildly elevated blood pressure today with unremarkable laboratory tests.  Urinalysis is negative for infection.  Patient was given reassurance and advised to follow-up with her primary doctor this week if she has continued symptoms or concerns.  The patient appears reasonably screened and/or stabilized for discharge and I doubt any other medical condition or other [facility] requiring further screening, evaluation, or treatment in the ED at this time prior to discharge.     Evalee Jefferson, PA-C 08/23/14 [place], MD 08/23/14 [phone]

## 2014-08-23 NOTE — ED Notes (Signed)
Per [facility] SNF facility reports HTN with recent b/p  200's systolic. Per EMS systolic was 140's. Pt c/o urinary frequency recently.

## 2014-09-10 DIAGNOSIS — H25813 Combined forms of age-related cataract, bilateral: Secondary | ICD-10-CM | POA: Diagnosis not present

## 2014-09-10 DIAGNOSIS — H43393 Other vitreous opacities, bilateral: Secondary | ICD-10-CM | POA: Diagnosis not present

## 2014-10-01 DIAGNOSIS — R7309 Other abnormal glucose: Secondary | ICD-10-CM | POA: Diagnosis not present

## 2014-10-01 DIAGNOSIS — G35 Multiple sclerosis: Secondary | ICD-10-CM | POA: Diagnosis not present

## 2014-10-01 DIAGNOSIS — I10 Essential (primary) hypertension: Secondary | ICD-10-CM | POA: Diagnosis not present

## 2014-10-01 DIAGNOSIS — Z823 Family history of stroke: Secondary | ICD-10-CM | POA: Diagnosis not present

## 2014-10-01 DIAGNOSIS — F17200 Nicotine dependence, unspecified, uncomplicated: Secondary | ICD-10-CM | POA: Diagnosis not present

## 2014-10-01 DIAGNOSIS — E780 Pure hypercholesterolemia: Secondary | ICD-10-CM | POA: Diagnosis not present

## 2014-10-24 IMAGING — CT CT HEAD W/O CM
1 of 2 series · 15 of 30 positions shown, 19 images · non-contrast
Comparison: MRI brain 04/05/2012.  CT head 04/05/2012.

CLINICAL DATA: Altered mental status.

CT HEAD WITHOUT CONTRAST
TECHNIQUE: Contiguous axial images were obtained from the base of
the skull through the vertex without contrast.

[Series 3: headtrauma 2.4 h60s · axial · 0.43mm/px · z∈[+58,+216]mm · 15 of 72 slices shown, 19 images]
[im 4/72  brain]
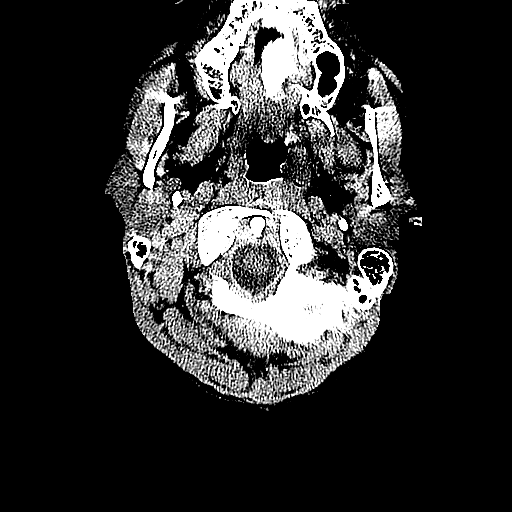
[im 4/72  bone]
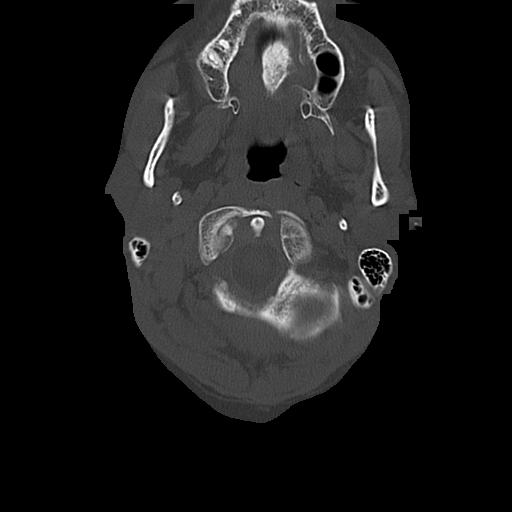
[im 8/72  brain]
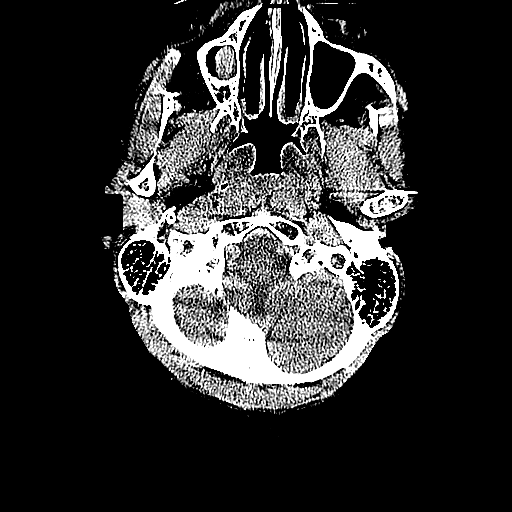
[im 15/72  brain]
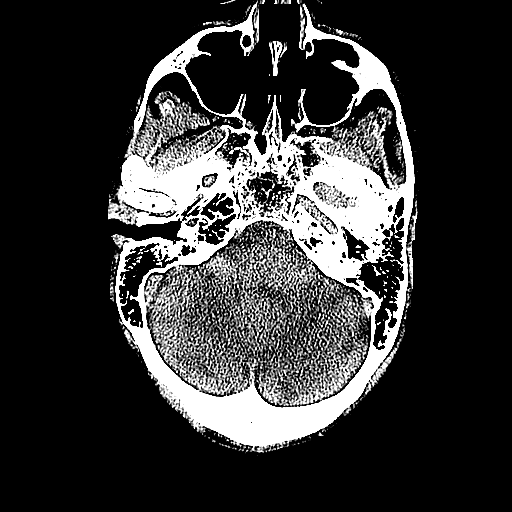
[im 19/72  brain]
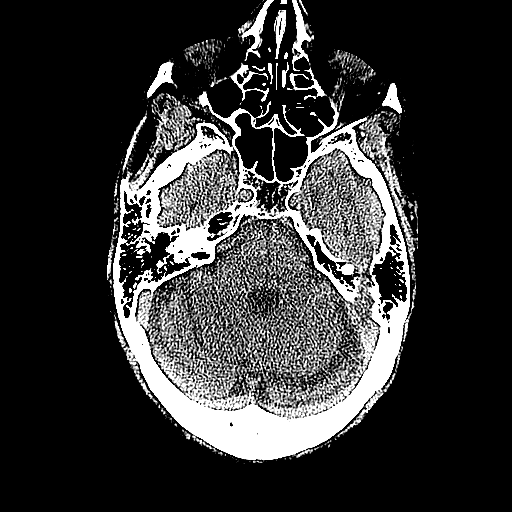
[im 23/72  brain]
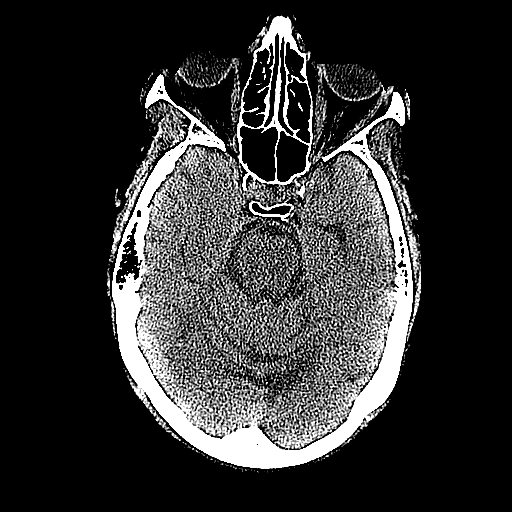
[im 23/72  bone]
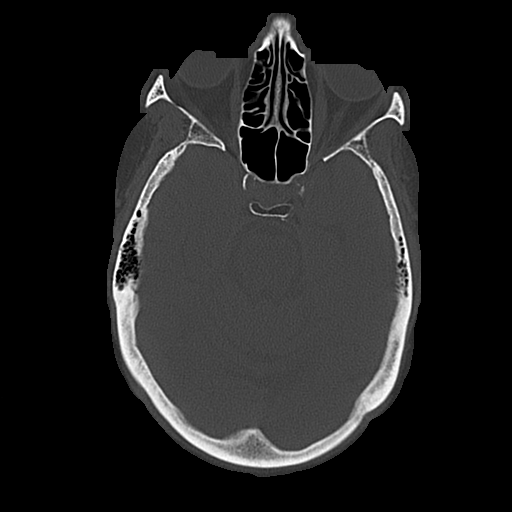
[im 27/72  brain]
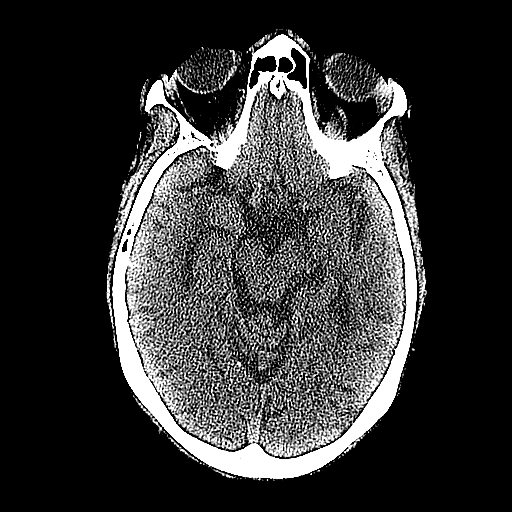
[im 30/72  brain]
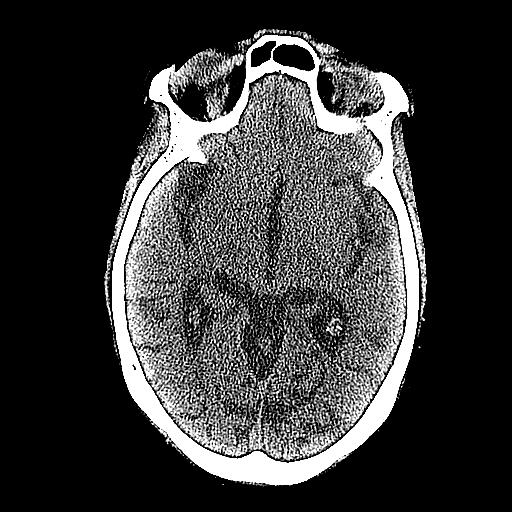
[im 38/72  brain]
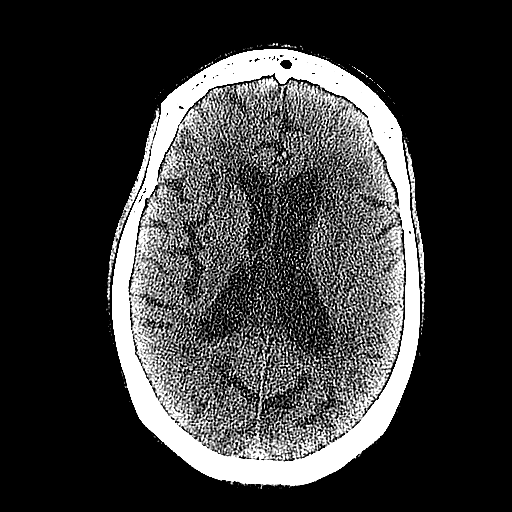
[im 42/72  brain]
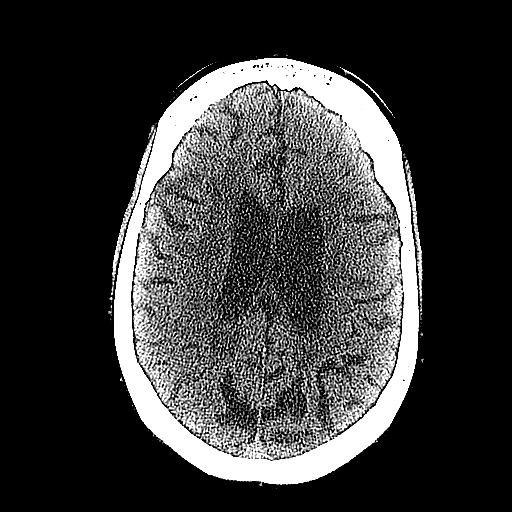
[im 42/72  bone]
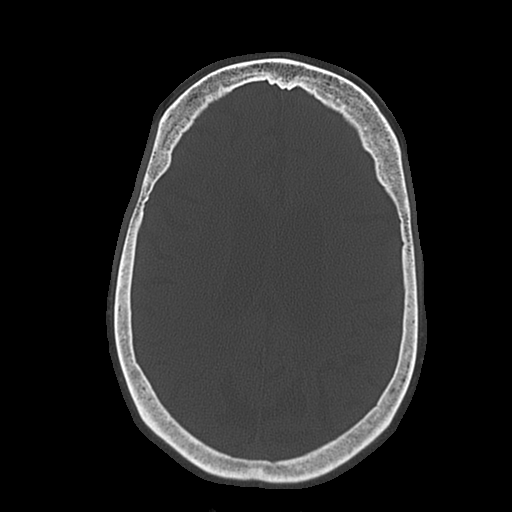
[im 45/72  brain]
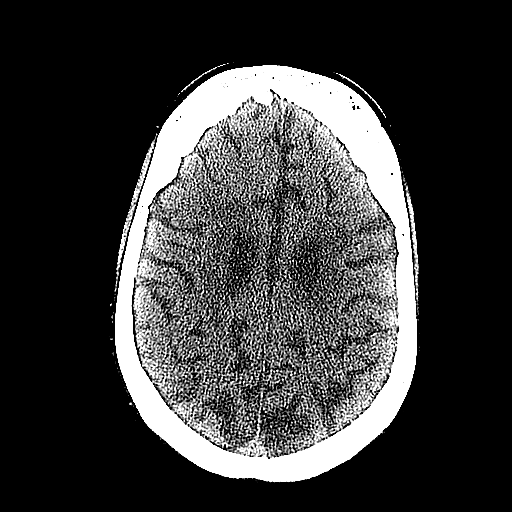
[im 49/72  brain]
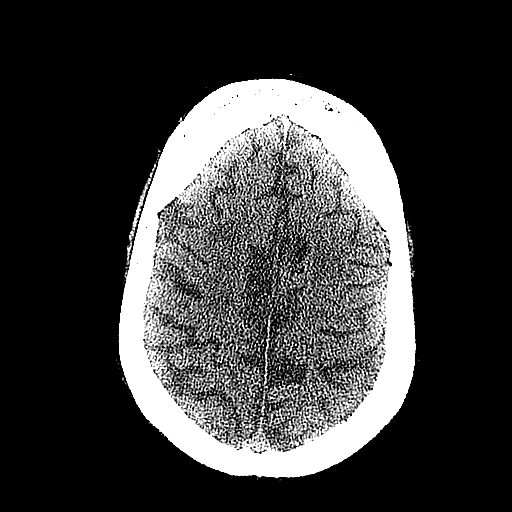
[im 53/72  brain]
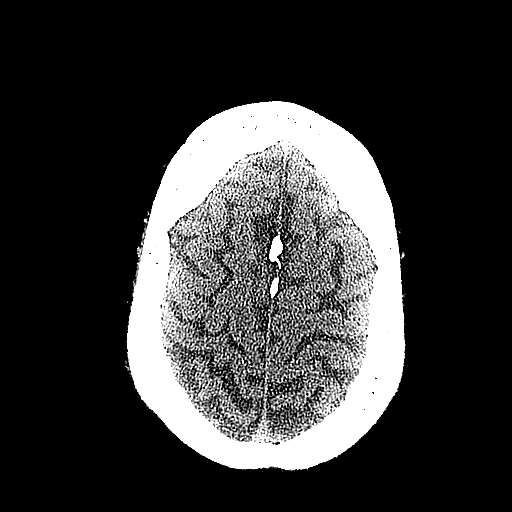
[im 60/72  brain]
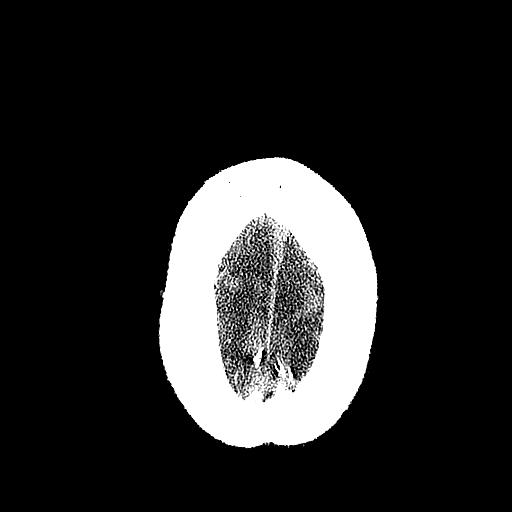
[im 60/72  bone]
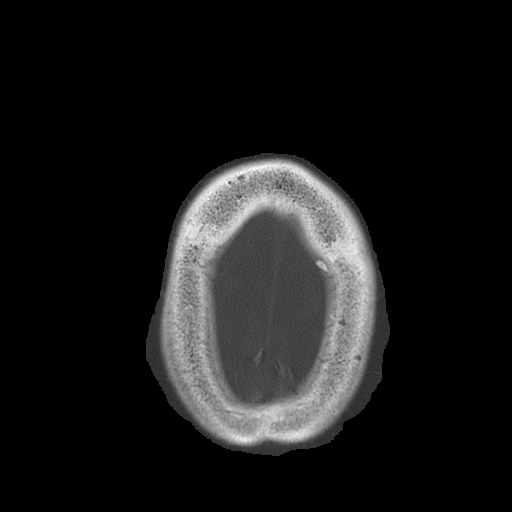
[im 64/72  brain]
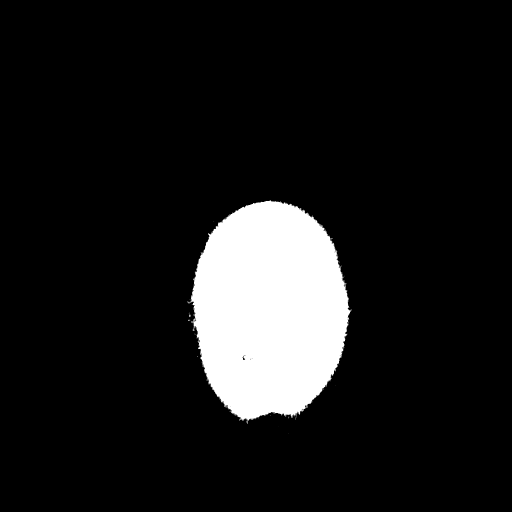
[im 68/72  brain]
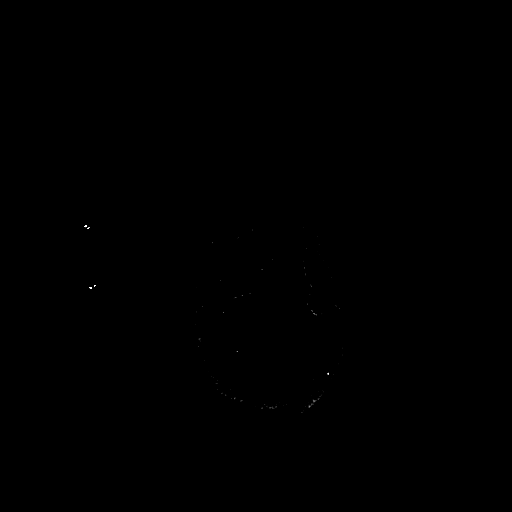

[15 of 30 positions shown; findings below may reference images not displayed]

FINDINGS: The posterior limb left internal capsule infarct is now
remote.  Extensive confluent periventricular and subcortical white
matter hypoattenuation bilaterally is otherwise stable.  No acute
cortical infarct, hemorrhage, or mass lesion is present.  The
ventricles are proportionate to the degree of atrophy.  No
significant extra-axial fluid collection is present.

A polyp or mucous retention cyst is present along the inferior
aspect of the right maxillary sinus.  The paranasal sinuses and
mastoid air cells are otherwise clear.  The osseous skull is
intact.
IMPRESSION: 1.  Remote lacunar infarct of the posterior limb left internal
capsule.
2.  No acute intracranial abnormality.
3.  Stable atrophy and diffuse white matter disease.

## 2014-11-18 DIAGNOSIS — M25562 Pain in left knee: Secondary | ICD-10-CM | POA: Diagnosis not present

## 2014-11-18 DIAGNOSIS — R270 Ataxia, unspecified: Secondary | ICD-10-CM | POA: Diagnosis not present

## 2014-11-18 DIAGNOSIS — R569 Unspecified convulsions: Secondary | ICD-10-CM | POA: Diagnosis not present

## 2014-11-18 DIAGNOSIS — G35 Multiple sclerosis: Secondary | ICD-10-CM | POA: Diagnosis not present

## 2014-12-01 DIAGNOSIS — I69341 Monoplegia of lower limb following cerebral infarction affecting right dominant side: Secondary | ICD-10-CM | POA: Diagnosis not present

## 2014-12-01 DIAGNOSIS — E785 Hyperlipidemia, unspecified: Secondary | ICD-10-CM | POA: Diagnosis not present

## 2014-12-01 DIAGNOSIS — I10 Essential (primary) hypertension: Secondary | ICD-10-CM | POA: Diagnosis not present

## 2014-12-01 DIAGNOSIS — G35 Multiple sclerosis: Secondary | ICD-10-CM | POA: Diagnosis not present

## 2014-12-03 DIAGNOSIS — I69341 Monoplegia of lower limb following cerebral infarction affecting right dominant side: Secondary | ICD-10-CM | POA: Diagnosis not present

## 2014-12-03 DIAGNOSIS — G35 Multiple sclerosis: Secondary | ICD-10-CM | POA: Diagnosis not present

## 2014-12-09 DIAGNOSIS — G35 Multiple sclerosis: Secondary | ICD-10-CM | POA: Diagnosis not present

## 2014-12-09 DIAGNOSIS — I69341 Monoplegia of lower limb following cerebral infarction affecting right dominant side: Secondary | ICD-10-CM | POA: Diagnosis not present

## 2014-12-11 DIAGNOSIS — G35 Multiple sclerosis: Secondary | ICD-10-CM | POA: Diagnosis not present

## 2014-12-11 DIAGNOSIS — I69341 Monoplegia of lower limb following cerebral infarction affecting right dominant side: Secondary | ICD-10-CM | POA: Diagnosis not present

## 2014-12-16 DIAGNOSIS — G35 Multiple sclerosis: Secondary | ICD-10-CM | POA: Diagnosis not present

## 2014-12-16 DIAGNOSIS — I69341 Monoplegia of lower limb following cerebral infarction affecting right dominant side: Secondary | ICD-10-CM | POA: Diagnosis not present

## 2014-12-18 DIAGNOSIS — I69341 Monoplegia of lower limb following cerebral infarction affecting right dominant side: Secondary | ICD-10-CM | POA: Diagnosis not present

## 2014-12-18 DIAGNOSIS — G35 Multiple sclerosis: Secondary | ICD-10-CM | POA: Diagnosis not present

## 2014-12-24 DIAGNOSIS — G35 Multiple sclerosis: Secondary | ICD-10-CM | POA: Diagnosis not present

## 2014-12-31 DIAGNOSIS — I6350 Cerebral infarction due to unspecified occlusion or stenosis of unspecified cerebral artery: Secondary | ICD-10-CM | POA: Diagnosis not present

## 2014-12-31 DIAGNOSIS — F17200 Nicotine dependence, unspecified, uncomplicated: Secondary | ICD-10-CM | POA: Diagnosis not present

## 2014-12-31 DIAGNOSIS — E785 Hyperlipidemia, unspecified: Secondary | ICD-10-CM | POA: Diagnosis not present

## 2014-12-31 DIAGNOSIS — I10 Essential (primary) hypertension: Secondary | ICD-10-CM | POA: Diagnosis not present

## 2014-12-31 DIAGNOSIS — R569 Unspecified convulsions: Secondary | ICD-10-CM | POA: Diagnosis not present

## 2014-12-31 DIAGNOSIS — G35 Multiple sclerosis: Secondary | ICD-10-CM | POA: Diagnosis not present

## 2014-12-31 DIAGNOSIS — I69341 Monoplegia of lower limb following cerebral infarction affecting right dominant side: Secondary | ICD-10-CM | POA: Diagnosis not present

## 2014-12-31 DIAGNOSIS — G40909 Epilepsy, unspecified, not intractable, without status epilepticus: Secondary | ICD-10-CM | POA: Diagnosis not present

## 2014-12-31 DIAGNOSIS — E1165 Type 2 diabetes mellitus with hyperglycemia: Secondary | ICD-10-CM | POA: Diagnosis not present

## 2015-02-05 DIAGNOSIS — G35 Multiple sclerosis: Secondary | ICD-10-CM | POA: Diagnosis not present

## 2015-03-02 ENCOUNTER — Other Ambulatory Visit (HOSPITAL_COMMUNITY): Payer: Self-pay | Admitting: Internal Medicine

## 2015-03-02 DIAGNOSIS — Z23 Encounter for immunization: Secondary | ICD-10-CM | POA: Diagnosis not present

## 2015-03-02 DIAGNOSIS — G35 Multiple sclerosis: Secondary | ICD-10-CM | POA: Diagnosis not present

## 2015-03-02 DIAGNOSIS — F17200 Nicotine dependence, unspecified, uncomplicated: Secondary | ICD-10-CM | POA: Diagnosis not present

## 2015-03-02 DIAGNOSIS — Z1231 Encounter for screening mammogram for malignant neoplasm of breast: Secondary | ICD-10-CM

## 2015-03-02 DIAGNOSIS — E1165 Type 2 diabetes mellitus with hyperglycemia: Secondary | ICD-10-CM | POA: Diagnosis not present

## 2015-03-02 DIAGNOSIS — G40909 Epilepsy, unspecified, not intractable, without status epilepticus: Secondary | ICD-10-CM | POA: Diagnosis not present

## 2015-03-02 DIAGNOSIS — I69949 Monoplegia of lower limb following unspecified cerebrovascular disease affecting unspecified side: Secondary | ICD-10-CM | POA: Diagnosis not present

## 2015-03-10 ENCOUNTER — Ambulatory Visit (HOSPITAL_COMMUNITY): Payer: Medicare Other

## 2015-03-24 DIAGNOSIS — H25813 Combined forms of age-related cataract, bilateral: Secondary | ICD-10-CM | POA: Diagnosis not present

## 2015-03-27 DIAGNOSIS — I638 Other cerebral infarction: Secondary | ICD-10-CM | POA: Diagnosis not present

## 2015-03-27 DIAGNOSIS — G4089 Other seizures: Secondary | ICD-10-CM | POA: Diagnosis not present

## 2015-03-27 DIAGNOSIS — G9349 Other encephalopathy: Secondary | ICD-10-CM | POA: Diagnosis not present

## 2015-03-27 DIAGNOSIS — G35 Multiple sclerosis: Secondary | ICD-10-CM | POA: Diagnosis not present

## 2015-03-27 DIAGNOSIS — M6282 Rhabdomyolysis: Secondary | ICD-10-CM | POA: Diagnosis not present

## 2015-04-01 DIAGNOSIS — F419 Anxiety disorder, unspecified: Secondary | ICD-10-CM | POA: Diagnosis not present

## 2015-04-01 DIAGNOSIS — F29 Unspecified psychosis not due to a substance or known physiological condition: Secondary | ICD-10-CM | POA: Diagnosis not present

## 2015-04-01 DIAGNOSIS — F329 Major depressive disorder, single episode, unspecified: Secondary | ICD-10-CM | POA: Diagnosis not present

## 2015-04-21 DIAGNOSIS — N390 Urinary tract infection, site not specified: Secondary | ICD-10-CM | POA: Diagnosis not present

## 2015-05-04 DIAGNOSIS — G4089 Other seizures: Secondary | ICD-10-CM | POA: Diagnosis not present

## 2015-05-04 DIAGNOSIS — G35 Multiple sclerosis: Secondary | ICD-10-CM | POA: Diagnosis not present

## 2015-05-04 DIAGNOSIS — I638 Other cerebral infarction: Secondary | ICD-10-CM | POA: Diagnosis not present

## 2015-05-04 DIAGNOSIS — M6282 Rhabdomyolysis: Secondary | ICD-10-CM | POA: Diagnosis not present

## 2015-05-04 DIAGNOSIS — G9349 Other encephalopathy: Secondary | ICD-10-CM | POA: Diagnosis not present

## 2015-05-17 DIAGNOSIS — F419 Anxiety disorder, unspecified: Secondary | ICD-10-CM | POA: Diagnosis not present

## 2015-05-17 DIAGNOSIS — F329 Major depressive disorder, single episode, unspecified: Secondary | ICD-10-CM | POA: Diagnosis not present

## 2015-05-17 DIAGNOSIS — F29 Unspecified psychosis not due to a substance or known physiological condition: Secondary | ICD-10-CM | POA: Diagnosis not present

## 2015-05-21 DIAGNOSIS — F419 Anxiety disorder, unspecified: Secondary | ICD-10-CM | POA: Diagnosis not present

## 2015-05-21 DIAGNOSIS — F319 Bipolar disorder, unspecified: Secondary | ICD-10-CM | POA: Diagnosis not present

## 2015-05-21 DIAGNOSIS — F29 Unspecified psychosis not due to a substance or known physiological condition: Secondary | ICD-10-CM | POA: Diagnosis not present

## 2015-05-21 DIAGNOSIS — F329 Major depressive disorder, single episode, unspecified: Secondary | ICD-10-CM | POA: Diagnosis not present

## 2015-06-03 DIAGNOSIS — F419 Anxiety disorder, unspecified: Secondary | ICD-10-CM | POA: Diagnosis not present

## 2015-06-03 DIAGNOSIS — F329 Major depressive disorder, single episode, unspecified: Secondary | ICD-10-CM | POA: Diagnosis not present

## 2015-06-03 DIAGNOSIS — F319 Bipolar disorder, unspecified: Secondary | ICD-10-CM | POA: Diagnosis not present

## 2015-06-10 DIAGNOSIS — E1151 Type 2 diabetes mellitus with diabetic peripheral angiopathy without gangrene: Secondary | ICD-10-CM | POA: Diagnosis not present

## 2015-06-17 DIAGNOSIS — F329 Major depressive disorder, single episode, unspecified: Secondary | ICD-10-CM | POA: Diagnosis not present

## 2015-06-17 DIAGNOSIS — F419 Anxiety disorder, unspecified: Secondary | ICD-10-CM | POA: Diagnosis not present

## 2015-06-17 DIAGNOSIS — F29 Unspecified psychosis not due to a substance or known physiological condition: Secondary | ICD-10-CM | POA: Diagnosis not present

## 2015-06-17 DIAGNOSIS — F319 Bipolar disorder, unspecified: Secondary | ICD-10-CM | POA: Diagnosis not present

## 2015-06-18 DIAGNOSIS — E785 Hyperlipidemia, unspecified: Secondary | ICD-10-CM | POA: Diagnosis not present

## 2015-06-18 DIAGNOSIS — Z5181 Encounter for therapeutic drug level monitoring: Secondary | ICD-10-CM | POA: Diagnosis not present

## 2015-06-18 DIAGNOSIS — G35 Multiple sclerosis: Secondary | ICD-10-CM | POA: Diagnosis not present

## 2015-06-18 DIAGNOSIS — G4089 Other seizures: Secondary | ICD-10-CM | POA: Diagnosis not present

## 2015-06-18 DIAGNOSIS — G9349 Other encephalopathy: Secondary | ICD-10-CM | POA: Diagnosis not present

## 2015-06-18 DIAGNOSIS — M6282 Rhabdomyolysis: Secondary | ICD-10-CM | POA: Diagnosis not present

## 2015-06-18 DIAGNOSIS — I10 Essential (primary) hypertension: Secondary | ICD-10-CM | POA: Diagnosis not present

## 2015-06-18 DIAGNOSIS — R740 Nonspecific elevation of levels of transaminase and lactic acid dehydrogenase [LDH]: Secondary | ICD-10-CM | POA: Diagnosis not present

## 2015-06-18 DIAGNOSIS — E119 Type 2 diabetes mellitus without complications: Secondary | ICD-10-CM | POA: Diagnosis not present

## 2015-06-18 DIAGNOSIS — I638 Other cerebral infarction: Secondary | ICD-10-CM | POA: Diagnosis not present

## 2015-06-20 DIAGNOSIS — R410 Disorientation, unspecified: Secondary | ICD-10-CM | POA: Diagnosis not present

## 2015-06-20 DIAGNOSIS — N390 Urinary tract infection, site not specified: Secondary | ICD-10-CM | POA: Diagnosis not present

## 2015-06-27 DIAGNOSIS — F331 Major depressive disorder, recurrent, moderate: Secondary | ICD-10-CM | POA: Diagnosis not present

## 2015-07-06 DIAGNOSIS — F29 Unspecified psychosis not due to a substance or known physiological condition: Secondary | ICD-10-CM | POA: Diagnosis not present

## 2015-07-06 DIAGNOSIS — F329 Major depressive disorder, single episode, unspecified: Secondary | ICD-10-CM | POA: Diagnosis not present

## 2015-07-06 DIAGNOSIS — F319 Bipolar disorder, unspecified: Secondary | ICD-10-CM | POA: Diagnosis not present

## 2015-07-06 DIAGNOSIS — F419 Anxiety disorder, unspecified: Secondary | ICD-10-CM | POA: Diagnosis not present

## 2015-07-11 DIAGNOSIS — F331 Major depressive disorder, recurrent, moderate: Secondary | ICD-10-CM | POA: Diagnosis not present

## 2015-07-13 DIAGNOSIS — G3184 Mild cognitive impairment, so stated: Secondary | ICD-10-CM | POA: Diagnosis not present

## 2015-07-13 DIAGNOSIS — R569 Unspecified convulsions: Secondary | ICD-10-CM | POA: Diagnosis not present

## 2015-07-13 DIAGNOSIS — G35 Multiple sclerosis: Secondary | ICD-10-CM | POA: Diagnosis not present

## 2015-07-13 DIAGNOSIS — F5109 Other insomnia not due to a substance or known physiological condition: Secondary | ICD-10-CM | POA: Diagnosis not present

## 2015-07-13 DIAGNOSIS — G4459 Other complicated headache syndrome: Secondary | ICD-10-CM | POA: Diagnosis not present

## 2015-07-13 DIAGNOSIS — Z79899 Other long term (current) drug therapy: Secondary | ICD-10-CM | POA: Diagnosis not present

## 2015-07-13 DIAGNOSIS — F329 Major depressive disorder, single episode, unspecified: Secondary | ICD-10-CM | POA: Diagnosis not present

## 2015-07-13 DIAGNOSIS — I6990 Unspecified sequelae of unspecified cerebrovascular disease: Secondary | ICD-10-CM | POA: Diagnosis not present

## 2015-07-13 DIAGNOSIS — F0391 Unspecified dementia with behavioral disturbance: Secondary | ICD-10-CM | POA: Diagnosis not present

## 2015-07-13 DIAGNOSIS — R260 Ataxic gait: Secondary | ICD-10-CM | POA: Diagnosis not present

## 2015-07-15 DIAGNOSIS — F331 Major depressive disorder, recurrent, moderate: Secondary | ICD-10-CM | POA: Diagnosis not present

## 2015-07-23 DIAGNOSIS — F331 Major depressive disorder, recurrent, moderate: Secondary | ICD-10-CM | POA: Diagnosis not present

## 2015-07-25 DIAGNOSIS — F331 Major depressive disorder, recurrent, moderate: Secondary | ICD-10-CM | POA: Diagnosis not present

## 2015-07-29 DIAGNOSIS — F331 Major depressive disorder, recurrent, moderate: Secondary | ICD-10-CM | POA: Diagnosis not present

## 2015-07-30 DIAGNOSIS — E119 Type 2 diabetes mellitus without complications: Secondary | ICD-10-CM | POA: Diagnosis not present

## 2015-08-03 DIAGNOSIS — F331 Major depressive disorder, recurrent, moderate: Secondary | ICD-10-CM | POA: Diagnosis not present

## 2015-08-03 DIAGNOSIS — G35 Multiple sclerosis: Secondary | ICD-10-CM | POA: Diagnosis not present

## 2015-08-03 DIAGNOSIS — G9349 Other encephalopathy: Secondary | ICD-10-CM | POA: Diagnosis not present

## 2015-08-03 DIAGNOSIS — I638 Other cerebral infarction: Secondary | ICD-10-CM | POA: Diagnosis not present

## 2015-08-05 DIAGNOSIS — F331 Major depressive disorder, recurrent, moderate: Secondary | ICD-10-CM | POA: Diagnosis not present

## 2015-08-05 DIAGNOSIS — F319 Bipolar disorder, unspecified: Secondary | ICD-10-CM | POA: Diagnosis not present

## 2015-08-05 DIAGNOSIS — F419 Anxiety disorder, unspecified: Secondary | ICD-10-CM | POA: Diagnosis not present

## 2015-08-05 DIAGNOSIS — F29 Unspecified psychosis not due to a substance or known physiological condition: Secondary | ICD-10-CM | POA: Diagnosis not present

## 2015-08-05 DIAGNOSIS — F329 Major depressive disorder, single episode, unspecified: Secondary | ICD-10-CM | POA: Diagnosis not present

## 2015-08-08 DIAGNOSIS — F331 Major depressive disorder, recurrent, moderate: Secondary | ICD-10-CM | POA: Diagnosis not present

## 2015-08-10 DIAGNOSIS — F331 Major depressive disorder, recurrent, moderate: Secondary | ICD-10-CM | POA: Diagnosis not present

## 2015-08-17 DIAGNOSIS — F331 Major depressive disorder, recurrent, moderate: Secondary | ICD-10-CM | POA: Diagnosis not present

## 2015-08-19 DIAGNOSIS — E1151 Type 2 diabetes mellitus with diabetic peripheral angiopathy without gangrene: Secondary | ICD-10-CM | POA: Diagnosis not present

## 2015-08-20 DIAGNOSIS — F331 Major depressive disorder, recurrent, moderate: Secondary | ICD-10-CM | POA: Diagnosis not present

## 2015-08-22 DIAGNOSIS — F331 Major depressive disorder, recurrent, moderate: Secondary | ICD-10-CM | POA: Diagnosis not present

## 2015-08-25 DIAGNOSIS — F331 Major depressive disorder, recurrent, moderate: Secondary | ICD-10-CM | POA: Diagnosis not present

## 2015-08-29 DIAGNOSIS — F331 Major depressive disorder, recurrent, moderate: Secondary | ICD-10-CM | POA: Diagnosis not present

## 2015-09-01 DIAGNOSIS — F331 Major depressive disorder, recurrent, moderate: Secondary | ICD-10-CM | POA: Diagnosis not present

## 2015-09-06 DIAGNOSIS — Z7984 Long term (current) use of oral hypoglycemic drugs: Secondary | ICD-10-CM | POA: Diagnosis not present

## 2015-09-06 DIAGNOSIS — E113293 Type 2 diabetes mellitus with mild nonproliferative diabetic retinopathy without macular edema, bilateral: Secondary | ICD-10-CM | POA: Diagnosis not present

## 2015-09-06 DIAGNOSIS — H2513 Age-related nuclear cataract, bilateral: Secondary | ICD-10-CM | POA: Diagnosis not present

## 2015-09-06 DIAGNOSIS — H40023 Open angle with borderline findings, high risk, bilateral: Secondary | ICD-10-CM | POA: Diagnosis not present

## 2015-09-07 DIAGNOSIS — F419 Anxiety disorder, unspecified: Secondary | ICD-10-CM | POA: Diagnosis not present

## 2015-09-07 DIAGNOSIS — F29 Unspecified psychosis not due to a substance or known physiological condition: Secondary | ICD-10-CM | POA: Diagnosis not present

## 2015-09-07 DIAGNOSIS — F319 Bipolar disorder, unspecified: Secondary | ICD-10-CM | POA: Diagnosis not present

## 2015-09-07 DIAGNOSIS — F329 Major depressive disorder, single episode, unspecified: Secondary | ICD-10-CM | POA: Diagnosis not present

## 2015-09-09 DIAGNOSIS — F331 Major depressive disorder, recurrent, moderate: Secondary | ICD-10-CM | POA: Diagnosis not present

## 2015-09-11 DIAGNOSIS — F331 Major depressive disorder, recurrent, moderate: Secondary | ICD-10-CM | POA: Diagnosis not present

## 2015-09-14 DIAGNOSIS — F331 Major depressive disorder, recurrent, moderate: Secondary | ICD-10-CM | POA: Diagnosis not present

## 2015-09-16 DIAGNOSIS — I638 Other cerebral infarction: Secondary | ICD-10-CM | POA: Diagnosis not present

## 2015-09-16 DIAGNOSIS — G35 Multiple sclerosis: Secondary | ICD-10-CM | POA: Diagnosis not present

## 2015-09-16 DIAGNOSIS — G9349 Other encephalopathy: Secondary | ICD-10-CM | POA: Diagnosis not present

## 2015-09-16 DIAGNOSIS — F331 Major depressive disorder, recurrent, moderate: Secondary | ICD-10-CM | POA: Diagnosis not present

## 2015-09-21 DIAGNOSIS — F331 Major depressive disorder, recurrent, moderate: Secondary | ICD-10-CM | POA: Diagnosis not present

## 2015-09-25 DIAGNOSIS — F331 Major depressive disorder, recurrent, moderate: Secondary | ICD-10-CM | POA: Diagnosis not present

## 2015-09-28 DIAGNOSIS — F331 Major depressive disorder, recurrent, moderate: Secondary | ICD-10-CM | POA: Diagnosis not present

## 2015-09-29 DIAGNOSIS — F29 Unspecified psychosis not due to a substance or known physiological condition: Secondary | ICD-10-CM | POA: Diagnosis not present

## 2015-09-29 DIAGNOSIS — F419 Anxiety disorder, unspecified: Secondary | ICD-10-CM | POA: Diagnosis not present

## 2015-09-29 DIAGNOSIS — F329 Major depressive disorder, single episode, unspecified: Secondary | ICD-10-CM | POA: Diagnosis not present

## 2015-09-29 DIAGNOSIS — F319 Bipolar disorder, unspecified: Secondary | ICD-10-CM | POA: Diagnosis not present

## 2015-09-30 DIAGNOSIS — F331 Major depressive disorder, recurrent, moderate: Secondary | ICD-10-CM | POA: Diagnosis not present

## 2015-10-11 DIAGNOSIS — F331 Major depressive disorder, recurrent, moderate: Secondary | ICD-10-CM | POA: Diagnosis not present

## 2015-10-13 DIAGNOSIS — F331 Major depressive disorder, recurrent, moderate: Secondary | ICD-10-CM | POA: Diagnosis not present

## 2015-10-17 DIAGNOSIS — F331 Major depressive disorder, recurrent, moderate: Secondary | ICD-10-CM | POA: Diagnosis not present

## 2015-10-21 DIAGNOSIS — F419 Anxiety disorder, unspecified: Secondary | ICD-10-CM | POA: Diagnosis not present

## 2015-10-21 DIAGNOSIS — F331 Major depressive disorder, recurrent, moderate: Secondary | ICD-10-CM | POA: Diagnosis not present

## 2015-10-21 DIAGNOSIS — F319 Bipolar disorder, unspecified: Secondary | ICD-10-CM | POA: Diagnosis not present

## 2015-10-21 DIAGNOSIS — F29 Unspecified psychosis not due to a substance or known physiological condition: Secondary | ICD-10-CM | POA: Diagnosis not present

## 2015-10-21 DIAGNOSIS — F329 Major depressive disorder, single episode, unspecified: Secondary | ICD-10-CM | POA: Diagnosis not present

## 2015-10-22 DIAGNOSIS — E119 Type 2 diabetes mellitus without complications: Secondary | ICD-10-CM | POA: Diagnosis not present

## 2015-10-23 DIAGNOSIS — N390 Urinary tract infection, site not specified: Secondary | ICD-10-CM | POA: Diagnosis not present

## 2015-10-24 DIAGNOSIS — F331 Major depressive disorder, recurrent, moderate: Secondary | ICD-10-CM | POA: Diagnosis not present

## 2015-10-25 DIAGNOSIS — R279 Unspecified lack of coordination: Secondary | ICD-10-CM | POA: Diagnosis not present

## 2015-10-25 DIAGNOSIS — G40909 Epilepsy, unspecified, not intractable, without status epilepticus: Secondary | ICD-10-CM | POA: Diagnosis not present

## 2015-10-25 DIAGNOSIS — G35 Multiple sclerosis: Secondary | ICD-10-CM | POA: Diagnosis not present

## 2015-10-25 DIAGNOSIS — M6281 Muscle weakness (generalized): Secondary | ICD-10-CM | POA: Diagnosis not present

## 2015-10-25 DIAGNOSIS — F411 Generalized anxiety disorder: Secondary | ICD-10-CM | POA: Diagnosis not present

## 2015-10-25 DIAGNOSIS — M6282 Rhabdomyolysis: Secondary | ICD-10-CM | POA: Diagnosis not present

## 2015-10-26 DIAGNOSIS — M6281 Muscle weakness (generalized): Secondary | ICD-10-CM | POA: Diagnosis not present

## 2015-10-26 DIAGNOSIS — F411 Generalized anxiety disorder: Secondary | ICD-10-CM | POA: Diagnosis not present

## 2015-10-26 DIAGNOSIS — G40909 Epilepsy, unspecified, not intractable, without status epilepticus: Secondary | ICD-10-CM | POA: Diagnosis not present

## 2015-10-26 DIAGNOSIS — M6282 Rhabdomyolysis: Secondary | ICD-10-CM | POA: Diagnosis not present

## 2015-10-26 DIAGNOSIS — R279 Unspecified lack of coordination: Secondary | ICD-10-CM | POA: Diagnosis not present

## 2015-10-26 DIAGNOSIS — G35 Multiple sclerosis: Secondary | ICD-10-CM | POA: Diagnosis not present

## 2015-10-27 DIAGNOSIS — R279 Unspecified lack of coordination: Secondary | ICD-10-CM | POA: Diagnosis not present

## 2015-10-27 DIAGNOSIS — G40909 Epilepsy, unspecified, not intractable, without status epilepticus: Secondary | ICD-10-CM | POA: Diagnosis not present

## 2015-10-27 DIAGNOSIS — F411 Generalized anxiety disorder: Secondary | ICD-10-CM | POA: Diagnosis not present

## 2015-10-27 DIAGNOSIS — G35 Multiple sclerosis: Secondary | ICD-10-CM | POA: Diagnosis not present

## 2015-10-27 DIAGNOSIS — M6281 Muscle weakness (generalized): Secondary | ICD-10-CM | POA: Diagnosis not present

## 2015-10-27 DIAGNOSIS — F331 Major depressive disorder, recurrent, moderate: Secondary | ICD-10-CM | POA: Diagnosis not present

## 2015-10-27 DIAGNOSIS — M6282 Rhabdomyolysis: Secondary | ICD-10-CM | POA: Diagnosis not present

## 2015-10-28 DIAGNOSIS — G35 Multiple sclerosis: Secondary | ICD-10-CM | POA: Diagnosis not present

## 2015-10-28 DIAGNOSIS — G40909 Epilepsy, unspecified, not intractable, without status epilepticus: Secondary | ICD-10-CM | POA: Diagnosis not present

## 2015-10-28 DIAGNOSIS — M6282 Rhabdomyolysis: Secondary | ICD-10-CM | POA: Diagnosis not present

## 2015-10-28 DIAGNOSIS — F411 Generalized anxiety disorder: Secondary | ICD-10-CM | POA: Diagnosis not present

## 2015-10-28 DIAGNOSIS — M6281 Muscle weakness (generalized): Secondary | ICD-10-CM | POA: Diagnosis not present

## 2015-10-28 DIAGNOSIS — R279 Unspecified lack of coordination: Secondary | ICD-10-CM | POA: Diagnosis not present

## 2015-10-29 DIAGNOSIS — R279 Unspecified lack of coordination: Secondary | ICD-10-CM | POA: Diagnosis not present

## 2015-10-29 DIAGNOSIS — M6282 Rhabdomyolysis: Secondary | ICD-10-CM | POA: Diagnosis not present

## 2015-10-29 DIAGNOSIS — F411 Generalized anxiety disorder: Secondary | ICD-10-CM | POA: Diagnosis not present

## 2015-10-29 DIAGNOSIS — G40909 Epilepsy, unspecified, not intractable, without status epilepticus: Secondary | ICD-10-CM | POA: Diagnosis not present

## 2015-10-29 DIAGNOSIS — G35 Multiple sclerosis: Secondary | ICD-10-CM | POA: Diagnosis not present

## 2015-10-29 DIAGNOSIS — M6281 Muscle weakness (generalized): Secondary | ICD-10-CM | POA: Diagnosis not present

## 2015-11-01 DIAGNOSIS — M6282 Rhabdomyolysis: Secondary | ICD-10-CM | POA: Diagnosis not present

## 2015-11-01 DIAGNOSIS — R279 Unspecified lack of coordination: Secondary | ICD-10-CM | POA: Diagnosis not present

## 2015-11-01 DIAGNOSIS — G35 Multiple sclerosis: Secondary | ICD-10-CM | POA: Diagnosis not present

## 2015-11-01 DIAGNOSIS — F411 Generalized anxiety disorder: Secondary | ICD-10-CM | POA: Diagnosis not present

## 2015-11-01 DIAGNOSIS — G40909 Epilepsy, unspecified, not intractable, without status epilepticus: Secondary | ICD-10-CM | POA: Diagnosis not present

## 2015-11-01 DIAGNOSIS — M6281 Muscle weakness (generalized): Secondary | ICD-10-CM | POA: Diagnosis not present

## 2015-11-02 DIAGNOSIS — F331 Major depressive disorder, recurrent, moderate: Secondary | ICD-10-CM | POA: Diagnosis not present

## 2015-11-02 DIAGNOSIS — M6281 Muscle weakness (generalized): Secondary | ICD-10-CM | POA: Diagnosis not present

## 2015-11-02 DIAGNOSIS — G40909 Epilepsy, unspecified, not intractable, without status epilepticus: Secondary | ICD-10-CM | POA: Diagnosis not present

## 2015-11-02 DIAGNOSIS — G35 Multiple sclerosis: Secondary | ICD-10-CM | POA: Diagnosis not present

## 2015-11-02 DIAGNOSIS — F411 Generalized anxiety disorder: Secondary | ICD-10-CM | POA: Diagnosis not present

## 2015-11-02 DIAGNOSIS — R279 Unspecified lack of coordination: Secondary | ICD-10-CM | POA: Diagnosis not present

## 2015-11-02 DIAGNOSIS — M6282 Rhabdomyolysis: Secondary | ICD-10-CM | POA: Diagnosis not present

## 2015-11-03 DIAGNOSIS — G40909 Epilepsy, unspecified, not intractable, without status epilepticus: Secondary | ICD-10-CM | POA: Diagnosis not present

## 2015-11-03 DIAGNOSIS — F411 Generalized anxiety disorder: Secondary | ICD-10-CM | POA: Diagnosis not present

## 2015-11-03 DIAGNOSIS — M6281 Muscle weakness (generalized): Secondary | ICD-10-CM | POA: Diagnosis not present

## 2015-11-03 DIAGNOSIS — R279 Unspecified lack of coordination: Secondary | ICD-10-CM | POA: Diagnosis not present

## 2015-11-03 DIAGNOSIS — G35 Multiple sclerosis: Secondary | ICD-10-CM | POA: Diagnosis not present

## 2015-11-03 DIAGNOSIS — M6282 Rhabdomyolysis: Secondary | ICD-10-CM | POA: Diagnosis not present

## 2015-11-04 DIAGNOSIS — G40909 Epilepsy, unspecified, not intractable, without status epilepticus: Secondary | ICD-10-CM | POA: Diagnosis not present

## 2015-11-04 DIAGNOSIS — R279 Unspecified lack of coordination: Secondary | ICD-10-CM | POA: Diagnosis not present

## 2015-11-04 DIAGNOSIS — F411 Generalized anxiety disorder: Secondary | ICD-10-CM | POA: Diagnosis not present

## 2015-11-04 DIAGNOSIS — M6282 Rhabdomyolysis: Secondary | ICD-10-CM | POA: Diagnosis not present

## 2015-11-04 DIAGNOSIS — M6281 Muscle weakness (generalized): Secondary | ICD-10-CM | POA: Diagnosis not present

## 2015-11-04 DIAGNOSIS — G35 Multiple sclerosis: Secondary | ICD-10-CM | POA: Diagnosis not present

## 2015-11-04 DIAGNOSIS — F331 Major depressive disorder, recurrent, moderate: Secondary | ICD-10-CM | POA: Diagnosis not present

## 2015-11-05 DIAGNOSIS — F411 Generalized anxiety disorder: Secondary | ICD-10-CM | POA: Diagnosis not present

## 2015-11-05 DIAGNOSIS — M6282 Rhabdomyolysis: Secondary | ICD-10-CM | POA: Diagnosis not present

## 2015-11-05 DIAGNOSIS — G35 Multiple sclerosis: Secondary | ICD-10-CM | POA: Diagnosis not present

## 2015-11-05 DIAGNOSIS — R279 Unspecified lack of coordination: Secondary | ICD-10-CM | POA: Diagnosis not present

## 2015-11-05 DIAGNOSIS — G9349 Other encephalopathy: Secondary | ICD-10-CM | POA: Diagnosis not present

## 2015-11-05 DIAGNOSIS — G40909 Epilepsy, unspecified, not intractable, without status epilepticus: Secondary | ICD-10-CM | POA: Diagnosis not present

## 2015-11-05 DIAGNOSIS — M6281 Muscle weakness (generalized): Secondary | ICD-10-CM | POA: Diagnosis not present

## 2015-11-05 DIAGNOSIS — I638 Other cerebral infarction: Secondary | ICD-10-CM | POA: Diagnosis not present

## 2015-11-07 DIAGNOSIS — F331 Major depressive disorder, recurrent, moderate: Secondary | ICD-10-CM | POA: Diagnosis not present

## 2015-11-08 DIAGNOSIS — F411 Generalized anxiety disorder: Secondary | ICD-10-CM | POA: Diagnosis not present

## 2015-11-08 DIAGNOSIS — M6281 Muscle weakness (generalized): Secondary | ICD-10-CM | POA: Diagnosis not present

## 2015-11-08 DIAGNOSIS — M6282 Rhabdomyolysis: Secondary | ICD-10-CM | POA: Diagnosis not present

## 2015-11-08 DIAGNOSIS — G35 Multiple sclerosis: Secondary | ICD-10-CM | POA: Diagnosis not present

## 2015-11-08 DIAGNOSIS — G40909 Epilepsy, unspecified, not intractable, without status epilepticus: Secondary | ICD-10-CM | POA: Diagnosis not present

## 2015-11-08 DIAGNOSIS — R279 Unspecified lack of coordination: Secondary | ICD-10-CM | POA: Diagnosis not present

## 2015-11-09 DIAGNOSIS — F411 Generalized anxiety disorder: Secondary | ICD-10-CM | POA: Diagnosis not present

## 2015-11-09 DIAGNOSIS — M6281 Muscle weakness (generalized): Secondary | ICD-10-CM | POA: Diagnosis not present

## 2015-11-09 DIAGNOSIS — R279 Unspecified lack of coordination: Secondary | ICD-10-CM | POA: Diagnosis not present

## 2015-11-09 DIAGNOSIS — G35 Multiple sclerosis: Secondary | ICD-10-CM | POA: Diagnosis not present

## 2015-11-09 DIAGNOSIS — G40909 Epilepsy, unspecified, not intractable, without status epilepticus: Secondary | ICD-10-CM | POA: Diagnosis not present

## 2015-11-09 DIAGNOSIS — M6282 Rhabdomyolysis: Secondary | ICD-10-CM | POA: Diagnosis not present

## 2015-11-09 DIAGNOSIS — E1151 Type 2 diabetes mellitus with diabetic peripheral angiopathy without gangrene: Secondary | ICD-10-CM | POA: Diagnosis not present

## 2015-11-09 DIAGNOSIS — F331 Major depressive disorder, recurrent, moderate: Secondary | ICD-10-CM | POA: Diagnosis not present

## 2015-11-10 DIAGNOSIS — R279 Unspecified lack of coordination: Secondary | ICD-10-CM | POA: Diagnosis not present

## 2015-11-10 DIAGNOSIS — F411 Generalized anxiety disorder: Secondary | ICD-10-CM | POA: Diagnosis not present

## 2015-11-10 DIAGNOSIS — M6281 Muscle weakness (generalized): Secondary | ICD-10-CM | POA: Diagnosis not present

## 2015-11-10 DIAGNOSIS — G35 Multiple sclerosis: Secondary | ICD-10-CM | POA: Diagnosis not present

## 2015-11-10 DIAGNOSIS — G40909 Epilepsy, unspecified, not intractable, without status epilepticus: Secondary | ICD-10-CM | POA: Diagnosis not present

## 2015-11-10 DIAGNOSIS — M6282 Rhabdomyolysis: Secondary | ICD-10-CM | POA: Diagnosis not present

## 2015-11-11 DIAGNOSIS — M6281 Muscle weakness (generalized): Secondary | ICD-10-CM | POA: Diagnosis not present

## 2015-11-11 DIAGNOSIS — R279 Unspecified lack of coordination: Secondary | ICD-10-CM | POA: Diagnosis not present

## 2015-11-11 DIAGNOSIS — F411 Generalized anxiety disorder: Secondary | ICD-10-CM | POA: Diagnosis not present

## 2015-11-11 DIAGNOSIS — M6282 Rhabdomyolysis: Secondary | ICD-10-CM | POA: Diagnosis not present

## 2015-11-11 DIAGNOSIS — G35 Multiple sclerosis: Secondary | ICD-10-CM | POA: Diagnosis not present

## 2015-11-11 DIAGNOSIS — G40909 Epilepsy, unspecified, not intractable, without status epilepticus: Secondary | ICD-10-CM | POA: Diagnosis not present

## 2015-11-12 DIAGNOSIS — G35 Multiple sclerosis: Secondary | ICD-10-CM | POA: Diagnosis not present

## 2015-11-12 DIAGNOSIS — M6281 Muscle weakness (generalized): Secondary | ICD-10-CM | POA: Diagnosis not present

## 2015-11-12 DIAGNOSIS — G40909 Epilepsy, unspecified, not intractable, without status epilepticus: Secondary | ICD-10-CM | POA: Diagnosis not present

## 2015-11-12 DIAGNOSIS — M6282 Rhabdomyolysis: Secondary | ICD-10-CM | POA: Diagnosis not present

## 2015-11-12 DIAGNOSIS — R279 Unspecified lack of coordination: Secondary | ICD-10-CM | POA: Diagnosis not present

## 2015-11-12 DIAGNOSIS — F411 Generalized anxiety disorder: Secondary | ICD-10-CM | POA: Diagnosis not present

## 2015-11-15 DIAGNOSIS — G35 Multiple sclerosis: Secondary | ICD-10-CM | POA: Diagnosis not present

## 2015-11-15 DIAGNOSIS — F411 Generalized anxiety disorder: Secondary | ICD-10-CM | POA: Diagnosis not present

## 2015-11-15 DIAGNOSIS — R279 Unspecified lack of coordination: Secondary | ICD-10-CM | POA: Diagnosis not present

## 2015-11-15 DIAGNOSIS — M6282 Rhabdomyolysis: Secondary | ICD-10-CM | POA: Diagnosis not present

## 2015-11-15 DIAGNOSIS — M6281 Muscle weakness (generalized): Secondary | ICD-10-CM | POA: Diagnosis not present

## 2015-11-15 DIAGNOSIS — G40909 Epilepsy, unspecified, not intractable, without status epilepticus: Secondary | ICD-10-CM | POA: Diagnosis not present

## 2015-11-16 DIAGNOSIS — G35 Multiple sclerosis: Secondary | ICD-10-CM | POA: Diagnosis not present

## 2015-11-16 DIAGNOSIS — R279 Unspecified lack of coordination: Secondary | ICD-10-CM | POA: Diagnosis not present

## 2015-11-16 DIAGNOSIS — F411 Generalized anxiety disorder: Secondary | ICD-10-CM | POA: Diagnosis not present

## 2015-11-16 DIAGNOSIS — M6281 Muscle weakness (generalized): Secondary | ICD-10-CM | POA: Diagnosis not present

## 2015-11-16 DIAGNOSIS — G40909 Epilepsy, unspecified, not intractable, without status epilepticus: Secondary | ICD-10-CM | POA: Diagnosis not present

## 2015-11-16 DIAGNOSIS — M6282 Rhabdomyolysis: Secondary | ICD-10-CM | POA: Diagnosis not present

## 2015-11-17 DIAGNOSIS — F331 Major depressive disorder, recurrent, moderate: Secondary | ICD-10-CM | POA: Diagnosis not present

## 2015-11-17 DIAGNOSIS — M6282 Rhabdomyolysis: Secondary | ICD-10-CM | POA: Diagnosis not present

## 2015-11-17 DIAGNOSIS — R279 Unspecified lack of coordination: Secondary | ICD-10-CM | POA: Diagnosis not present

## 2015-11-17 DIAGNOSIS — M6281 Muscle weakness (generalized): Secondary | ICD-10-CM | POA: Diagnosis not present

## 2015-11-17 DIAGNOSIS — G40909 Epilepsy, unspecified, not intractable, without status epilepticus: Secondary | ICD-10-CM | POA: Diagnosis not present

## 2015-11-17 DIAGNOSIS — G35 Multiple sclerosis: Secondary | ICD-10-CM | POA: Diagnosis not present

## 2015-11-17 DIAGNOSIS — F411 Generalized anxiety disorder: Secondary | ICD-10-CM | POA: Diagnosis not present

## 2015-11-18 DIAGNOSIS — M6281 Muscle weakness (generalized): Secondary | ICD-10-CM | POA: Diagnosis not present

## 2015-11-18 DIAGNOSIS — G40909 Epilepsy, unspecified, not intractable, without status epilepticus: Secondary | ICD-10-CM | POA: Diagnosis not present

## 2015-11-18 DIAGNOSIS — G35 Multiple sclerosis: Secondary | ICD-10-CM | POA: Diagnosis not present

## 2015-11-18 DIAGNOSIS — F411 Generalized anxiety disorder: Secondary | ICD-10-CM | POA: Diagnosis not present

## 2015-11-18 DIAGNOSIS — M6282 Rhabdomyolysis: Secondary | ICD-10-CM | POA: Diagnosis not present

## 2015-11-18 DIAGNOSIS — R279 Unspecified lack of coordination: Secondary | ICD-10-CM | POA: Diagnosis not present

## 2015-11-19 DIAGNOSIS — G40909 Epilepsy, unspecified, not intractable, without status epilepticus: Secondary | ICD-10-CM | POA: Diagnosis not present

## 2015-11-19 DIAGNOSIS — M6281 Muscle weakness (generalized): Secondary | ICD-10-CM | POA: Diagnosis not present

## 2015-11-19 DIAGNOSIS — M6282 Rhabdomyolysis: Secondary | ICD-10-CM | POA: Diagnosis not present

## 2015-11-19 DIAGNOSIS — G35 Multiple sclerosis: Secondary | ICD-10-CM | POA: Diagnosis not present

## 2015-11-19 DIAGNOSIS — R279 Unspecified lack of coordination: Secondary | ICD-10-CM | POA: Diagnosis not present

## 2015-11-19 DIAGNOSIS — F411 Generalized anxiety disorder: Secondary | ICD-10-CM | POA: Diagnosis not present

## 2015-11-22 DIAGNOSIS — F411 Generalized anxiety disorder: Secondary | ICD-10-CM | POA: Diagnosis not present

## 2015-11-22 DIAGNOSIS — G40909 Epilepsy, unspecified, not intractable, without status epilepticus: Secondary | ICD-10-CM | POA: Diagnosis not present

## 2015-11-22 DIAGNOSIS — G35 Multiple sclerosis: Secondary | ICD-10-CM | POA: Diagnosis not present

## 2015-11-22 DIAGNOSIS — M6281 Muscle weakness (generalized): Secondary | ICD-10-CM | POA: Diagnosis not present

## 2015-11-22 DIAGNOSIS — R279 Unspecified lack of coordination: Secondary | ICD-10-CM | POA: Diagnosis not present

## 2015-11-22 DIAGNOSIS — M6282 Rhabdomyolysis: Secondary | ICD-10-CM | POA: Diagnosis not present

## 2015-11-23 DIAGNOSIS — M6281 Muscle weakness (generalized): Secondary | ICD-10-CM | POA: Diagnosis not present

## 2015-11-23 DIAGNOSIS — G35 Multiple sclerosis: Secondary | ICD-10-CM | POA: Diagnosis not present

## 2015-11-23 DIAGNOSIS — R279 Unspecified lack of coordination: Secondary | ICD-10-CM | POA: Diagnosis not present

## 2015-11-23 DIAGNOSIS — M6282 Rhabdomyolysis: Secondary | ICD-10-CM | POA: Diagnosis not present

## 2015-11-23 DIAGNOSIS — G40909 Epilepsy, unspecified, not intractable, without status epilepticus: Secondary | ICD-10-CM | POA: Diagnosis not present

## 2015-11-23 DIAGNOSIS — F411 Generalized anxiety disorder: Secondary | ICD-10-CM | POA: Diagnosis not present

## 2015-11-24 DIAGNOSIS — R279 Unspecified lack of coordination: Secondary | ICD-10-CM | POA: Diagnosis not present

## 2015-11-24 DIAGNOSIS — G40909 Epilepsy, unspecified, not intractable, without status epilepticus: Secondary | ICD-10-CM | POA: Diagnosis not present

## 2015-11-24 DIAGNOSIS — G35 Multiple sclerosis: Secondary | ICD-10-CM | POA: Diagnosis not present

## 2015-11-24 DIAGNOSIS — M6282 Rhabdomyolysis: Secondary | ICD-10-CM | POA: Diagnosis not present

## 2015-11-24 DIAGNOSIS — M6281 Muscle weakness (generalized): Secondary | ICD-10-CM | POA: Diagnosis not present

## 2015-11-24 DIAGNOSIS — F411 Generalized anxiety disorder: Secondary | ICD-10-CM | POA: Diagnosis not present

## 2015-11-25 DIAGNOSIS — R279 Unspecified lack of coordination: Secondary | ICD-10-CM | POA: Diagnosis not present

## 2015-11-25 DIAGNOSIS — M6281 Muscle weakness (generalized): Secondary | ICD-10-CM | POA: Diagnosis not present

## 2015-11-25 DIAGNOSIS — G35 Multiple sclerosis: Secondary | ICD-10-CM | POA: Diagnosis not present

## 2015-11-25 DIAGNOSIS — M6282 Rhabdomyolysis: Secondary | ICD-10-CM | POA: Diagnosis not present

## 2015-11-25 DIAGNOSIS — F411 Generalized anxiety disorder: Secondary | ICD-10-CM | POA: Diagnosis not present

## 2015-11-25 DIAGNOSIS — G40909 Epilepsy, unspecified, not intractable, without status epilepticus: Secondary | ICD-10-CM | POA: Diagnosis not present

## 2015-11-26 DIAGNOSIS — F411 Generalized anxiety disorder: Secondary | ICD-10-CM | POA: Diagnosis not present

## 2015-11-26 DIAGNOSIS — G35 Multiple sclerosis: Secondary | ICD-10-CM | POA: Diagnosis not present

## 2015-11-26 DIAGNOSIS — R279 Unspecified lack of coordination: Secondary | ICD-10-CM | POA: Diagnosis not present

## 2015-11-26 DIAGNOSIS — M6281 Muscle weakness (generalized): Secondary | ICD-10-CM | POA: Diagnosis not present

## 2015-11-26 DIAGNOSIS — M6282 Rhabdomyolysis: Secondary | ICD-10-CM | POA: Diagnosis not present

## 2015-11-26 DIAGNOSIS — G40909 Epilepsy, unspecified, not intractable, without status epilepticus: Secondary | ICD-10-CM | POA: Diagnosis not present

## 2015-11-29 DIAGNOSIS — R279 Unspecified lack of coordination: Secondary | ICD-10-CM | POA: Diagnosis not present

## 2015-11-29 DIAGNOSIS — M6281 Muscle weakness (generalized): Secondary | ICD-10-CM | POA: Diagnosis not present

## 2015-11-29 DIAGNOSIS — G40909 Epilepsy, unspecified, not intractable, without status epilepticus: Secondary | ICD-10-CM | POA: Diagnosis not present

## 2015-11-29 DIAGNOSIS — F411 Generalized anxiety disorder: Secondary | ICD-10-CM | POA: Diagnosis not present

## 2015-11-29 DIAGNOSIS — M6282 Rhabdomyolysis: Secondary | ICD-10-CM | POA: Diagnosis not present

## 2015-11-29 DIAGNOSIS — G35 Multiple sclerosis: Secondary | ICD-10-CM | POA: Diagnosis not present

## 2015-11-30 DIAGNOSIS — M6282 Rhabdomyolysis: Secondary | ICD-10-CM | POA: Diagnosis not present

## 2015-11-30 DIAGNOSIS — G40909 Epilepsy, unspecified, not intractable, without status epilepticus: Secondary | ICD-10-CM | POA: Diagnosis not present

## 2015-11-30 DIAGNOSIS — M6281 Muscle weakness (generalized): Secondary | ICD-10-CM | POA: Diagnosis not present

## 2015-11-30 DIAGNOSIS — F411 Generalized anxiety disorder: Secondary | ICD-10-CM | POA: Diagnosis not present

## 2015-11-30 DIAGNOSIS — G35 Multiple sclerosis: Secondary | ICD-10-CM | POA: Diagnosis not present

## 2015-11-30 DIAGNOSIS — R279 Unspecified lack of coordination: Secondary | ICD-10-CM | POA: Diagnosis not present

## 2015-12-01 DIAGNOSIS — G35 Multiple sclerosis: Secondary | ICD-10-CM | POA: Diagnosis not present

## 2015-12-01 DIAGNOSIS — G40909 Epilepsy, unspecified, not intractable, without status epilepticus: Secondary | ICD-10-CM | POA: Diagnosis not present

## 2015-12-01 DIAGNOSIS — M6282 Rhabdomyolysis: Secondary | ICD-10-CM | POA: Diagnosis not present

## 2015-12-01 DIAGNOSIS — F411 Generalized anxiety disorder: Secondary | ICD-10-CM | POA: Diagnosis not present

## 2015-12-01 DIAGNOSIS — M6281 Muscle weakness (generalized): Secondary | ICD-10-CM | POA: Diagnosis not present

## 2015-12-01 DIAGNOSIS — R279 Unspecified lack of coordination: Secondary | ICD-10-CM | POA: Diagnosis not present

## 2015-12-02 DIAGNOSIS — G35 Multiple sclerosis: Secondary | ICD-10-CM | POA: Diagnosis not present

## 2015-12-02 DIAGNOSIS — R279 Unspecified lack of coordination: Secondary | ICD-10-CM | POA: Diagnosis not present

## 2015-12-02 DIAGNOSIS — M6281 Muscle weakness (generalized): Secondary | ICD-10-CM | POA: Diagnosis not present

## 2015-12-02 DIAGNOSIS — G40909 Epilepsy, unspecified, not intractable, without status epilepticus: Secondary | ICD-10-CM | POA: Diagnosis not present

## 2015-12-02 DIAGNOSIS — F411 Generalized anxiety disorder: Secondary | ICD-10-CM | POA: Diagnosis not present

## 2015-12-02 DIAGNOSIS — M6282 Rhabdomyolysis: Secondary | ICD-10-CM | POA: Diagnosis not present

## 2015-12-03 DIAGNOSIS — R740 Nonspecific elevation of levels of transaminase and lactic acid dehydrogenase [LDH]: Secondary | ICD-10-CM | POA: Diagnosis not present

## 2015-12-03 DIAGNOSIS — M6282 Rhabdomyolysis: Secondary | ICD-10-CM | POA: Diagnosis not present

## 2015-12-03 DIAGNOSIS — M6281 Muscle weakness (generalized): Secondary | ICD-10-CM | POA: Diagnosis not present

## 2015-12-03 DIAGNOSIS — G40909 Epilepsy, unspecified, not intractable, without status epilepticus: Secondary | ICD-10-CM | POA: Diagnosis not present

## 2015-12-03 DIAGNOSIS — I10 Essential (primary) hypertension: Secondary | ICD-10-CM | POA: Diagnosis not present

## 2015-12-03 DIAGNOSIS — R279 Unspecified lack of coordination: Secondary | ICD-10-CM | POA: Diagnosis not present

## 2015-12-03 DIAGNOSIS — Z5181 Encounter for therapeutic drug level monitoring: Secondary | ICD-10-CM | POA: Diagnosis not present

## 2015-12-03 DIAGNOSIS — F411 Generalized anxiety disorder: Secondary | ICD-10-CM | POA: Diagnosis not present

## 2015-12-03 DIAGNOSIS — G35 Multiple sclerosis: Secondary | ICD-10-CM | POA: Diagnosis not present

## 2015-12-03 DIAGNOSIS — F331 Major depressive disorder, recurrent, moderate: Secondary | ICD-10-CM | POA: Diagnosis not present

## 2015-12-03 DIAGNOSIS — E785 Hyperlipidemia, unspecified: Secondary | ICD-10-CM | POA: Diagnosis not present

## 2015-12-06 DIAGNOSIS — R279 Unspecified lack of coordination: Secondary | ICD-10-CM | POA: Diagnosis not present

## 2015-12-06 DIAGNOSIS — G40909 Epilepsy, unspecified, not intractable, without status epilepticus: Secondary | ICD-10-CM | POA: Diagnosis not present

## 2015-12-06 DIAGNOSIS — F411 Generalized anxiety disorder: Secondary | ICD-10-CM | POA: Diagnosis not present

## 2015-12-06 DIAGNOSIS — M6282 Rhabdomyolysis: Secondary | ICD-10-CM | POA: Diagnosis not present

## 2015-12-06 DIAGNOSIS — G35 Multiple sclerosis: Secondary | ICD-10-CM | POA: Diagnosis not present

## 2015-12-06 DIAGNOSIS — M6281 Muscle weakness (generalized): Secondary | ICD-10-CM | POA: Diagnosis not present

## 2015-12-07 DIAGNOSIS — R279 Unspecified lack of coordination: Secondary | ICD-10-CM | POA: Diagnosis not present

## 2015-12-07 DIAGNOSIS — F411 Generalized anxiety disorder: Secondary | ICD-10-CM | POA: Diagnosis not present

## 2015-12-07 DIAGNOSIS — M6282 Rhabdomyolysis: Secondary | ICD-10-CM | POA: Diagnosis not present

## 2015-12-07 DIAGNOSIS — M6281 Muscle weakness (generalized): Secondary | ICD-10-CM | POA: Diagnosis not present

## 2015-12-07 DIAGNOSIS — G35 Multiple sclerosis: Secondary | ICD-10-CM | POA: Diagnosis not present

## 2015-12-07 DIAGNOSIS — F331 Major depressive disorder, recurrent, moderate: Secondary | ICD-10-CM | POA: Diagnosis not present

## 2015-12-07 DIAGNOSIS — G40909 Epilepsy, unspecified, not intractable, without status epilepticus: Secondary | ICD-10-CM | POA: Diagnosis not present

## 2015-12-08 DIAGNOSIS — G35 Multiple sclerosis: Secondary | ICD-10-CM | POA: Diagnosis not present

## 2015-12-08 DIAGNOSIS — R279 Unspecified lack of coordination: Secondary | ICD-10-CM | POA: Diagnosis not present

## 2015-12-08 DIAGNOSIS — M6282 Rhabdomyolysis: Secondary | ICD-10-CM | POA: Diagnosis not present

## 2015-12-08 DIAGNOSIS — M6281 Muscle weakness (generalized): Secondary | ICD-10-CM | POA: Diagnosis not present

## 2015-12-08 DIAGNOSIS — G40909 Epilepsy, unspecified, not intractable, without status epilepticus: Secondary | ICD-10-CM | POA: Diagnosis not present

## 2015-12-08 DIAGNOSIS — F411 Generalized anxiety disorder: Secondary | ICD-10-CM | POA: Diagnosis not present

## 2015-12-09 DIAGNOSIS — M6281 Muscle weakness (generalized): Secondary | ICD-10-CM | POA: Diagnosis not present

## 2015-12-09 DIAGNOSIS — G35 Multiple sclerosis: Secondary | ICD-10-CM | POA: Diagnosis not present

## 2015-12-09 DIAGNOSIS — R279 Unspecified lack of coordination: Secondary | ICD-10-CM | POA: Diagnosis not present

## 2015-12-09 DIAGNOSIS — G40909 Epilepsy, unspecified, not intractable, without status epilepticus: Secondary | ICD-10-CM | POA: Diagnosis not present

## 2015-12-09 DIAGNOSIS — M6282 Rhabdomyolysis: Secondary | ICD-10-CM | POA: Diagnosis not present

## 2015-12-09 DIAGNOSIS — F411 Generalized anxiety disorder: Secondary | ICD-10-CM | POA: Diagnosis not present

## 2015-12-10 DIAGNOSIS — G35 Multiple sclerosis: Secondary | ICD-10-CM | POA: Diagnosis not present

## 2015-12-10 DIAGNOSIS — M6281 Muscle weakness (generalized): Secondary | ICD-10-CM | POA: Diagnosis not present

## 2015-12-10 DIAGNOSIS — G40909 Epilepsy, unspecified, not intractable, without status epilepticus: Secondary | ICD-10-CM | POA: Diagnosis not present

## 2015-12-10 DIAGNOSIS — F411 Generalized anxiety disorder: Secondary | ICD-10-CM | POA: Diagnosis not present

## 2015-12-10 DIAGNOSIS — R279 Unspecified lack of coordination: Secondary | ICD-10-CM | POA: Diagnosis not present

## 2015-12-10 DIAGNOSIS — M6282 Rhabdomyolysis: Secondary | ICD-10-CM | POA: Diagnosis not present

## 2015-12-11 DIAGNOSIS — F331 Major depressive disorder, recurrent, moderate: Secondary | ICD-10-CM | POA: Diagnosis not present

## 2015-12-14 DIAGNOSIS — F331 Major depressive disorder, recurrent, moderate: Secondary | ICD-10-CM | POA: Diagnosis not present

## 2015-12-16 DIAGNOSIS — F331 Major depressive disorder, recurrent, moderate: Secondary | ICD-10-CM | POA: Diagnosis not present

## 2015-12-20 DIAGNOSIS — I638 Other cerebral infarction: Secondary | ICD-10-CM | POA: Diagnosis not present

## 2015-12-20 DIAGNOSIS — G35 Multiple sclerosis: Secondary | ICD-10-CM | POA: Diagnosis not present

## 2015-12-20 DIAGNOSIS — G9349 Other encephalopathy: Secondary | ICD-10-CM | POA: Diagnosis not present

## 2015-12-23 DIAGNOSIS — F331 Major depressive disorder, recurrent, moderate: Secondary | ICD-10-CM | POA: Diagnosis not present

## 2015-12-25 DIAGNOSIS — F331 Major depressive disorder, recurrent, moderate: Secondary | ICD-10-CM | POA: Diagnosis not present

## 2015-12-30 DIAGNOSIS — F331 Major depressive disorder, recurrent, moderate: Secondary | ICD-10-CM | POA: Diagnosis not present

## 2016-01-01 DIAGNOSIS — F331 Major depressive disorder, recurrent, moderate: Secondary | ICD-10-CM | POA: Diagnosis not present

## 2016-01-03 DIAGNOSIS — F29 Unspecified psychosis not due to a substance or known physiological condition: Secondary | ICD-10-CM | POA: Diagnosis not present

## 2016-01-03 DIAGNOSIS — F419 Anxiety disorder, unspecified: Secondary | ICD-10-CM | POA: Diagnosis not present

## 2016-01-03 DIAGNOSIS — F329 Major depressive disorder, single episode, unspecified: Secondary | ICD-10-CM | POA: Diagnosis not present

## 2016-01-03 DIAGNOSIS — F319 Bipolar disorder, unspecified: Secondary | ICD-10-CM | POA: Diagnosis not present

## 2016-01-05 DIAGNOSIS — G4459 Other complicated headache syndrome: Secondary | ICD-10-CM | POA: Diagnosis not present

## 2016-01-05 DIAGNOSIS — R2681 Unsteadiness on feet: Secondary | ICD-10-CM | POA: Diagnosis not present

## 2016-01-05 DIAGNOSIS — I6990 Unspecified sequelae of unspecified cerebrovascular disease: Secondary | ICD-10-CM | POA: Diagnosis not present

## 2016-01-05 DIAGNOSIS — R569 Unspecified convulsions: Secondary | ICD-10-CM | POA: Diagnosis not present

## 2016-01-05 DIAGNOSIS — G35 Multiple sclerosis: Secondary | ICD-10-CM | POA: Diagnosis not present

## 2016-01-05 DIAGNOSIS — Z79899 Other long term (current) drug therapy: Secondary | ICD-10-CM | POA: Diagnosis not present

## 2016-01-05 DIAGNOSIS — F5109 Other insomnia not due to a substance or known physiological condition: Secondary | ICD-10-CM | POA: Diagnosis not present

## 2016-01-05 DIAGNOSIS — F329 Major depressive disorder, single episode, unspecified: Secondary | ICD-10-CM | POA: Diagnosis not present

## 2016-01-11 DIAGNOSIS — E1151 Type 2 diabetes mellitus with diabetic peripheral angiopathy without gangrene: Secondary | ICD-10-CM | POA: Diagnosis not present

## 2016-01-12 DIAGNOSIS — F331 Major depressive disorder, recurrent, moderate: Secondary | ICD-10-CM | POA: Diagnosis not present

## 2016-01-15 DIAGNOSIS — F331 Major depressive disorder, recurrent, moderate: Secondary | ICD-10-CM | POA: Diagnosis not present

## 2016-01-21 DIAGNOSIS — E119 Type 2 diabetes mellitus without complications: Secondary | ICD-10-CM | POA: Diagnosis not present

## 2016-01-22 DIAGNOSIS — F331 Major depressive disorder, recurrent, moderate: Secondary | ICD-10-CM | POA: Diagnosis not present

## 2016-02-03 DIAGNOSIS — G9349 Other encephalopathy: Secondary | ICD-10-CM | POA: Diagnosis not present

## 2016-02-03 DIAGNOSIS — G35 Multiple sclerosis: Secondary | ICD-10-CM | POA: Diagnosis not present

## 2016-02-03 DIAGNOSIS — I638 Other cerebral infarction: Secondary | ICD-10-CM | POA: Diagnosis not present

## 2016-02-18 DIAGNOSIS — Z23 Encounter for immunization: Secondary | ICD-10-CM | POA: Diagnosis not present

## 2016-02-26 DIAGNOSIS — F331 Major depressive disorder, recurrent, moderate: Secondary | ICD-10-CM | POA: Diagnosis not present

## 2016-02-29 DIAGNOSIS — F331 Major depressive disorder, recurrent, moderate: Secondary | ICD-10-CM | POA: Diagnosis not present

## 2016-03-05 DIAGNOSIS — F331 Major depressive disorder, recurrent, moderate: Secondary | ICD-10-CM | POA: Diagnosis not present

## 2016-03-10 DIAGNOSIS — Z23 Encounter for immunization: Secondary | ICD-10-CM | POA: Diagnosis not present

## 2016-03-12 DIAGNOSIS — F331 Major depressive disorder, recurrent, moderate: Secondary | ICD-10-CM | POA: Diagnosis not present

## 2016-03-14 DIAGNOSIS — F331 Major depressive disorder, recurrent, moderate: Secondary | ICD-10-CM | POA: Diagnosis not present

## 2016-03-16 DIAGNOSIS — G9349 Other encephalopathy: Secondary | ICD-10-CM | POA: Diagnosis not present

## 2016-03-16 DIAGNOSIS — I638 Other cerebral infarction: Secondary | ICD-10-CM | POA: Diagnosis not present

## 2016-03-16 DIAGNOSIS — G35 Multiple sclerosis: Secondary | ICD-10-CM | POA: Diagnosis not present

## 2016-03-19 DIAGNOSIS — F331 Major depressive disorder, recurrent, moderate: Secondary | ICD-10-CM | POA: Diagnosis not present

## 2016-03-24 DIAGNOSIS — E1151 Type 2 diabetes mellitus with diabetic peripheral angiopathy without gangrene: Secondary | ICD-10-CM | POA: Diagnosis not present

## 2016-03-26 DIAGNOSIS — F331 Major depressive disorder, recurrent, moderate: Secondary | ICD-10-CM | POA: Diagnosis not present

## 2016-03-30 DIAGNOSIS — F331 Major depressive disorder, recurrent, moderate: Secondary | ICD-10-CM | POA: Diagnosis not present

## 2016-04-02 DIAGNOSIS — F331 Major depressive disorder, recurrent, moderate: Secondary | ICD-10-CM | POA: Diagnosis not present

## 2016-04-10 DIAGNOSIS — Z5181 Encounter for therapeutic drug level monitoring: Secondary | ICD-10-CM | POA: Diagnosis not present

## 2016-04-10 DIAGNOSIS — Z79899 Other long term (current) drug therapy: Secondary | ICD-10-CM | POA: Diagnosis not present

## 2016-04-10 DIAGNOSIS — G40909 Epilepsy, unspecified, not intractable, without status epilepticus: Secondary | ICD-10-CM | POA: Diagnosis not present

## 2016-05-01 DIAGNOSIS — G35 Multiple sclerosis: Secondary | ICD-10-CM | POA: Diagnosis not present

## 2016-05-01 DIAGNOSIS — I638 Other cerebral infarction: Secondary | ICD-10-CM | POA: Diagnosis not present

## 2016-05-01 DIAGNOSIS — G9349 Other encephalopathy: Secondary | ICD-10-CM | POA: Diagnosis not present

## 2016-05-07 DIAGNOSIS — M1711 Unilateral primary osteoarthritis, right knee: Secondary | ICD-10-CM | POA: Diagnosis not present

## 2016-05-15 DIAGNOSIS — F319 Bipolar disorder, unspecified: Secondary | ICD-10-CM | POA: Diagnosis not present

## 2016-05-15 DIAGNOSIS — F329 Major depressive disorder, single episode, unspecified: Secondary | ICD-10-CM | POA: Diagnosis not present

## 2016-05-15 DIAGNOSIS — F29 Unspecified psychosis not due to a substance or known physiological condition: Secondary | ICD-10-CM | POA: Diagnosis not present

## 2016-05-15 DIAGNOSIS — F419 Anxiety disorder, unspecified: Secondary | ICD-10-CM | POA: Diagnosis not present

## 2016-05-17 DIAGNOSIS — F411 Generalized anxiety disorder: Secondary | ICD-10-CM | POA: Diagnosis not present

## 2016-05-17 DIAGNOSIS — M6282 Rhabdomyolysis: Secondary | ICD-10-CM | POA: Diagnosis not present

## 2016-05-17 DIAGNOSIS — R488 Other symbolic dysfunctions: Secondary | ICD-10-CM | POA: Diagnosis not present

## 2016-05-17 DIAGNOSIS — G35 Multiple sclerosis: Secondary | ICD-10-CM | POA: Diagnosis not present

## 2016-05-17 DIAGNOSIS — R279 Unspecified lack of coordination: Secondary | ICD-10-CM | POA: Diagnosis not present

## 2016-05-17 DIAGNOSIS — M6281 Muscle weakness (generalized): Secondary | ICD-10-CM | POA: Diagnosis not present

## 2016-05-17 DIAGNOSIS — Z5181 Encounter for therapeutic drug level monitoring: Secondary | ICD-10-CM | POA: Diagnosis not present

## 2016-05-17 DIAGNOSIS — G40909 Epilepsy, unspecified, not intractable, without status epilepticus: Secondary | ICD-10-CM | POA: Diagnosis not present

## 2016-05-18 DIAGNOSIS — G35 Multiple sclerosis: Secondary | ICD-10-CM | POA: Diagnosis not present

## 2016-05-18 DIAGNOSIS — G40909 Epilepsy, unspecified, not intractable, without status epilepticus: Secondary | ICD-10-CM | POA: Diagnosis not present

## 2016-05-18 DIAGNOSIS — M6282 Rhabdomyolysis: Secondary | ICD-10-CM | POA: Diagnosis not present

## 2016-05-18 DIAGNOSIS — R279 Unspecified lack of coordination: Secondary | ICD-10-CM | POA: Diagnosis not present

## 2016-05-18 DIAGNOSIS — M6281 Muscle weakness (generalized): Secondary | ICD-10-CM | POA: Diagnosis not present

## 2016-05-18 DIAGNOSIS — F411 Generalized anxiety disorder: Secondary | ICD-10-CM | POA: Diagnosis not present

## 2016-05-19 DIAGNOSIS — M6281 Muscle weakness (generalized): Secondary | ICD-10-CM | POA: Diagnosis not present

## 2016-05-19 DIAGNOSIS — F411 Generalized anxiety disorder: Secondary | ICD-10-CM | POA: Diagnosis not present

## 2016-05-19 DIAGNOSIS — G35 Multiple sclerosis: Secondary | ICD-10-CM | POA: Diagnosis not present

## 2016-05-19 DIAGNOSIS — G40909 Epilepsy, unspecified, not intractable, without status epilepticus: Secondary | ICD-10-CM | POA: Diagnosis not present

## 2016-05-19 DIAGNOSIS — M6282 Rhabdomyolysis: Secondary | ICD-10-CM | POA: Diagnosis not present

## 2016-05-19 DIAGNOSIS — R279 Unspecified lack of coordination: Secondary | ICD-10-CM | POA: Diagnosis not present

## 2016-05-21 DIAGNOSIS — G40909 Epilepsy, unspecified, not intractable, without status epilepticus: Secondary | ICD-10-CM | POA: Diagnosis not present

## 2016-05-21 DIAGNOSIS — G35 Multiple sclerosis: Secondary | ICD-10-CM | POA: Diagnosis not present

## 2016-05-21 DIAGNOSIS — M6281 Muscle weakness (generalized): Secondary | ICD-10-CM | POA: Diagnosis not present

## 2016-05-21 DIAGNOSIS — F411 Generalized anxiety disorder: Secondary | ICD-10-CM | POA: Diagnosis not present

## 2016-05-21 DIAGNOSIS — M6282 Rhabdomyolysis: Secondary | ICD-10-CM | POA: Diagnosis not present

## 2016-05-21 DIAGNOSIS — R279 Unspecified lack of coordination: Secondary | ICD-10-CM | POA: Diagnosis not present

## 2016-05-22 DIAGNOSIS — M6281 Muscle weakness (generalized): Secondary | ICD-10-CM | POA: Diagnosis not present

## 2016-05-22 DIAGNOSIS — G40909 Epilepsy, unspecified, not intractable, without status epilepticus: Secondary | ICD-10-CM | POA: Diagnosis not present

## 2016-05-22 DIAGNOSIS — M6282 Rhabdomyolysis: Secondary | ICD-10-CM | POA: Diagnosis not present

## 2016-05-22 DIAGNOSIS — G35 Multiple sclerosis: Secondary | ICD-10-CM | POA: Diagnosis not present

## 2016-05-22 DIAGNOSIS — R279 Unspecified lack of coordination: Secondary | ICD-10-CM | POA: Diagnosis not present

## 2016-05-22 DIAGNOSIS — F411 Generalized anxiety disorder: Secondary | ICD-10-CM | POA: Diagnosis not present

## 2016-05-23 DIAGNOSIS — M6282 Rhabdomyolysis: Secondary | ICD-10-CM | POA: Diagnosis not present

## 2016-05-23 DIAGNOSIS — F411 Generalized anxiety disorder: Secondary | ICD-10-CM | POA: Diagnosis not present

## 2016-05-23 DIAGNOSIS — G40909 Epilepsy, unspecified, not intractable, without status epilepticus: Secondary | ICD-10-CM | POA: Diagnosis not present

## 2016-05-23 DIAGNOSIS — G35 Multiple sclerosis: Secondary | ICD-10-CM | POA: Diagnosis not present

## 2016-05-23 DIAGNOSIS — R279 Unspecified lack of coordination: Secondary | ICD-10-CM | POA: Diagnosis not present

## 2016-05-23 DIAGNOSIS — M6281 Muscle weakness (generalized): Secondary | ICD-10-CM | POA: Diagnosis not present

## 2016-05-24 DIAGNOSIS — M6282 Rhabdomyolysis: Secondary | ICD-10-CM | POA: Diagnosis not present

## 2016-05-24 DIAGNOSIS — R279 Unspecified lack of coordination: Secondary | ICD-10-CM | POA: Diagnosis not present

## 2016-05-24 DIAGNOSIS — G40909 Epilepsy, unspecified, not intractable, without status epilepticus: Secondary | ICD-10-CM | POA: Diagnosis not present

## 2016-05-24 DIAGNOSIS — M6281 Muscle weakness (generalized): Secondary | ICD-10-CM | POA: Diagnosis not present

## 2016-05-24 DIAGNOSIS — F411 Generalized anxiety disorder: Secondary | ICD-10-CM | POA: Diagnosis not present

## 2016-05-24 DIAGNOSIS — G35 Multiple sclerosis: Secondary | ICD-10-CM | POA: Diagnosis not present

## 2016-05-25 DIAGNOSIS — R279 Unspecified lack of coordination: Secondary | ICD-10-CM | POA: Diagnosis not present

## 2016-05-25 DIAGNOSIS — G40909 Epilepsy, unspecified, not intractable, without status epilepticus: Secondary | ICD-10-CM | POA: Diagnosis not present

## 2016-05-25 DIAGNOSIS — M6282 Rhabdomyolysis: Secondary | ICD-10-CM | POA: Diagnosis not present

## 2016-05-25 DIAGNOSIS — G35 Multiple sclerosis: Secondary | ICD-10-CM | POA: Diagnosis not present

## 2016-05-25 DIAGNOSIS — F411 Generalized anxiety disorder: Secondary | ICD-10-CM | POA: Diagnosis not present

## 2016-05-25 DIAGNOSIS — M6281 Muscle weakness (generalized): Secondary | ICD-10-CM | POA: Diagnosis not present

## 2016-05-26 DIAGNOSIS — G40909 Epilepsy, unspecified, not intractable, without status epilepticus: Secondary | ICD-10-CM | POA: Diagnosis not present

## 2016-05-26 DIAGNOSIS — M6282 Rhabdomyolysis: Secondary | ICD-10-CM | POA: Diagnosis not present

## 2016-05-26 DIAGNOSIS — F411 Generalized anxiety disorder: Secondary | ICD-10-CM | POA: Diagnosis not present

## 2016-05-26 DIAGNOSIS — R279 Unspecified lack of coordination: Secondary | ICD-10-CM | POA: Diagnosis not present

## 2016-05-26 DIAGNOSIS — M6281 Muscle weakness (generalized): Secondary | ICD-10-CM | POA: Diagnosis not present

## 2016-05-26 DIAGNOSIS — G35 Multiple sclerosis: Secondary | ICD-10-CM | POA: Diagnosis not present

## 2016-05-27 DIAGNOSIS — R279 Unspecified lack of coordination: Secondary | ICD-10-CM | POA: Diagnosis not present

## 2016-05-27 DIAGNOSIS — M6282 Rhabdomyolysis: Secondary | ICD-10-CM | POA: Diagnosis not present

## 2016-05-27 DIAGNOSIS — G40909 Epilepsy, unspecified, not intractable, without status epilepticus: Secondary | ICD-10-CM | POA: Diagnosis not present

## 2016-05-27 DIAGNOSIS — F411 Generalized anxiety disorder: Secondary | ICD-10-CM | POA: Diagnosis not present

## 2016-05-27 DIAGNOSIS — G35 Multiple sclerosis: Secondary | ICD-10-CM | POA: Diagnosis not present

## 2016-05-27 DIAGNOSIS — M6281 Muscle weakness (generalized): Secondary | ICD-10-CM | POA: Diagnosis not present

## 2016-05-29 DIAGNOSIS — G35 Multiple sclerosis: Secondary | ICD-10-CM | POA: Diagnosis not present

## 2016-05-29 DIAGNOSIS — G40909 Epilepsy, unspecified, not intractable, without status epilepticus: Secondary | ICD-10-CM | POA: Diagnosis not present

## 2016-05-29 DIAGNOSIS — R279 Unspecified lack of coordination: Secondary | ICD-10-CM | POA: Diagnosis not present

## 2016-05-29 DIAGNOSIS — M6281 Muscle weakness (generalized): Secondary | ICD-10-CM | POA: Diagnosis not present

## 2016-05-29 DIAGNOSIS — M6282 Rhabdomyolysis: Secondary | ICD-10-CM | POA: Diagnosis not present

## 2016-05-29 DIAGNOSIS — F411 Generalized anxiety disorder: Secondary | ICD-10-CM | POA: Diagnosis not present

## 2016-05-30 DIAGNOSIS — M6281 Muscle weakness (generalized): Secondary | ICD-10-CM | POA: Diagnosis not present

## 2016-05-30 DIAGNOSIS — M6282 Rhabdomyolysis: Secondary | ICD-10-CM | POA: Diagnosis not present

## 2016-05-30 DIAGNOSIS — F411 Generalized anxiety disorder: Secondary | ICD-10-CM | POA: Diagnosis not present

## 2016-05-30 DIAGNOSIS — G35 Multiple sclerosis: Secondary | ICD-10-CM | POA: Diagnosis not present

## 2016-05-30 DIAGNOSIS — G40909 Epilepsy, unspecified, not intractable, without status epilepticus: Secondary | ICD-10-CM | POA: Diagnosis not present

## 2016-05-30 DIAGNOSIS — R279 Unspecified lack of coordination: Secondary | ICD-10-CM | POA: Diagnosis not present

## 2016-05-31 DIAGNOSIS — G35 Multiple sclerosis: Secondary | ICD-10-CM | POA: Diagnosis not present

## 2016-05-31 DIAGNOSIS — M6281 Muscle weakness (generalized): Secondary | ICD-10-CM | POA: Diagnosis not present

## 2016-05-31 DIAGNOSIS — R279 Unspecified lack of coordination: Secondary | ICD-10-CM | POA: Diagnosis not present

## 2016-05-31 DIAGNOSIS — G40909 Epilepsy, unspecified, not intractable, without status epilepticus: Secondary | ICD-10-CM | POA: Diagnosis not present

## 2016-05-31 DIAGNOSIS — M6282 Rhabdomyolysis: Secondary | ICD-10-CM | POA: Diagnosis not present

## 2016-05-31 DIAGNOSIS — F411 Generalized anxiety disorder: Secondary | ICD-10-CM | POA: Diagnosis not present

## 2016-06-01 DIAGNOSIS — M6282 Rhabdomyolysis: Secondary | ICD-10-CM | POA: Diagnosis not present

## 2016-06-01 DIAGNOSIS — M6281 Muscle weakness (generalized): Secondary | ICD-10-CM | POA: Diagnosis not present

## 2016-06-01 DIAGNOSIS — R279 Unspecified lack of coordination: Secondary | ICD-10-CM | POA: Diagnosis not present

## 2016-06-01 DIAGNOSIS — F411 Generalized anxiety disorder: Secondary | ICD-10-CM | POA: Diagnosis not present

## 2016-06-01 DIAGNOSIS — G40909 Epilepsy, unspecified, not intractable, without status epilepticus: Secondary | ICD-10-CM | POA: Diagnosis not present

## 2016-06-01 DIAGNOSIS — G35 Multiple sclerosis: Secondary | ICD-10-CM | POA: Diagnosis not present

## 2016-06-02 DIAGNOSIS — F411 Generalized anxiety disorder: Secondary | ICD-10-CM | POA: Diagnosis not present

## 2016-06-02 DIAGNOSIS — M6281 Muscle weakness (generalized): Secondary | ICD-10-CM | POA: Diagnosis not present

## 2016-06-02 DIAGNOSIS — G40909 Epilepsy, unspecified, not intractable, without status epilepticus: Secondary | ICD-10-CM | POA: Diagnosis not present

## 2016-06-02 DIAGNOSIS — G35 Multiple sclerosis: Secondary | ICD-10-CM | POA: Diagnosis not present

## 2016-06-02 DIAGNOSIS — M6282 Rhabdomyolysis: Secondary | ICD-10-CM | POA: Diagnosis not present

## 2016-06-02 DIAGNOSIS — R279 Unspecified lack of coordination: Secondary | ICD-10-CM | POA: Diagnosis not present

## 2016-06-03 DIAGNOSIS — R279 Unspecified lack of coordination: Secondary | ICD-10-CM | POA: Diagnosis not present

## 2016-06-03 DIAGNOSIS — G40909 Epilepsy, unspecified, not intractable, without status epilepticus: Secondary | ICD-10-CM | POA: Diagnosis not present

## 2016-06-03 DIAGNOSIS — M6281 Muscle weakness (generalized): Secondary | ICD-10-CM | POA: Diagnosis not present

## 2016-06-03 DIAGNOSIS — G35 Multiple sclerosis: Secondary | ICD-10-CM | POA: Diagnosis not present

## 2016-06-03 DIAGNOSIS — F411 Generalized anxiety disorder: Secondary | ICD-10-CM | POA: Diagnosis not present

## 2016-06-03 DIAGNOSIS — M6282 Rhabdomyolysis: Secondary | ICD-10-CM | POA: Diagnosis not present

## 2016-06-05 DIAGNOSIS — R41841 Cognitive communication deficit: Secondary | ICD-10-CM | POA: Diagnosis not present

## 2016-06-05 DIAGNOSIS — R279 Unspecified lack of coordination: Secondary | ICD-10-CM | POA: Diagnosis not present

## 2016-06-05 DIAGNOSIS — R278 Other lack of coordination: Secondary | ICD-10-CM | POA: Diagnosis not present

## 2016-06-05 DIAGNOSIS — M6281 Muscle weakness (generalized): Secondary | ICD-10-CM | POA: Diagnosis not present

## 2016-06-05 DIAGNOSIS — R262 Difficulty in walking, not elsewhere classified: Secondary | ICD-10-CM | POA: Diagnosis not present

## 2016-06-05 DIAGNOSIS — M6282 Rhabdomyolysis: Secondary | ICD-10-CM | POA: Diagnosis not present

## 2016-06-05 DIAGNOSIS — G35 Multiple sclerosis: Secondary | ICD-10-CM | POA: Diagnosis not present

## 2016-06-05 DIAGNOSIS — R1312 Dysphagia, oropharyngeal phase: Secondary | ICD-10-CM | POA: Diagnosis not present

## 2016-06-05 DIAGNOSIS — G40909 Epilepsy, unspecified, not intractable, without status epilepticus: Secondary | ICD-10-CM | POA: Diagnosis not present

## 2016-06-05 DIAGNOSIS — F411 Generalized anxiety disorder: Secondary | ICD-10-CM | POA: Diagnosis not present

## 2016-06-06 DIAGNOSIS — M6281 Muscle weakness (generalized): Secondary | ICD-10-CM | POA: Diagnosis not present

## 2016-06-06 DIAGNOSIS — G35 Multiple sclerosis: Secondary | ICD-10-CM | POA: Diagnosis not present

## 2016-06-06 DIAGNOSIS — F411 Generalized anxiety disorder: Secondary | ICD-10-CM | POA: Diagnosis not present

## 2016-06-06 DIAGNOSIS — M6282 Rhabdomyolysis: Secondary | ICD-10-CM | POA: Diagnosis not present

## 2016-06-06 DIAGNOSIS — R279 Unspecified lack of coordination: Secondary | ICD-10-CM | POA: Diagnosis not present

## 2016-06-06 DIAGNOSIS — G40909 Epilepsy, unspecified, not intractable, without status epilepticus: Secondary | ICD-10-CM | POA: Diagnosis not present

## 2016-06-07 DIAGNOSIS — G35 Multiple sclerosis: Secondary | ICD-10-CM | POA: Diagnosis not present

## 2016-06-07 DIAGNOSIS — F411 Generalized anxiety disorder: Secondary | ICD-10-CM | POA: Diagnosis not present

## 2016-06-07 DIAGNOSIS — M6282 Rhabdomyolysis: Secondary | ICD-10-CM | POA: Diagnosis not present

## 2016-06-07 DIAGNOSIS — G40909 Epilepsy, unspecified, not intractable, without status epilepticus: Secondary | ICD-10-CM | POA: Diagnosis not present

## 2016-06-07 DIAGNOSIS — M6281 Muscle weakness (generalized): Secondary | ICD-10-CM | POA: Diagnosis not present

## 2016-06-07 DIAGNOSIS — R279 Unspecified lack of coordination: Secondary | ICD-10-CM | POA: Diagnosis not present

## 2016-06-08 DIAGNOSIS — G40909 Epilepsy, unspecified, not intractable, without status epilepticus: Secondary | ICD-10-CM | POA: Diagnosis not present

## 2016-06-08 DIAGNOSIS — R279 Unspecified lack of coordination: Secondary | ICD-10-CM | POA: Diagnosis not present

## 2016-06-08 DIAGNOSIS — F411 Generalized anxiety disorder: Secondary | ICD-10-CM | POA: Diagnosis not present

## 2016-06-08 DIAGNOSIS — M6281 Muscle weakness (generalized): Secondary | ICD-10-CM | POA: Diagnosis not present

## 2016-06-08 DIAGNOSIS — M6282 Rhabdomyolysis: Secondary | ICD-10-CM | POA: Diagnosis not present

## 2016-06-08 DIAGNOSIS — G35 Multiple sclerosis: Secondary | ICD-10-CM | POA: Diagnosis not present

## 2016-06-09 DIAGNOSIS — G35 Multiple sclerosis: Secondary | ICD-10-CM | POA: Diagnosis not present

## 2016-06-09 DIAGNOSIS — M6282 Rhabdomyolysis: Secondary | ICD-10-CM | POA: Diagnosis not present

## 2016-06-09 DIAGNOSIS — M6281 Muscle weakness (generalized): Secondary | ICD-10-CM | POA: Diagnosis not present

## 2016-06-09 DIAGNOSIS — G40909 Epilepsy, unspecified, not intractable, without status epilepticus: Secondary | ICD-10-CM | POA: Diagnosis not present

## 2016-06-09 DIAGNOSIS — F411 Generalized anxiety disorder: Secondary | ICD-10-CM | POA: Diagnosis not present

## 2016-06-09 DIAGNOSIS — R279 Unspecified lack of coordination: Secondary | ICD-10-CM | POA: Diagnosis not present

## 2016-06-10 DIAGNOSIS — G35 Multiple sclerosis: Secondary | ICD-10-CM | POA: Diagnosis not present

## 2016-06-10 DIAGNOSIS — R279 Unspecified lack of coordination: Secondary | ICD-10-CM | POA: Diagnosis not present

## 2016-06-10 DIAGNOSIS — M6282 Rhabdomyolysis: Secondary | ICD-10-CM | POA: Diagnosis not present

## 2016-06-10 DIAGNOSIS — G40909 Epilepsy, unspecified, not intractable, without status epilepticus: Secondary | ICD-10-CM | POA: Diagnosis not present

## 2016-06-10 DIAGNOSIS — M6281 Muscle weakness (generalized): Secondary | ICD-10-CM | POA: Diagnosis not present

## 2016-06-10 DIAGNOSIS — F411 Generalized anxiety disorder: Secondary | ICD-10-CM | POA: Diagnosis not present

## 2016-06-12 DIAGNOSIS — M6281 Muscle weakness (generalized): Secondary | ICD-10-CM | POA: Diagnosis not present

## 2016-06-12 DIAGNOSIS — F411 Generalized anxiety disorder: Secondary | ICD-10-CM | POA: Diagnosis not present

## 2016-06-12 DIAGNOSIS — G35 Multiple sclerosis: Secondary | ICD-10-CM | POA: Diagnosis not present

## 2016-06-12 DIAGNOSIS — M6282 Rhabdomyolysis: Secondary | ICD-10-CM | POA: Diagnosis not present

## 2016-06-12 DIAGNOSIS — R279 Unspecified lack of coordination: Secondary | ICD-10-CM | POA: Diagnosis not present

## 2016-06-12 DIAGNOSIS — G40909 Epilepsy, unspecified, not intractable, without status epilepticus: Secondary | ICD-10-CM | POA: Diagnosis not present

## 2016-06-13 DIAGNOSIS — M6282 Rhabdomyolysis: Secondary | ICD-10-CM | POA: Diagnosis not present

## 2016-06-13 DIAGNOSIS — M6281 Muscle weakness (generalized): Secondary | ICD-10-CM | POA: Diagnosis not present

## 2016-06-13 DIAGNOSIS — G40909 Epilepsy, unspecified, not intractable, without status epilepticus: Secondary | ICD-10-CM | POA: Diagnosis not present

## 2016-06-13 DIAGNOSIS — F411 Generalized anxiety disorder: Secondary | ICD-10-CM | POA: Diagnosis not present

## 2016-06-13 DIAGNOSIS — R279 Unspecified lack of coordination: Secondary | ICD-10-CM | POA: Diagnosis not present

## 2016-06-13 DIAGNOSIS — G35 Multiple sclerosis: Secondary | ICD-10-CM | POA: Diagnosis not present

## 2016-06-14 DIAGNOSIS — M6282 Rhabdomyolysis: Secondary | ICD-10-CM | POA: Diagnosis not present

## 2016-06-14 DIAGNOSIS — R279 Unspecified lack of coordination: Secondary | ICD-10-CM | POA: Diagnosis not present

## 2016-06-14 DIAGNOSIS — G35 Multiple sclerosis: Secondary | ICD-10-CM | POA: Diagnosis not present

## 2016-06-14 DIAGNOSIS — M6281 Muscle weakness (generalized): Secondary | ICD-10-CM | POA: Diagnosis not present

## 2016-06-14 DIAGNOSIS — G40909 Epilepsy, unspecified, not intractable, without status epilepticus: Secondary | ICD-10-CM | POA: Diagnosis not present

## 2016-06-14 DIAGNOSIS — F411 Generalized anxiety disorder: Secondary | ICD-10-CM | POA: Diagnosis not present

## 2016-06-15 DIAGNOSIS — F411 Generalized anxiety disorder: Secondary | ICD-10-CM | POA: Diagnosis not present

## 2016-06-15 DIAGNOSIS — R279 Unspecified lack of coordination: Secondary | ICD-10-CM | POA: Diagnosis not present

## 2016-06-15 DIAGNOSIS — G40909 Epilepsy, unspecified, not intractable, without status epilepticus: Secondary | ICD-10-CM | POA: Diagnosis not present

## 2016-06-15 DIAGNOSIS — G9349 Other encephalopathy: Secondary | ICD-10-CM | POA: Diagnosis not present

## 2016-06-15 DIAGNOSIS — M6281 Muscle weakness (generalized): Secondary | ICD-10-CM | POA: Diagnosis not present

## 2016-06-15 DIAGNOSIS — I638 Other cerebral infarction: Secondary | ICD-10-CM | POA: Diagnosis not present

## 2016-06-15 DIAGNOSIS — M6282 Rhabdomyolysis: Secondary | ICD-10-CM | POA: Diagnosis not present

## 2016-06-15 DIAGNOSIS — G35 Multiple sclerosis: Secondary | ICD-10-CM | POA: Diagnosis not present

## 2016-06-16 DIAGNOSIS — G35 Multiple sclerosis: Secondary | ICD-10-CM | POA: Diagnosis not present

## 2016-06-16 DIAGNOSIS — R279 Unspecified lack of coordination: Secondary | ICD-10-CM | POA: Diagnosis not present

## 2016-06-16 DIAGNOSIS — M6281 Muscle weakness (generalized): Secondary | ICD-10-CM | POA: Diagnosis not present

## 2016-06-16 DIAGNOSIS — M6282 Rhabdomyolysis: Secondary | ICD-10-CM | POA: Diagnosis not present

## 2016-06-16 DIAGNOSIS — G40909 Epilepsy, unspecified, not intractable, without status epilepticus: Secondary | ICD-10-CM | POA: Diagnosis not present

## 2016-06-16 DIAGNOSIS — F411 Generalized anxiety disorder: Secondary | ICD-10-CM | POA: Diagnosis not present

## 2016-06-17 DIAGNOSIS — R279 Unspecified lack of coordination: Secondary | ICD-10-CM | POA: Diagnosis not present

## 2016-06-17 DIAGNOSIS — G35 Multiple sclerosis: Secondary | ICD-10-CM | POA: Diagnosis not present

## 2016-06-17 DIAGNOSIS — M6282 Rhabdomyolysis: Secondary | ICD-10-CM | POA: Diagnosis not present

## 2016-06-17 DIAGNOSIS — F411 Generalized anxiety disorder: Secondary | ICD-10-CM | POA: Diagnosis not present

## 2016-06-17 DIAGNOSIS — M6281 Muscle weakness (generalized): Secondary | ICD-10-CM | POA: Diagnosis not present

## 2016-06-17 DIAGNOSIS — G40909 Epilepsy, unspecified, not intractable, without status epilepticus: Secondary | ICD-10-CM | POA: Diagnosis not present

## 2016-06-19 DIAGNOSIS — G40909 Epilepsy, unspecified, not intractable, without status epilepticus: Secondary | ICD-10-CM | POA: Diagnosis not present

## 2016-06-19 DIAGNOSIS — G35 Multiple sclerosis: Secondary | ICD-10-CM | POA: Diagnosis not present

## 2016-06-19 DIAGNOSIS — M6281 Muscle weakness (generalized): Secondary | ICD-10-CM | POA: Diagnosis not present

## 2016-06-19 DIAGNOSIS — R279 Unspecified lack of coordination: Secondary | ICD-10-CM | POA: Diagnosis not present

## 2016-06-19 DIAGNOSIS — M6282 Rhabdomyolysis: Secondary | ICD-10-CM | POA: Diagnosis not present

## 2016-06-19 DIAGNOSIS — F411 Generalized anxiety disorder: Secondary | ICD-10-CM | POA: Diagnosis not present

## 2016-06-20 DIAGNOSIS — R279 Unspecified lack of coordination: Secondary | ICD-10-CM | POA: Diagnosis not present

## 2016-06-20 DIAGNOSIS — G40909 Epilepsy, unspecified, not intractable, without status epilepticus: Secondary | ICD-10-CM | POA: Diagnosis not present

## 2016-06-20 DIAGNOSIS — M6282 Rhabdomyolysis: Secondary | ICD-10-CM | POA: Diagnosis not present

## 2016-06-20 DIAGNOSIS — F411 Generalized anxiety disorder: Secondary | ICD-10-CM | POA: Diagnosis not present

## 2016-06-20 DIAGNOSIS — M6281 Muscle weakness (generalized): Secondary | ICD-10-CM | POA: Diagnosis not present

## 2016-06-20 DIAGNOSIS — G35 Multiple sclerosis: Secondary | ICD-10-CM | POA: Diagnosis not present

## 2016-06-21 DIAGNOSIS — G35 Multiple sclerosis: Secondary | ICD-10-CM | POA: Diagnosis not present

## 2016-06-21 DIAGNOSIS — M6282 Rhabdomyolysis: Secondary | ICD-10-CM | POA: Diagnosis not present

## 2016-06-21 DIAGNOSIS — F411 Generalized anxiety disorder: Secondary | ICD-10-CM | POA: Diagnosis not present

## 2016-06-21 DIAGNOSIS — R279 Unspecified lack of coordination: Secondary | ICD-10-CM | POA: Diagnosis not present

## 2016-06-21 DIAGNOSIS — M6281 Muscle weakness (generalized): Secondary | ICD-10-CM | POA: Diagnosis not present

## 2016-06-21 DIAGNOSIS — G40909 Epilepsy, unspecified, not intractable, without status epilepticus: Secondary | ICD-10-CM | POA: Diagnosis not present

## 2016-06-22 DIAGNOSIS — G40909 Epilepsy, unspecified, not intractable, without status epilepticus: Secondary | ICD-10-CM | POA: Diagnosis not present

## 2016-06-22 DIAGNOSIS — F411 Generalized anxiety disorder: Secondary | ICD-10-CM | POA: Diagnosis not present

## 2016-06-22 DIAGNOSIS — R279 Unspecified lack of coordination: Secondary | ICD-10-CM | POA: Diagnosis not present

## 2016-06-22 DIAGNOSIS — M6281 Muscle weakness (generalized): Secondary | ICD-10-CM | POA: Diagnosis not present

## 2016-06-22 DIAGNOSIS — G35 Multiple sclerosis: Secondary | ICD-10-CM | POA: Diagnosis not present

## 2016-06-22 DIAGNOSIS — M6282 Rhabdomyolysis: Secondary | ICD-10-CM | POA: Diagnosis not present

## 2016-06-23 DIAGNOSIS — M6281 Muscle weakness (generalized): Secondary | ICD-10-CM | POA: Diagnosis not present

## 2016-06-23 DIAGNOSIS — F331 Major depressive disorder, recurrent, moderate: Secondary | ICD-10-CM | POA: Diagnosis not present

## 2016-06-23 DIAGNOSIS — R279 Unspecified lack of coordination: Secondary | ICD-10-CM | POA: Diagnosis not present

## 2016-06-23 DIAGNOSIS — F411 Generalized anxiety disorder: Secondary | ICD-10-CM | POA: Diagnosis not present

## 2016-06-23 DIAGNOSIS — G35 Multiple sclerosis: Secondary | ICD-10-CM | POA: Diagnosis not present

## 2016-06-23 DIAGNOSIS — M6282 Rhabdomyolysis: Secondary | ICD-10-CM | POA: Diagnosis not present

## 2016-06-23 DIAGNOSIS — G40909 Epilepsy, unspecified, not intractable, without status epilepticus: Secondary | ICD-10-CM | POA: Diagnosis not present

## 2016-06-24 DIAGNOSIS — M6282 Rhabdomyolysis: Secondary | ICD-10-CM | POA: Diagnosis not present

## 2016-06-24 DIAGNOSIS — R279 Unspecified lack of coordination: Secondary | ICD-10-CM | POA: Diagnosis not present

## 2016-06-24 DIAGNOSIS — F411 Generalized anxiety disorder: Secondary | ICD-10-CM | POA: Diagnosis not present

## 2016-06-24 DIAGNOSIS — M6281 Muscle weakness (generalized): Secondary | ICD-10-CM | POA: Diagnosis not present

## 2016-06-24 DIAGNOSIS — G40909 Epilepsy, unspecified, not intractable, without status epilepticus: Secondary | ICD-10-CM | POA: Diagnosis not present

## 2016-06-24 DIAGNOSIS — G35 Multiple sclerosis: Secondary | ICD-10-CM | POA: Diagnosis not present

## 2016-06-25 DIAGNOSIS — F331 Major depressive disorder, recurrent, moderate: Secondary | ICD-10-CM | POA: Diagnosis not present

## 2016-06-26 DIAGNOSIS — M6282 Rhabdomyolysis: Secondary | ICD-10-CM | POA: Diagnosis not present

## 2016-06-26 DIAGNOSIS — G40909 Epilepsy, unspecified, not intractable, without status epilepticus: Secondary | ICD-10-CM | POA: Diagnosis not present

## 2016-06-26 DIAGNOSIS — R279 Unspecified lack of coordination: Secondary | ICD-10-CM | POA: Diagnosis not present

## 2016-06-26 DIAGNOSIS — G35 Multiple sclerosis: Secondary | ICD-10-CM | POA: Diagnosis not present

## 2016-06-26 DIAGNOSIS — F411 Generalized anxiety disorder: Secondary | ICD-10-CM | POA: Diagnosis not present

## 2016-06-26 DIAGNOSIS — M6281 Muscle weakness (generalized): Secondary | ICD-10-CM | POA: Diagnosis not present

## 2016-06-27 DIAGNOSIS — G35 Multiple sclerosis: Secondary | ICD-10-CM | POA: Diagnosis not present

## 2016-06-27 DIAGNOSIS — R279 Unspecified lack of coordination: Secondary | ICD-10-CM | POA: Diagnosis not present

## 2016-06-27 DIAGNOSIS — F411 Generalized anxiety disorder: Secondary | ICD-10-CM | POA: Diagnosis not present

## 2016-06-27 DIAGNOSIS — M6282 Rhabdomyolysis: Secondary | ICD-10-CM | POA: Diagnosis not present

## 2016-06-27 DIAGNOSIS — G40909 Epilepsy, unspecified, not intractable, without status epilepticus: Secondary | ICD-10-CM | POA: Diagnosis not present

## 2016-06-27 DIAGNOSIS — M6281 Muscle weakness (generalized): Secondary | ICD-10-CM | POA: Diagnosis not present

## 2016-06-28 DIAGNOSIS — R279 Unspecified lack of coordination: Secondary | ICD-10-CM | POA: Diagnosis not present

## 2016-06-28 DIAGNOSIS — F411 Generalized anxiety disorder: Secondary | ICD-10-CM | POA: Diagnosis not present

## 2016-06-28 DIAGNOSIS — G35 Multiple sclerosis: Secondary | ICD-10-CM | POA: Diagnosis not present

## 2016-06-28 DIAGNOSIS — G40909 Epilepsy, unspecified, not intractable, without status epilepticus: Secondary | ICD-10-CM | POA: Diagnosis not present

## 2016-06-28 DIAGNOSIS — M6282 Rhabdomyolysis: Secondary | ICD-10-CM | POA: Diagnosis not present

## 2016-06-28 DIAGNOSIS — M6281 Muscle weakness (generalized): Secondary | ICD-10-CM | POA: Diagnosis not present

## 2016-06-29 DIAGNOSIS — R279 Unspecified lack of coordination: Secondary | ICD-10-CM | POA: Diagnosis not present

## 2016-06-29 DIAGNOSIS — G35 Multiple sclerosis: Secondary | ICD-10-CM | POA: Diagnosis not present

## 2016-06-29 DIAGNOSIS — M6281 Muscle weakness (generalized): Secondary | ICD-10-CM | POA: Diagnosis not present

## 2016-06-29 DIAGNOSIS — M6282 Rhabdomyolysis: Secondary | ICD-10-CM | POA: Diagnosis not present

## 2016-06-29 DIAGNOSIS — F411 Generalized anxiety disorder: Secondary | ICD-10-CM | POA: Diagnosis not present

## 2016-06-29 DIAGNOSIS — G40909 Epilepsy, unspecified, not intractable, without status epilepticus: Secondary | ICD-10-CM | POA: Diagnosis not present

## 2016-06-30 DIAGNOSIS — G35 Multiple sclerosis: Secondary | ICD-10-CM | POA: Diagnosis not present

## 2016-06-30 DIAGNOSIS — R279 Unspecified lack of coordination: Secondary | ICD-10-CM | POA: Diagnosis not present

## 2016-06-30 DIAGNOSIS — G40909 Epilepsy, unspecified, not intractable, without status epilepticus: Secondary | ICD-10-CM | POA: Diagnosis not present

## 2016-06-30 DIAGNOSIS — M6281 Muscle weakness (generalized): Secondary | ICD-10-CM | POA: Diagnosis not present

## 2016-06-30 DIAGNOSIS — M6282 Rhabdomyolysis: Secondary | ICD-10-CM | POA: Diagnosis not present

## 2016-06-30 DIAGNOSIS — F411 Generalized anxiety disorder: Secondary | ICD-10-CM | POA: Diagnosis not present

## 2016-07-02 DIAGNOSIS — F331 Major depressive disorder, recurrent, moderate: Secondary | ICD-10-CM | POA: Diagnosis not present

## 2016-07-04 DIAGNOSIS — M6282 Rhabdomyolysis: Secondary | ICD-10-CM | POA: Diagnosis not present

## 2016-07-04 DIAGNOSIS — F411 Generalized anxiety disorder: Secondary | ICD-10-CM | POA: Diagnosis not present

## 2016-07-04 DIAGNOSIS — G40909 Epilepsy, unspecified, not intractable, without status epilepticus: Secondary | ICD-10-CM | POA: Diagnosis not present

## 2016-07-04 DIAGNOSIS — R279 Unspecified lack of coordination: Secondary | ICD-10-CM | POA: Diagnosis not present

## 2016-07-04 DIAGNOSIS — M6281 Muscle weakness (generalized): Secondary | ICD-10-CM | POA: Diagnosis not present

## 2016-07-04 DIAGNOSIS — G35 Multiple sclerosis: Secondary | ICD-10-CM | POA: Diagnosis not present

## 2016-07-09 DIAGNOSIS — F331 Major depressive disorder, recurrent, moderate: Secondary | ICD-10-CM | POA: Diagnosis not present

## 2016-07-12 DIAGNOSIS — R260 Ataxic gait: Secondary | ICD-10-CM | POA: Diagnosis not present

## 2016-07-12 DIAGNOSIS — F5109 Other insomnia not due to a substance or known physiological condition: Secondary | ICD-10-CM | POA: Diagnosis not present

## 2016-07-12 DIAGNOSIS — F329 Major depressive disorder, single episode, unspecified: Secondary | ICD-10-CM | POA: Diagnosis not present

## 2016-07-12 DIAGNOSIS — F0391 Unspecified dementia with behavioral disturbance: Secondary | ICD-10-CM | POA: Diagnosis not present

## 2016-07-12 DIAGNOSIS — Z79899 Other long term (current) drug therapy: Secondary | ICD-10-CM | POA: Diagnosis not present

## 2016-07-12 DIAGNOSIS — I6990 Unspecified sequelae of unspecified cerebrovascular disease: Secondary | ICD-10-CM | POA: Diagnosis not present

## 2016-07-12 DIAGNOSIS — G4459 Other complicated headache syndrome: Secondary | ICD-10-CM | POA: Diagnosis not present

## 2016-07-12 DIAGNOSIS — G40419 Other generalized epilepsy and epileptic syndromes, intractable, without status epilepticus: Secondary | ICD-10-CM | POA: Diagnosis not present

## 2016-07-12 DIAGNOSIS — G35 Multiple sclerosis: Secondary | ICD-10-CM | POA: Diagnosis not present

## 2016-07-14 DIAGNOSIS — I10 Essential (primary) hypertension: Secondary | ICD-10-CM | POA: Diagnosis not present

## 2016-07-14 DIAGNOSIS — E119 Type 2 diabetes mellitus without complications: Secondary | ICD-10-CM | POA: Diagnosis not present

## 2016-07-15 DIAGNOSIS — F331 Major depressive disorder, recurrent, moderate: Secondary | ICD-10-CM | POA: Diagnosis not present

## 2016-07-17 DIAGNOSIS — Z7984 Long term (current) use of oral hypoglycemic drugs: Secondary | ICD-10-CM | POA: Diagnosis not present

## 2016-07-17 DIAGNOSIS — H40023 Open angle with borderline findings, high risk, bilateral: Secondary | ICD-10-CM | POA: Diagnosis not present

## 2016-07-17 DIAGNOSIS — H2513 Age-related nuclear cataract, bilateral: Secondary | ICD-10-CM | POA: Diagnosis not present

## 2016-07-17 DIAGNOSIS — E113293 Type 2 diabetes mellitus with mild nonproliferative diabetic retinopathy without macular edema, bilateral: Secondary | ICD-10-CM | POA: Diagnosis not present

## 2016-07-22 DIAGNOSIS — F331 Major depressive disorder, recurrent, moderate: Secondary | ICD-10-CM | POA: Diagnosis not present

## 2016-07-26 DIAGNOSIS — F331 Major depressive disorder, recurrent, moderate: Secondary | ICD-10-CM | POA: Diagnosis not present

## 2016-07-30 DIAGNOSIS — F331 Major depressive disorder, recurrent, moderate: Secondary | ICD-10-CM | POA: Diagnosis not present

## 2016-07-31 DIAGNOSIS — I638 Other cerebral infarction: Secondary | ICD-10-CM | POA: Diagnosis not present

## 2016-07-31 DIAGNOSIS — G9349 Other encephalopathy: Secondary | ICD-10-CM | POA: Diagnosis not present

## 2016-07-31 DIAGNOSIS — G35 Multiple sclerosis: Secondary | ICD-10-CM | POA: Diagnosis not present

## 2016-08-06 DIAGNOSIS — F331 Major depressive disorder, recurrent, moderate: Secondary | ICD-10-CM | POA: Diagnosis not present

## 2016-08-12 DIAGNOSIS — F331 Major depressive disorder, recurrent, moderate: Secondary | ICD-10-CM | POA: Diagnosis not present

## 2016-08-14 DIAGNOSIS — F29 Unspecified psychosis not due to a substance or known physiological condition: Secondary | ICD-10-CM | POA: Diagnosis not present

## 2016-08-14 DIAGNOSIS — F329 Major depressive disorder, single episode, unspecified: Secondary | ICD-10-CM | POA: Diagnosis not present

## 2016-08-14 DIAGNOSIS — F419 Anxiety disorder, unspecified: Secondary | ICD-10-CM | POA: Diagnosis not present

## 2016-08-14 DIAGNOSIS — F319 Bipolar disorder, unspecified: Secondary | ICD-10-CM | POA: Diagnosis not present

## 2016-08-15 DIAGNOSIS — R5383 Other fatigue: Secondary | ICD-10-CM | POA: Diagnosis not present

## 2016-08-15 DIAGNOSIS — R630 Anorexia: Secondary | ICD-10-CM | POA: Diagnosis not present

## 2016-08-19 DIAGNOSIS — F331 Major depressive disorder, recurrent, moderate: Secondary | ICD-10-CM | POA: Diagnosis not present

## 2016-08-26 DIAGNOSIS — F331 Major depressive disorder, recurrent, moderate: Secondary | ICD-10-CM | POA: Diagnosis not present

## 2016-08-28 DIAGNOSIS — G35 Multiple sclerosis: Secondary | ICD-10-CM | POA: Diagnosis not present

## 2016-08-28 DIAGNOSIS — I509 Heart failure, unspecified: Secondary | ICD-10-CM | POA: Diagnosis not present

## 2016-08-28 DIAGNOSIS — N390 Urinary tract infection, site not specified: Secondary | ICD-10-CM | POA: Diagnosis not present

## 2016-08-28 DIAGNOSIS — E860 Dehydration: Secondary | ICD-10-CM | POA: Diagnosis not present

## 2016-08-28 DIAGNOSIS — I110 Hypertensive heart disease with heart failure: Secondary | ICD-10-CM | POA: Diagnosis not present

## 2016-08-28 DIAGNOSIS — I959 Hypotension, unspecified: Secondary | ICD-10-CM | POA: Diagnosis not present

## 2016-08-28 DIAGNOSIS — I5022 Chronic systolic (congestive) heart failure: Secondary | ICD-10-CM | POA: Diagnosis not present

## 2016-08-30 DIAGNOSIS — F29 Unspecified psychosis not due to a substance or known physiological condition: Secondary | ICD-10-CM | POA: Diagnosis not present

## 2016-08-30 DIAGNOSIS — F329 Major depressive disorder, single episode, unspecified: Secondary | ICD-10-CM | POA: Diagnosis not present

## 2016-08-30 DIAGNOSIS — I10 Essential (primary) hypertension: Secondary | ICD-10-CM | POA: Diagnosis not present

## 2016-08-30 DIAGNOSIS — F419 Anxiety disorder, unspecified: Secondary | ICD-10-CM | POA: Diagnosis not present

## 2016-08-30 DIAGNOSIS — F319 Bipolar disorder, unspecified: Secondary | ICD-10-CM | POA: Diagnosis not present

## 2016-09-02 DIAGNOSIS — F331 Major depressive disorder, recurrent, moderate: Secondary | ICD-10-CM | POA: Diagnosis not present

## 2016-09-09 DIAGNOSIS — F331 Major depressive disorder, recurrent, moderate: Secondary | ICD-10-CM | POA: Diagnosis not present

## 2016-09-13 DIAGNOSIS — F329 Major depressive disorder, single episode, unspecified: Secondary | ICD-10-CM | POA: Diagnosis not present

## 2016-09-13 DIAGNOSIS — F29 Unspecified psychosis not due to a substance or known physiological condition: Secondary | ICD-10-CM | POA: Diagnosis not present

## 2016-09-13 DIAGNOSIS — F319 Bipolar disorder, unspecified: Secondary | ICD-10-CM | POA: Diagnosis not present

## 2016-09-13 DIAGNOSIS — F419 Anxiety disorder, unspecified: Secondary | ICD-10-CM | POA: Diagnosis not present

## 2016-09-25 DIAGNOSIS — F319 Bipolar disorder, unspecified: Secondary | ICD-10-CM | POA: Diagnosis not present

## 2016-09-25 DIAGNOSIS — G35 Multiple sclerosis: Secondary | ICD-10-CM | POA: Diagnosis not present

## 2016-09-25 DIAGNOSIS — I6300 Cerebral infarction due to thrombosis of unspecified precerebral artery: Secondary | ICD-10-CM | POA: Diagnosis not present

## 2016-09-25 DIAGNOSIS — E1165 Type 2 diabetes mellitus with hyperglycemia: Secondary | ICD-10-CM | POA: Diagnosis not present

## 2016-09-28 DIAGNOSIS — G40909 Epilepsy, unspecified, not intractable, without status epilepticus: Secondary | ICD-10-CM | POA: Diagnosis not present

## 2016-09-28 DIAGNOSIS — G35 Multiple sclerosis: Secondary | ICD-10-CM | POA: Diagnosis not present

## 2016-09-28 DIAGNOSIS — M6282 Rhabdomyolysis: Secondary | ICD-10-CM | POA: Diagnosis not present

## 2016-09-28 DIAGNOSIS — I6300 Cerebral infarction due to thrombosis of unspecified precerebral artery: Secondary | ICD-10-CM | POA: Diagnosis not present

## 2016-09-29 DIAGNOSIS — E1165 Type 2 diabetes mellitus with hyperglycemia: Secondary | ICD-10-CM | POA: Diagnosis not present

## 2016-09-29 DIAGNOSIS — B351 Tinea unguium: Secondary | ICD-10-CM | POA: Diagnosis not present

## 2016-09-29 DIAGNOSIS — L84 Corns and callosities: Secondary | ICD-10-CM | POA: Diagnosis not present

## 2016-09-29 DIAGNOSIS — M79671 Pain in right foot: Secondary | ICD-10-CM | POA: Diagnosis not present

## 2016-09-29 DIAGNOSIS — M2042 Other hammer toe(s) (acquired), left foot: Secondary | ICD-10-CM | POA: Diagnosis not present

## 2016-10-04 DIAGNOSIS — G4459 Other complicated headache syndrome: Secondary | ICD-10-CM | POA: Diagnosis not present

## 2016-10-04 DIAGNOSIS — I6990 Unspecified sequelae of unspecified cerebrovascular disease: Secondary | ICD-10-CM | POA: Diagnosis not present

## 2016-10-04 DIAGNOSIS — G40419 Other generalized epilepsy and epileptic syndromes, intractable, without status epilepticus: Secondary | ICD-10-CM | POA: Diagnosis not present

## 2016-10-04 DIAGNOSIS — R260 Ataxic gait: Secondary | ICD-10-CM | POA: Diagnosis not present

## 2016-10-04 DIAGNOSIS — Z79899 Other long term (current) drug therapy: Secondary | ICD-10-CM | POA: Diagnosis not present

## 2016-10-04 DIAGNOSIS — G35 Multiple sclerosis: Secondary | ICD-10-CM | POA: Diagnosis not present

## 2016-10-04 DIAGNOSIS — F0391 Unspecified dementia with behavioral disturbance: Secondary | ICD-10-CM | POA: Diagnosis not present

## 2016-10-10 DIAGNOSIS — E119 Type 2 diabetes mellitus without complications: Secondary | ICD-10-CM | POA: Diagnosis not present

## 2016-10-10 DIAGNOSIS — Z79899 Other long term (current) drug therapy: Secondary | ICD-10-CM | POA: Diagnosis not present

## 2016-10-26 DIAGNOSIS — E1165 Type 2 diabetes mellitus with hyperglycemia: Secondary | ICD-10-CM | POA: Diagnosis not present

## 2016-10-26 DIAGNOSIS — I6300 Cerebral infarction due to thrombosis of unspecified precerebral artery: Secondary | ICD-10-CM | POA: Diagnosis not present

## 2016-10-26 DIAGNOSIS — F411 Generalized anxiety disorder: Secondary | ICD-10-CM | POA: Diagnosis not present

## 2016-10-26 DIAGNOSIS — G40909 Epilepsy, unspecified, not intractable, without status epilepticus: Secondary | ICD-10-CM | POA: Diagnosis not present

## 2016-10-31 DIAGNOSIS — M6282 Rhabdomyolysis: Secondary | ICD-10-CM | POA: Diagnosis not present

## 2016-10-31 DIAGNOSIS — G40909 Epilepsy, unspecified, not intractable, without status epilepticus: Secondary | ICD-10-CM | POA: Diagnosis not present

## 2016-10-31 DIAGNOSIS — G35 Multiple sclerosis: Secondary | ICD-10-CM | POA: Diagnosis not present

## 2016-10-31 DIAGNOSIS — M6281 Muscle weakness (generalized): Secondary | ICD-10-CM | POA: Diagnosis not present

## 2016-10-31 DIAGNOSIS — F411 Generalized anxiety disorder: Secondary | ICD-10-CM | POA: Diagnosis not present

## 2016-10-31 DIAGNOSIS — R279 Unspecified lack of coordination: Secondary | ICD-10-CM | POA: Diagnosis not present

## 2016-10-31 DIAGNOSIS — R278 Other lack of coordination: Secondary | ICD-10-CM | POA: Diagnosis not present

## 2016-11-01 DIAGNOSIS — R279 Unspecified lack of coordination: Secondary | ICD-10-CM | POA: Diagnosis not present

## 2016-11-01 DIAGNOSIS — M6281 Muscle weakness (generalized): Secondary | ICD-10-CM | POA: Diagnosis not present

## 2016-11-01 DIAGNOSIS — G40909 Epilepsy, unspecified, not intractable, without status epilepticus: Secondary | ICD-10-CM | POA: Diagnosis not present

## 2016-11-01 DIAGNOSIS — M6282 Rhabdomyolysis: Secondary | ICD-10-CM | POA: Diagnosis not present

## 2016-11-01 DIAGNOSIS — G35 Multiple sclerosis: Secondary | ICD-10-CM | POA: Diagnosis not present

## 2016-11-01 DIAGNOSIS — F411 Generalized anxiety disorder: Secondary | ICD-10-CM | POA: Diagnosis not present

## 2016-11-02 DIAGNOSIS — M6281 Muscle weakness (generalized): Secondary | ICD-10-CM | POA: Diagnosis not present

## 2016-11-02 DIAGNOSIS — R279 Unspecified lack of coordination: Secondary | ICD-10-CM | POA: Diagnosis not present

## 2016-11-02 DIAGNOSIS — G40909 Epilepsy, unspecified, not intractable, without status epilepticus: Secondary | ICD-10-CM | POA: Diagnosis not present

## 2016-11-02 DIAGNOSIS — M6282 Rhabdomyolysis: Secondary | ICD-10-CM | POA: Diagnosis not present

## 2016-11-02 DIAGNOSIS — G35 Multiple sclerosis: Secondary | ICD-10-CM | POA: Diagnosis not present

## 2016-11-02 DIAGNOSIS — F411 Generalized anxiety disorder: Secondary | ICD-10-CM | POA: Diagnosis not present

## 2016-11-07 DIAGNOSIS — M6282 Rhabdomyolysis: Secondary | ICD-10-CM | POA: Diagnosis not present

## 2016-11-07 DIAGNOSIS — F411 Generalized anxiety disorder: Secondary | ICD-10-CM | POA: Diagnosis not present

## 2016-11-07 DIAGNOSIS — G35 Multiple sclerosis: Secondary | ICD-10-CM | POA: Diagnosis not present

## 2016-11-07 DIAGNOSIS — R279 Unspecified lack of coordination: Secondary | ICD-10-CM | POA: Diagnosis not present

## 2016-11-07 DIAGNOSIS — G40909 Epilepsy, unspecified, not intractable, without status epilepticus: Secondary | ICD-10-CM | POA: Diagnosis not present

## 2016-11-07 DIAGNOSIS — M6281 Muscle weakness (generalized): Secondary | ICD-10-CM | POA: Diagnosis not present

## 2016-11-09 DIAGNOSIS — G35 Multiple sclerosis: Secondary | ICD-10-CM | POA: Diagnosis not present

## 2016-11-09 DIAGNOSIS — M6282 Rhabdomyolysis: Secondary | ICD-10-CM | POA: Diagnosis not present

## 2016-11-09 DIAGNOSIS — M6281 Muscle weakness (generalized): Secondary | ICD-10-CM | POA: Diagnosis not present

## 2016-11-09 DIAGNOSIS — F411 Generalized anxiety disorder: Secondary | ICD-10-CM | POA: Diagnosis not present

## 2016-11-09 DIAGNOSIS — R279 Unspecified lack of coordination: Secondary | ICD-10-CM | POA: Diagnosis not present

## 2016-11-09 DIAGNOSIS — G40909 Epilepsy, unspecified, not intractable, without status epilepticus: Secondary | ICD-10-CM | POA: Diagnosis not present

## 2016-11-10 DIAGNOSIS — F411 Generalized anxiety disorder: Secondary | ICD-10-CM | POA: Diagnosis not present

## 2016-11-10 DIAGNOSIS — M6281 Muscle weakness (generalized): Secondary | ICD-10-CM | POA: Diagnosis not present

## 2016-11-10 DIAGNOSIS — G35 Multiple sclerosis: Secondary | ICD-10-CM | POA: Diagnosis not present

## 2016-11-10 DIAGNOSIS — G40909 Epilepsy, unspecified, not intractable, without status epilepticus: Secondary | ICD-10-CM | POA: Diagnosis not present

## 2016-11-10 DIAGNOSIS — M6282 Rhabdomyolysis: Secondary | ICD-10-CM | POA: Diagnosis not present

## 2016-11-10 DIAGNOSIS — R279 Unspecified lack of coordination: Secondary | ICD-10-CM | POA: Diagnosis not present

## 2016-11-13 DIAGNOSIS — G40909 Epilepsy, unspecified, not intractable, without status epilepticus: Secondary | ICD-10-CM | POA: Diagnosis not present

## 2016-11-13 DIAGNOSIS — G35 Multiple sclerosis: Secondary | ICD-10-CM | POA: Diagnosis not present

## 2016-11-13 DIAGNOSIS — M6281 Muscle weakness (generalized): Secondary | ICD-10-CM | POA: Diagnosis not present

## 2016-11-13 DIAGNOSIS — F319 Bipolar disorder, unspecified: Secondary | ICD-10-CM | POA: Diagnosis not present

## 2016-11-13 DIAGNOSIS — F329 Major depressive disorder, single episode, unspecified: Secondary | ICD-10-CM | POA: Diagnosis not present

## 2016-11-13 DIAGNOSIS — F411 Generalized anxiety disorder: Secondary | ICD-10-CM | POA: Diagnosis not present

## 2016-11-13 DIAGNOSIS — R279 Unspecified lack of coordination: Secondary | ICD-10-CM | POA: Diagnosis not present

## 2016-11-13 DIAGNOSIS — M6282 Rhabdomyolysis: Secondary | ICD-10-CM | POA: Diagnosis not present

## 2016-11-14 DIAGNOSIS — F411 Generalized anxiety disorder: Secondary | ICD-10-CM | POA: Diagnosis not present

## 2016-11-14 DIAGNOSIS — G35 Multiple sclerosis: Secondary | ICD-10-CM | POA: Diagnosis not present

## 2016-11-14 DIAGNOSIS — M6281 Muscle weakness (generalized): Secondary | ICD-10-CM | POA: Diagnosis not present

## 2016-11-14 DIAGNOSIS — R279 Unspecified lack of coordination: Secondary | ICD-10-CM | POA: Diagnosis not present

## 2016-11-14 DIAGNOSIS — G40909 Epilepsy, unspecified, not intractable, without status epilepticus: Secondary | ICD-10-CM | POA: Diagnosis not present

## 2016-11-14 DIAGNOSIS — M6282 Rhabdomyolysis: Secondary | ICD-10-CM | POA: Diagnosis not present

## 2016-11-15 DIAGNOSIS — G35 Multiple sclerosis: Secondary | ICD-10-CM | POA: Diagnosis not present

## 2016-11-15 DIAGNOSIS — F411 Generalized anxiety disorder: Secondary | ICD-10-CM | POA: Diagnosis not present

## 2016-11-15 DIAGNOSIS — R279 Unspecified lack of coordination: Secondary | ICD-10-CM | POA: Diagnosis not present

## 2016-11-15 DIAGNOSIS — M6281 Muscle weakness (generalized): Secondary | ICD-10-CM | POA: Diagnosis not present

## 2016-11-15 DIAGNOSIS — M6282 Rhabdomyolysis: Secondary | ICD-10-CM | POA: Diagnosis not present

## 2016-11-15 DIAGNOSIS — G40909 Epilepsy, unspecified, not intractable, without status epilepticus: Secondary | ICD-10-CM | POA: Diagnosis not present

## 2016-11-20 DIAGNOSIS — R279 Unspecified lack of coordination: Secondary | ICD-10-CM | POA: Diagnosis not present

## 2016-11-20 DIAGNOSIS — G40909 Epilepsy, unspecified, not intractable, without status epilepticus: Secondary | ICD-10-CM | POA: Diagnosis not present

## 2016-11-20 DIAGNOSIS — M6282 Rhabdomyolysis: Secondary | ICD-10-CM | POA: Diagnosis not present

## 2016-11-20 DIAGNOSIS — M6281 Muscle weakness (generalized): Secondary | ICD-10-CM | POA: Diagnosis not present

## 2016-11-20 DIAGNOSIS — F411 Generalized anxiety disorder: Secondary | ICD-10-CM | POA: Diagnosis not present

## 2016-11-20 DIAGNOSIS — G35 Multiple sclerosis: Secondary | ICD-10-CM | POA: Diagnosis not present

## 2016-11-21 DIAGNOSIS — E782 Mixed hyperlipidemia: Secondary | ICD-10-CM | POA: Diagnosis not present

## 2016-11-21 DIAGNOSIS — Z79899 Other long term (current) drug therapy: Secondary | ICD-10-CM | POA: Diagnosis not present

## 2016-11-21 DIAGNOSIS — G40909 Epilepsy, unspecified, not intractable, without status epilepticus: Secondary | ICD-10-CM | POA: Diagnosis not present

## 2016-11-21 DIAGNOSIS — R279 Unspecified lack of coordination: Secondary | ICD-10-CM | POA: Diagnosis not present

## 2016-11-21 DIAGNOSIS — F411 Generalized anxiety disorder: Secondary | ICD-10-CM | POA: Diagnosis not present

## 2016-11-21 DIAGNOSIS — M6281 Muscle weakness (generalized): Secondary | ICD-10-CM | POA: Diagnosis not present

## 2016-11-21 DIAGNOSIS — E119 Type 2 diabetes mellitus without complications: Secondary | ICD-10-CM | POA: Diagnosis not present

## 2016-11-21 DIAGNOSIS — D518 Other vitamin B12 deficiency anemias: Secondary | ICD-10-CM | POA: Diagnosis not present

## 2016-11-21 DIAGNOSIS — G35 Multiple sclerosis: Secondary | ICD-10-CM | POA: Diagnosis not present

## 2016-11-21 DIAGNOSIS — M6282 Rhabdomyolysis: Secondary | ICD-10-CM | POA: Diagnosis not present

## 2016-11-23 DIAGNOSIS — M6282 Rhabdomyolysis: Secondary | ICD-10-CM | POA: Diagnosis not present

## 2016-11-23 DIAGNOSIS — R279 Unspecified lack of coordination: Secondary | ICD-10-CM | POA: Diagnosis not present

## 2016-11-23 DIAGNOSIS — F411 Generalized anxiety disorder: Secondary | ICD-10-CM | POA: Diagnosis not present

## 2016-11-23 DIAGNOSIS — G40909 Epilepsy, unspecified, not intractable, without status epilepticus: Secondary | ICD-10-CM | POA: Diagnosis not present

## 2016-11-23 DIAGNOSIS — I110 Hypertensive heart disease with heart failure: Secondary | ICD-10-CM | POA: Diagnosis not present

## 2016-11-23 DIAGNOSIS — M6281 Muscle weakness (generalized): Secondary | ICD-10-CM | POA: Diagnosis not present

## 2016-11-23 DIAGNOSIS — G35 Multiple sclerosis: Secondary | ICD-10-CM | POA: Diagnosis not present

## 2016-11-27 DIAGNOSIS — F411 Generalized anxiety disorder: Secondary | ICD-10-CM | POA: Diagnosis not present

## 2016-11-27 DIAGNOSIS — G40909 Epilepsy, unspecified, not intractable, without status epilepticus: Secondary | ICD-10-CM | POA: Diagnosis not present

## 2016-11-27 DIAGNOSIS — M6281 Muscle weakness (generalized): Secondary | ICD-10-CM | POA: Diagnosis not present

## 2016-11-27 DIAGNOSIS — G35 Multiple sclerosis: Secondary | ICD-10-CM | POA: Diagnosis not present

## 2016-11-27 DIAGNOSIS — M6282 Rhabdomyolysis: Secondary | ICD-10-CM | POA: Diagnosis not present

## 2016-11-27 DIAGNOSIS — R279 Unspecified lack of coordination: Secondary | ICD-10-CM | POA: Diagnosis not present

## 2016-11-29 DIAGNOSIS — R279 Unspecified lack of coordination: Secondary | ICD-10-CM | POA: Diagnosis not present

## 2016-11-29 DIAGNOSIS — G40909 Epilepsy, unspecified, not intractable, without status epilepticus: Secondary | ICD-10-CM | POA: Diagnosis not present

## 2016-11-29 DIAGNOSIS — F411 Generalized anxiety disorder: Secondary | ICD-10-CM | POA: Diagnosis not present

## 2016-11-29 DIAGNOSIS — M6282 Rhabdomyolysis: Secondary | ICD-10-CM | POA: Diagnosis not present

## 2016-11-29 DIAGNOSIS — G35 Multiple sclerosis: Secondary | ICD-10-CM | POA: Diagnosis not present

## 2016-11-29 DIAGNOSIS — M6281 Muscle weakness (generalized): Secondary | ICD-10-CM | POA: Diagnosis not present

## 2016-11-30 DIAGNOSIS — K5904 Chronic idiopathic constipation: Secondary | ICD-10-CM | POA: Diagnosis not present

## 2016-12-01 DIAGNOSIS — G35 Multiple sclerosis: Secondary | ICD-10-CM | POA: Diagnosis not present

## 2016-12-01 DIAGNOSIS — F411 Generalized anxiety disorder: Secondary | ICD-10-CM | POA: Diagnosis not present

## 2016-12-01 DIAGNOSIS — R279 Unspecified lack of coordination: Secondary | ICD-10-CM | POA: Diagnosis not present

## 2016-12-01 DIAGNOSIS — G40909 Epilepsy, unspecified, not intractable, without status epilepticus: Secondary | ICD-10-CM | POA: Diagnosis not present

## 2016-12-01 DIAGNOSIS — M6282 Rhabdomyolysis: Secondary | ICD-10-CM | POA: Diagnosis not present

## 2016-12-01 DIAGNOSIS — M6281 Muscle weakness (generalized): Secondary | ICD-10-CM | POA: Diagnosis not present

## 2016-12-04 DIAGNOSIS — G35 Multiple sclerosis: Secondary | ICD-10-CM | POA: Diagnosis not present

## 2016-12-04 DIAGNOSIS — F209 Schizophrenia, unspecified: Secondary | ICD-10-CM | POA: Diagnosis not present

## 2016-12-04 DIAGNOSIS — M6281 Muscle weakness (generalized): Secondary | ICD-10-CM | POA: Diagnosis not present

## 2016-12-04 DIAGNOSIS — R278 Other lack of coordination: Secondary | ICD-10-CM | POA: Diagnosis not present

## 2016-12-04 DIAGNOSIS — G40909 Epilepsy, unspecified, not intractable, without status epilepticus: Secondary | ICD-10-CM | POA: Diagnosis not present

## 2016-12-04 DIAGNOSIS — F0151 Vascular dementia with behavioral disturbance: Secondary | ICD-10-CM | POA: Diagnosis not present

## 2016-12-06 DIAGNOSIS — G40909 Epilepsy, unspecified, not intractable, without status epilepticus: Secondary | ICD-10-CM | POA: Diagnosis not present

## 2016-12-06 DIAGNOSIS — F0151 Vascular dementia with behavioral disturbance: Secondary | ICD-10-CM | POA: Diagnosis not present

## 2016-12-06 DIAGNOSIS — G35 Multiple sclerosis: Secondary | ICD-10-CM | POA: Diagnosis not present

## 2016-12-06 DIAGNOSIS — F209 Schizophrenia, unspecified: Secondary | ICD-10-CM | POA: Diagnosis not present

## 2016-12-06 DIAGNOSIS — M6281 Muscle weakness (generalized): Secondary | ICD-10-CM | POA: Diagnosis not present

## 2016-12-06 DIAGNOSIS — R278 Other lack of coordination: Secondary | ICD-10-CM | POA: Diagnosis not present

## 2016-12-08 DIAGNOSIS — M6281 Muscle weakness (generalized): Secondary | ICD-10-CM | POA: Diagnosis not present

## 2016-12-08 DIAGNOSIS — G40909 Epilepsy, unspecified, not intractable, without status epilepticus: Secondary | ICD-10-CM | POA: Diagnosis not present

## 2016-12-08 DIAGNOSIS — F209 Schizophrenia, unspecified: Secondary | ICD-10-CM | POA: Diagnosis not present

## 2016-12-08 DIAGNOSIS — G35 Multiple sclerosis: Secondary | ICD-10-CM | POA: Diagnosis not present

## 2016-12-08 DIAGNOSIS — F0151 Vascular dementia with behavioral disturbance: Secondary | ICD-10-CM | POA: Diagnosis not present

## 2016-12-08 DIAGNOSIS — R278 Other lack of coordination: Secondary | ICD-10-CM | POA: Diagnosis not present

## 2016-12-19 DIAGNOSIS — L84 Corns and callosities: Secondary | ICD-10-CM | POA: Diagnosis not present

## 2016-12-19 DIAGNOSIS — M79675 Pain in left toe(s): Secondary | ICD-10-CM | POA: Diagnosis not present

## 2016-12-19 DIAGNOSIS — B351 Tinea unguium: Secondary | ICD-10-CM | POA: Diagnosis not present

## 2016-12-19 DIAGNOSIS — E1165 Type 2 diabetes mellitus with hyperglycemia: Secondary | ICD-10-CM | POA: Diagnosis not present

## 2016-12-20 DIAGNOSIS — G40909 Epilepsy, unspecified, not intractable, without status epilepticus: Secondary | ICD-10-CM | POA: Diagnosis not present

## 2016-12-20 DIAGNOSIS — G35 Multiple sclerosis: Secondary | ICD-10-CM | POA: Diagnosis not present

## 2016-12-20 DIAGNOSIS — I6300 Cerebral infarction due to thrombosis of unspecified precerebral artery: Secondary | ICD-10-CM | POA: Diagnosis not present

## 2016-12-20 DIAGNOSIS — I110 Hypertensive heart disease with heart failure: Secondary | ICD-10-CM | POA: Diagnosis not present

## 2016-12-31 DIAGNOSIS — E785 Hyperlipidemia, unspecified: Secondary | ICD-10-CM | POA: Diagnosis not present

## 2016-12-31 DIAGNOSIS — Z79899 Other long term (current) drug therapy: Secondary | ICD-10-CM | POA: Diagnosis not present

## 2016-12-31 DIAGNOSIS — R799 Abnormal finding of blood chemistry, unspecified: Secondary | ICD-10-CM | POA: Diagnosis not present

## 2016-12-31 DIAGNOSIS — R4182 Altered mental status, unspecified: Secondary | ICD-10-CM | POA: Diagnosis not present

## 2016-12-31 DIAGNOSIS — F801 Expressive language disorder: Secondary | ICD-10-CM | POA: Diagnosis not present

## 2016-12-31 DIAGNOSIS — R569 Unspecified convulsions: Secondary | ICD-10-CM | POA: Diagnosis not present

## 2016-12-31 DIAGNOSIS — I10 Essential (primary) hypertension: Secondary | ICD-10-CM | POA: Diagnosis not present

## 2016-12-31 DIAGNOSIS — R404 Transient alteration of awareness: Secondary | ICD-10-CM | POA: Diagnosis not present

## 2016-12-31 DIAGNOSIS — Z7982 Long term (current) use of aspirin: Secondary | ICD-10-CM | POA: Diagnosis not present

## 2016-12-31 DIAGNOSIS — R061 Stridor: Secondary | ICD-10-CM | POA: Diagnosis not present

## 2017-01-01 DIAGNOSIS — G40909 Epilepsy, unspecified, not intractable, without status epilepticus: Secondary | ICD-10-CM | POA: Diagnosis not present

## 2017-01-01 DIAGNOSIS — F0150 Vascular dementia without behavioral disturbance: Secondary | ICD-10-CM | POA: Diagnosis not present

## 2017-01-01 DIAGNOSIS — I6300 Cerebral infarction due to thrombosis of unspecified precerebral artery: Secondary | ICD-10-CM | POA: Diagnosis not present

## 2017-01-01 DIAGNOSIS — Z7401 Bed confinement status: Secondary | ICD-10-CM | POA: Diagnosis not present

## 2017-01-01 DIAGNOSIS — R279 Unspecified lack of coordination: Secondary | ICD-10-CM | POA: Diagnosis not present

## 2017-01-01 DIAGNOSIS — G35 Multiple sclerosis: Secondary | ICD-10-CM | POA: Diagnosis not present

## 2017-01-01 DIAGNOSIS — N390 Urinary tract infection, site not specified: Secondary | ICD-10-CM | POA: Diagnosis not present

## 2017-01-02 DIAGNOSIS — E119 Type 2 diabetes mellitus without complications: Secondary | ICD-10-CM | POA: Diagnosis not present

## 2017-01-02 DIAGNOSIS — Z79899 Other long term (current) drug therapy: Secondary | ICD-10-CM | POA: Diagnosis not present

## 2017-01-15 DIAGNOSIS — H524 Presbyopia: Secondary | ICD-10-CM | POA: Diagnosis not present

## 2017-01-15 DIAGNOSIS — E119 Type 2 diabetes mellitus without complications: Secondary | ICD-10-CM | POA: Diagnosis not present

## 2017-01-15 DIAGNOSIS — H40023 Open angle with borderline findings, high risk, bilateral: Secondary | ICD-10-CM | POA: Diagnosis not present

## 2017-01-15 DIAGNOSIS — H2513 Age-related nuclear cataract, bilateral: Secondary | ICD-10-CM | POA: Diagnosis not present

## 2017-01-17 DIAGNOSIS — I110 Hypertensive heart disease with heart failure: Secondary | ICD-10-CM | POA: Diagnosis not present

## 2017-01-17 DIAGNOSIS — G40909 Epilepsy, unspecified, not intractable, without status epilepticus: Secondary | ICD-10-CM | POA: Diagnosis not present

## 2017-01-17 DIAGNOSIS — E1165 Type 2 diabetes mellitus with hyperglycemia: Secondary | ICD-10-CM | POA: Diagnosis not present

## 2017-01-17 DIAGNOSIS — G35 Multiple sclerosis: Secondary | ICD-10-CM | POA: Diagnosis not present

## 2017-02-06 DIAGNOSIS — Z79899 Other long term (current) drug therapy: Secondary | ICD-10-CM | POA: Diagnosis not present

## 2017-03-13 DIAGNOSIS — E119 Type 2 diabetes mellitus without complications: Secondary | ICD-10-CM | POA: Diagnosis not present

## 2017-03-13 DIAGNOSIS — G35 Multiple sclerosis: Secondary | ICD-10-CM | POA: Diagnosis not present

## 2017-03-13 DIAGNOSIS — Z23 Encounter for immunization: Secondary | ICD-10-CM | POA: Diagnosis not present

## 2017-03-13 DIAGNOSIS — I110 Hypertensive heart disease with heart failure: Secondary | ICD-10-CM | POA: Diagnosis not present

## 2017-03-13 DIAGNOSIS — G40909 Epilepsy, unspecified, not intractable, without status epilepticus: Secondary | ICD-10-CM | POA: Diagnosis not present

## 2017-03-22 DIAGNOSIS — G35 Multiple sclerosis: Secondary | ICD-10-CM | POA: Diagnosis not present

## 2017-03-22 DIAGNOSIS — F0151 Vascular dementia with behavioral disturbance: Secondary | ICD-10-CM | POA: Diagnosis not present

## 2017-03-22 DIAGNOSIS — G40909 Epilepsy, unspecified, not intractable, without status epilepticus: Secondary | ICD-10-CM | POA: Diagnosis not present

## 2017-03-22 DIAGNOSIS — F209 Schizophrenia, unspecified: Secondary | ICD-10-CM | POA: Diagnosis not present

## 2017-03-22 DIAGNOSIS — R278 Other lack of coordination: Secondary | ICD-10-CM | POA: Diagnosis not present

## 2017-03-22 DIAGNOSIS — M6281 Muscle weakness (generalized): Secondary | ICD-10-CM | POA: Diagnosis not present

## 2017-03-23 DIAGNOSIS — R278 Other lack of coordination: Secondary | ICD-10-CM | POA: Diagnosis not present

## 2017-03-23 DIAGNOSIS — G40909 Epilepsy, unspecified, not intractable, without status epilepticus: Secondary | ICD-10-CM | POA: Diagnosis not present

## 2017-03-23 DIAGNOSIS — F0151 Vascular dementia with behavioral disturbance: Secondary | ICD-10-CM | POA: Diagnosis not present

## 2017-03-23 DIAGNOSIS — G35 Multiple sclerosis: Secondary | ICD-10-CM | POA: Diagnosis not present

## 2017-03-23 DIAGNOSIS — F209 Schizophrenia, unspecified: Secondary | ICD-10-CM | POA: Diagnosis not present

## 2017-03-23 DIAGNOSIS — M6281 Muscle weakness (generalized): Secondary | ICD-10-CM | POA: Diagnosis not present

## 2017-03-24 DIAGNOSIS — G35 Multiple sclerosis: Secondary | ICD-10-CM | POA: Diagnosis not present

## 2017-03-24 DIAGNOSIS — R278 Other lack of coordination: Secondary | ICD-10-CM | POA: Diagnosis not present

## 2017-03-24 DIAGNOSIS — M6281 Muscle weakness (generalized): Secondary | ICD-10-CM | POA: Diagnosis not present

## 2017-03-24 DIAGNOSIS — F209 Schizophrenia, unspecified: Secondary | ICD-10-CM | POA: Diagnosis not present

## 2017-03-24 DIAGNOSIS — F0151 Vascular dementia with behavioral disturbance: Secondary | ICD-10-CM | POA: Diagnosis not present

## 2017-03-24 DIAGNOSIS — G40909 Epilepsy, unspecified, not intractable, without status epilepticus: Secondary | ICD-10-CM | POA: Diagnosis not present

## 2017-03-26 DIAGNOSIS — F209 Schizophrenia, unspecified: Secondary | ICD-10-CM | POA: Diagnosis not present

## 2017-03-26 DIAGNOSIS — G40909 Epilepsy, unspecified, not intractable, without status epilepticus: Secondary | ICD-10-CM | POA: Diagnosis not present

## 2017-03-26 DIAGNOSIS — R260 Ataxic gait: Secondary | ICD-10-CM | POA: Diagnosis not present

## 2017-03-26 DIAGNOSIS — R278 Other lack of coordination: Secondary | ICD-10-CM | POA: Diagnosis not present

## 2017-03-26 DIAGNOSIS — M6281 Muscle weakness (generalized): Secondary | ICD-10-CM | POA: Diagnosis not present

## 2017-03-26 DIAGNOSIS — Z79899 Other long term (current) drug therapy: Secondary | ICD-10-CM | POA: Diagnosis not present

## 2017-03-26 DIAGNOSIS — G40419 Other generalized epilepsy and epileptic syndromes, intractable, without status epilepticus: Secondary | ICD-10-CM | POA: Diagnosis not present

## 2017-03-26 DIAGNOSIS — F0151 Vascular dementia with behavioral disturbance: Secondary | ICD-10-CM | POA: Diagnosis not present

## 2017-03-26 DIAGNOSIS — G35 Multiple sclerosis: Secondary | ICD-10-CM | POA: Diagnosis not present

## 2017-03-27 DIAGNOSIS — G40909 Epilepsy, unspecified, not intractable, without status epilepticus: Secondary | ICD-10-CM | POA: Diagnosis not present

## 2017-03-27 DIAGNOSIS — G35 Multiple sclerosis: Secondary | ICD-10-CM | POA: Diagnosis not present

## 2017-03-27 DIAGNOSIS — F0151 Vascular dementia with behavioral disturbance: Secondary | ICD-10-CM | POA: Diagnosis not present

## 2017-03-27 DIAGNOSIS — E1165 Type 2 diabetes mellitus with hyperglycemia: Secondary | ICD-10-CM | POA: Diagnosis not present

## 2017-03-27 DIAGNOSIS — B351 Tinea unguium: Secondary | ICD-10-CM | POA: Diagnosis not present

## 2017-03-27 DIAGNOSIS — R278 Other lack of coordination: Secondary | ICD-10-CM | POA: Diagnosis not present

## 2017-03-27 DIAGNOSIS — M6281 Muscle weakness (generalized): Secondary | ICD-10-CM | POA: Diagnosis not present

## 2017-03-27 DIAGNOSIS — L84 Corns and callosities: Secondary | ICD-10-CM | POA: Diagnosis not present

## 2017-03-27 DIAGNOSIS — F209 Schizophrenia, unspecified: Secondary | ICD-10-CM | POA: Diagnosis not present

## 2017-03-27 DIAGNOSIS — M79671 Pain in right foot: Secondary | ICD-10-CM | POA: Diagnosis not present

## 2017-03-28 DIAGNOSIS — F209 Schizophrenia, unspecified: Secondary | ICD-10-CM | POA: Diagnosis not present

## 2017-03-28 DIAGNOSIS — M6281 Muscle weakness (generalized): Secondary | ICD-10-CM | POA: Diagnosis not present

## 2017-03-28 DIAGNOSIS — F0151 Vascular dementia with behavioral disturbance: Secondary | ICD-10-CM | POA: Diagnosis not present

## 2017-03-28 DIAGNOSIS — G40909 Epilepsy, unspecified, not intractable, without status epilepticus: Secondary | ICD-10-CM | POA: Diagnosis not present

## 2017-03-28 DIAGNOSIS — R278 Other lack of coordination: Secondary | ICD-10-CM | POA: Diagnosis not present

## 2017-03-28 DIAGNOSIS — G35 Multiple sclerosis: Secondary | ICD-10-CM | POA: Diagnosis not present

## 2017-03-29 DIAGNOSIS — M6281 Muscle weakness (generalized): Secondary | ICD-10-CM | POA: Diagnosis not present

## 2017-03-29 DIAGNOSIS — R278 Other lack of coordination: Secondary | ICD-10-CM | POA: Diagnosis not present

## 2017-03-29 DIAGNOSIS — G35 Multiple sclerosis: Secondary | ICD-10-CM | POA: Diagnosis not present

## 2017-03-29 DIAGNOSIS — F0151 Vascular dementia with behavioral disturbance: Secondary | ICD-10-CM | POA: Diagnosis not present

## 2017-03-29 DIAGNOSIS — F209 Schizophrenia, unspecified: Secondary | ICD-10-CM | POA: Diagnosis not present

## 2017-03-29 DIAGNOSIS — G40909 Epilepsy, unspecified, not intractable, without status epilepticus: Secondary | ICD-10-CM | POA: Diagnosis not present

## 2017-03-30 DIAGNOSIS — R278 Other lack of coordination: Secondary | ICD-10-CM | POA: Diagnosis not present

## 2017-03-30 DIAGNOSIS — F0151 Vascular dementia with behavioral disturbance: Secondary | ICD-10-CM | POA: Diagnosis not present

## 2017-03-30 DIAGNOSIS — G40909 Epilepsy, unspecified, not intractable, without status epilepticus: Secondary | ICD-10-CM | POA: Diagnosis not present

## 2017-03-30 DIAGNOSIS — F209 Schizophrenia, unspecified: Secondary | ICD-10-CM | POA: Diagnosis not present

## 2017-03-30 DIAGNOSIS — G35 Multiple sclerosis: Secondary | ICD-10-CM | POA: Diagnosis not present

## 2017-03-30 DIAGNOSIS — M6281 Muscle weakness (generalized): Secondary | ICD-10-CM | POA: Diagnosis not present

## 2017-04-02 DIAGNOSIS — F209 Schizophrenia, unspecified: Secondary | ICD-10-CM | POA: Diagnosis not present

## 2017-04-02 DIAGNOSIS — G35 Multiple sclerosis: Secondary | ICD-10-CM | POA: Diagnosis not present

## 2017-04-02 DIAGNOSIS — M6281 Muscle weakness (generalized): Secondary | ICD-10-CM | POA: Diagnosis not present

## 2017-04-02 DIAGNOSIS — G40909 Epilepsy, unspecified, not intractable, without status epilepticus: Secondary | ICD-10-CM | POA: Diagnosis not present

## 2017-04-02 DIAGNOSIS — F0151 Vascular dementia with behavioral disturbance: Secondary | ICD-10-CM | POA: Diagnosis not present

## 2017-04-02 DIAGNOSIS — R278 Other lack of coordination: Secondary | ICD-10-CM | POA: Diagnosis not present

## 2017-04-03 DIAGNOSIS — G40909 Epilepsy, unspecified, not intractable, without status epilepticus: Secondary | ICD-10-CM | POA: Diagnosis not present

## 2017-04-03 DIAGNOSIS — G35 Multiple sclerosis: Secondary | ICD-10-CM | POA: Diagnosis not present

## 2017-04-03 DIAGNOSIS — F0151 Vascular dementia with behavioral disturbance: Secondary | ICD-10-CM | POA: Diagnosis not present

## 2017-04-03 DIAGNOSIS — F209 Schizophrenia, unspecified: Secondary | ICD-10-CM | POA: Diagnosis not present

## 2017-04-03 DIAGNOSIS — R278 Other lack of coordination: Secondary | ICD-10-CM | POA: Diagnosis not present

## 2017-04-03 DIAGNOSIS — M6281 Muscle weakness (generalized): Secondary | ICD-10-CM | POA: Diagnosis not present

## 2017-04-04 DIAGNOSIS — R278 Other lack of coordination: Secondary | ICD-10-CM | POA: Diagnosis not present

## 2017-04-04 DIAGNOSIS — M6281 Muscle weakness (generalized): Secondary | ICD-10-CM | POA: Diagnosis not present

## 2017-04-04 DIAGNOSIS — F209 Schizophrenia, unspecified: Secondary | ICD-10-CM | POA: Diagnosis not present

## 2017-04-04 DIAGNOSIS — G40909 Epilepsy, unspecified, not intractable, without status epilepticus: Secondary | ICD-10-CM | POA: Diagnosis not present

## 2017-04-04 DIAGNOSIS — F0151 Vascular dementia with behavioral disturbance: Secondary | ICD-10-CM | POA: Diagnosis not present

## 2017-04-04 DIAGNOSIS — G35 Multiple sclerosis: Secondary | ICD-10-CM | POA: Diagnosis not present

## 2017-04-05 DIAGNOSIS — G35 Multiple sclerosis: Secondary | ICD-10-CM | POA: Diagnosis not present

## 2017-04-05 DIAGNOSIS — F0151 Vascular dementia with behavioral disturbance: Secondary | ICD-10-CM | POA: Diagnosis not present

## 2017-04-05 DIAGNOSIS — M6281 Muscle weakness (generalized): Secondary | ICD-10-CM | POA: Diagnosis not present

## 2017-04-05 DIAGNOSIS — F209 Schizophrenia, unspecified: Secondary | ICD-10-CM | POA: Diagnosis not present

## 2017-04-05 DIAGNOSIS — R278 Other lack of coordination: Secondary | ICD-10-CM | POA: Diagnosis not present

## 2017-04-05 DIAGNOSIS — G40909 Epilepsy, unspecified, not intractable, without status epilepticus: Secondary | ICD-10-CM | POA: Diagnosis not present

## 2017-04-06 DIAGNOSIS — M6281 Muscle weakness (generalized): Secondary | ICD-10-CM | POA: Diagnosis not present

## 2017-04-06 DIAGNOSIS — F0150 Vascular dementia without behavioral disturbance: Secondary | ICD-10-CM | POA: Diagnosis not present

## 2017-04-06 DIAGNOSIS — G40909 Epilepsy, unspecified, not intractable, without status epilepticus: Secondary | ICD-10-CM | POA: Diagnosis not present

## 2017-04-06 DIAGNOSIS — G35 Multiple sclerosis: Secondary | ICD-10-CM | POA: Diagnosis not present

## 2017-04-06 DIAGNOSIS — F0151 Vascular dementia with behavioral disturbance: Secondary | ICD-10-CM | POA: Diagnosis not present

## 2017-04-06 DIAGNOSIS — E119 Type 2 diabetes mellitus without complications: Secondary | ICD-10-CM | POA: Diagnosis not present

## 2017-04-06 DIAGNOSIS — F209 Schizophrenia, unspecified: Secondary | ICD-10-CM | POA: Diagnosis not present

## 2017-04-06 DIAGNOSIS — R278 Other lack of coordination: Secondary | ICD-10-CM | POA: Diagnosis not present

## 2017-04-09 DIAGNOSIS — G40909 Epilepsy, unspecified, not intractable, without status epilepticus: Secondary | ICD-10-CM | POA: Diagnosis not present

## 2017-04-09 DIAGNOSIS — R278 Other lack of coordination: Secondary | ICD-10-CM | POA: Diagnosis not present

## 2017-04-09 DIAGNOSIS — F209 Schizophrenia, unspecified: Secondary | ICD-10-CM | POA: Diagnosis not present

## 2017-04-09 DIAGNOSIS — M6281 Muscle weakness (generalized): Secondary | ICD-10-CM | POA: Diagnosis not present

## 2017-04-09 DIAGNOSIS — F0151 Vascular dementia with behavioral disturbance: Secondary | ICD-10-CM | POA: Diagnosis not present

## 2017-04-09 DIAGNOSIS — G35 Multiple sclerosis: Secondary | ICD-10-CM | POA: Diagnosis not present

## 2017-04-10 DIAGNOSIS — R278 Other lack of coordination: Secondary | ICD-10-CM | POA: Diagnosis not present

## 2017-04-10 DIAGNOSIS — G35 Multiple sclerosis: Secondary | ICD-10-CM | POA: Diagnosis not present

## 2017-04-10 DIAGNOSIS — G40909 Epilepsy, unspecified, not intractable, without status epilepticus: Secondary | ICD-10-CM | POA: Diagnosis not present

## 2017-04-10 DIAGNOSIS — F0151 Vascular dementia with behavioral disturbance: Secondary | ICD-10-CM | POA: Diagnosis not present

## 2017-04-10 DIAGNOSIS — F209 Schizophrenia, unspecified: Secondary | ICD-10-CM | POA: Diagnosis not present

## 2017-04-10 DIAGNOSIS — M6281 Muscle weakness (generalized): Secondary | ICD-10-CM | POA: Diagnosis not present

## 2017-04-11 DIAGNOSIS — Z79899 Other long term (current) drug therapy: Secondary | ICD-10-CM | POA: Diagnosis not present

## 2017-04-11 DIAGNOSIS — E119 Type 2 diabetes mellitus without complications: Secondary | ICD-10-CM | POA: Diagnosis not present

## 2017-04-11 DIAGNOSIS — D518 Other vitamin B12 deficiency anemias: Secondary | ICD-10-CM | POA: Diagnosis not present

## 2017-04-11 DIAGNOSIS — E7849 Other hyperlipidemia: Secondary | ICD-10-CM | POA: Diagnosis not present

## 2017-05-02 DIAGNOSIS — I110 Hypertensive heart disease with heart failure: Secondary | ICD-10-CM | POA: Diagnosis not present

## 2017-05-02 DIAGNOSIS — G35 Multiple sclerosis: Secondary | ICD-10-CM | POA: Diagnosis not present

## 2017-05-02 DIAGNOSIS — F319 Bipolar disorder, unspecified: Secondary | ICD-10-CM | POA: Diagnosis not present

## 2017-05-02 DIAGNOSIS — E1165 Type 2 diabetes mellitus with hyperglycemia: Secondary | ICD-10-CM | POA: Diagnosis not present

## 2017-05-25 DIAGNOSIS — F0150 Vascular dementia without behavioral disturbance: Secondary | ICD-10-CM | POA: Diagnosis not present

## 2017-05-25 DIAGNOSIS — E1165 Type 2 diabetes mellitus with hyperglycemia: Secondary | ICD-10-CM | POA: Diagnosis not present

## 2017-05-25 DIAGNOSIS — I110 Hypertensive heart disease with heart failure: Secondary | ICD-10-CM | POA: Diagnosis not present

## 2017-05-25 DIAGNOSIS — G35 Multiple sclerosis: Secondary | ICD-10-CM | POA: Diagnosis not present

## 2017-06-11 DIAGNOSIS — E119 Type 2 diabetes mellitus without complications: Secondary | ICD-10-CM | POA: Diagnosis not present

## 2017-06-11 DIAGNOSIS — Z79899 Other long term (current) drug therapy: Secondary | ICD-10-CM | POA: Diagnosis not present

## 2017-06-11 DIAGNOSIS — E038 Other specified hypothyroidism: Secondary | ICD-10-CM | POA: Diagnosis not present

## 2017-06-11 DIAGNOSIS — D518 Other vitamin B12 deficiency anemias: Secondary | ICD-10-CM | POA: Diagnosis not present

## 2018-05-23 ENCOUNTER — Encounter (HOSPITAL_COMMUNITY): Payer: Self-pay

## 2018-05-23 ENCOUNTER — Emergency Department (HOSPITAL_COMMUNITY): Payer: Medicare Other

## 2018-05-23 ENCOUNTER — Other Ambulatory Visit: Payer: Self-pay

## 2018-05-23 ENCOUNTER — Inpatient Hospital Stay (HOSPITAL_COMMUNITY)
Admission: EM | Admit: 2018-05-23 | Discharge: 2018-06-05 | DRG: 871 | Disposition: A | Discharge status: Skilled Nursing Facility | Payer: Medicare Other | Attending: Internal Medicine | Admitting: Internal Medicine

## 2018-05-23 DIAGNOSIS — J189 Pneumonia, unspecified organism: Secondary | ICD-10-CM

## 2018-05-23 DIAGNOSIS — Z8719 Personal history of other diseases of the digestive system: Secondary | ICD-10-CM | POA: Diagnosis not present

## 2018-05-23 DIAGNOSIS — Z8673 Personal history of transient ischemic attack (TIA), and cerebral infarction without residual deficits: Secondary | ICD-10-CM | POA: Diagnosis not present

## 2018-05-23 DIAGNOSIS — R0902 Hypoxemia: Secondary | ICD-10-CM | POA: Diagnosis not present

## 2018-05-23 DIAGNOSIS — Z7982 Long term (current) use of aspirin: Secondary | ICD-10-CM | POA: Diagnosis not present

## 2018-05-23 DIAGNOSIS — R1310 Dysphagia, unspecified: Secondary | ICD-10-CM | POA: Diagnosis present

## 2018-05-23 DIAGNOSIS — R6510 Systemic inflammatory response syndrome (SIRS) of non-infectious origin without acute organ dysfunction: Secondary | ICD-10-CM | POA: Diagnosis present

## 2018-05-23 DIAGNOSIS — Z79899 Other long term (current) drug therapy: Secondary | ICD-10-CM | POA: Diagnosis not present

## 2018-05-23 DIAGNOSIS — Z818 Family history of other mental and behavioral disorders: Secondary | ICD-10-CM

## 2018-05-23 DIAGNOSIS — F17210 Nicotine dependence, cigarettes, uncomplicated: Secondary | ICD-10-CM | POA: Diagnosis present

## 2018-05-23 DIAGNOSIS — J101 Influenza due to other identified influenza virus with other respiratory manifestations: Secondary | ICD-10-CM | POA: Diagnosis present

## 2018-05-23 DIAGNOSIS — R339 Retention of urine, unspecified: Secondary | ICD-10-CM | POA: Diagnosis not present

## 2018-05-23 DIAGNOSIS — J9601 Acute respiratory failure with hypoxia: Secondary | ICD-10-CM | POA: Diagnosis present

## 2018-05-23 DIAGNOSIS — I509 Heart failure, unspecified: Secondary | ICD-10-CM | POA: Diagnosis present

## 2018-05-23 DIAGNOSIS — E876 Hypokalemia: Secondary | ICD-10-CM | POA: Diagnosis present

## 2018-05-23 DIAGNOSIS — J690 Pneumonitis due to inhalation of food and vomit: Secondary | ICD-10-CM | POA: Diagnosis present

## 2018-05-23 DIAGNOSIS — G35 Multiple sclerosis: Secondary | ICD-10-CM | POA: Diagnosis present

## 2018-05-23 DIAGNOSIS — E785 Hyperlipidemia, unspecified: Secondary | ICD-10-CM | POA: Diagnosis present

## 2018-05-23 DIAGNOSIS — I110 Hypertensive heart disease with heart failure: Secondary | ICD-10-CM | POA: Diagnosis present

## 2018-05-23 DIAGNOSIS — E119 Type 2 diabetes mellitus without complications: Secondary | ICD-10-CM | POA: Diagnosis present

## 2018-05-23 DIAGNOSIS — J9621 Acute and chronic respiratory failure with hypoxia: Secondary | ICD-10-CM | POA: Diagnosis present

## 2018-05-23 DIAGNOSIS — A419 Sepsis, unspecified organism: Secondary | ICD-10-CM | POA: Diagnosis present

## 2018-05-23 DIAGNOSIS — F419 Anxiety disorder, unspecified: Secondary | ICD-10-CM | POA: Diagnosis present

## 2018-05-23 DIAGNOSIS — Z8669 Personal history of other diseases of the nervous system and sense organs: Secondary | ICD-10-CM | POA: Diagnosis not present

## 2018-05-23 DIAGNOSIS — J1000 Influenza due to other identified influenza virus with unspecified type of pneumonia: Secondary | ICD-10-CM | POA: Diagnosis present

## 2018-05-23 DIAGNOSIS — Z8249 Family history of ischemic heart disease and other diseases of the circulatory system: Secondary | ICD-10-CM | POA: Diagnosis not present

## 2018-05-23 DIAGNOSIS — Z833 Family history of diabetes mellitus: Secondary | ICD-10-CM | POA: Diagnosis not present

## 2018-05-23 DIAGNOSIS — F0150 Vascular dementia without behavioral disturbance: Secondary | ICD-10-CM | POA: Diagnosis present

## 2018-05-23 DIAGNOSIS — Z515 Encounter for palliative care: Secondary | ICD-10-CM

## 2018-05-23 DIAGNOSIS — R0602 Shortness of breath: Secondary | ICD-10-CM

## 2018-05-23 DIAGNOSIS — F319 Bipolar disorder, unspecified: Secondary | ICD-10-CM | POA: Diagnosis present

## 2018-05-23 DIAGNOSIS — G40909 Epilepsy, unspecified, not intractable, without status epilepticus: Secondary | ICD-10-CM

## 2018-05-23 DIAGNOSIS — Z7189 Other specified counseling: Secondary | ICD-10-CM | POA: Diagnosis not present

## 2018-05-23 HISTORY — DX: Bipolar disorder, unspecified: F31.9

## 2018-05-23 HISTORY — DX: Essential (primary) hypertension: I10

## 2018-05-23 HISTORY — DX: Heart failure, unspecified: I50.9

## 2018-05-23 HISTORY — DX: Type 2 diabetes mellitus without complications: E11.9

## 2018-05-23 HISTORY — DX: Vascular dementia, unspecified severity, without behavioral disturbance, psychotic disturbance, mood disturbance, and anxiety: F01.50

## 2018-05-23 HISTORY — DX: Major depressive disorder, single episode, unspecified: F32.9

## 2018-05-23 LAB — COMPREHENSIVE METABOLIC PANEL
ALBUMIN: 2.9 g/dL — AB (ref 3.5–5.0)
ALT: 20 U/L (ref 0–44)
AST: 27 U/L (ref 15–41)
Alkaline Phosphatase: 48 U/L (ref 38–126)
Anion gap: 8 (ref 5–15)
BILIRUBIN TOTAL: 0.6 mg/dL (ref 0.3–1.2)
BUN: 9 mg/dL (ref 6–20)
CO2: 26 mmol/L (ref 22–32)
Calcium: 8 mg/dL — ABNORMAL LOW (ref 8.9–10.3)
Chloride: 106 mmol/L (ref 98–111)
Creatinine, Ser: 0.44 mg/dL (ref 0.44–1.00)
GFR calc Af Amer: >60 mL/min (ref >60)
GFR calc non Af Amer: >60 mL/min (ref >60)
Glucose, Bld: 149 mg/dL — ABNORMAL HIGH (ref 70–99)
Potassium: 2.5 mmol/L — CL (ref 3.5–5.1)
Sodium: 140 mmol/L (ref 135–145)
Total Protein: 7.1 g/dL (ref 6.5–8.1)

## 2018-05-23 LAB — CBC WITH DIFFERENTIAL/PLATELET
ABS IMMATURE GRANULOCYTES: 0.04 10*3/uL (ref 0.00–0.07)
BASOS PCT: 0 %
Basophils Absolute: 0 10*3/uL (ref 0.0–0.1)
Eosinophils Absolute: 0 10*3/uL (ref 0.0–0.5)
Eosinophils Relative: 0 %
HCT: 44.8 % (ref 36.0–46.0)
Hemoglobin: 15.4 g/dL — ABNORMAL HIGH (ref 12.0–15.0)
Immature Granulocytes: 1 %
Lymphocytes Relative: 14 %
Lymphs Abs: 1.3 10*3/uL (ref 0.7–4.0)
MCH: 28.2 pg (ref 26.0–34.0)
MCHC: 34.4 g/dL (ref 30.0–36.0)
MCV: 81.9 fL (ref 80.0–100.0)
MONOS PCT: 6 %
Monocytes Absolute: 0.5 10*3/uL (ref 0.1–1.0)
Neutro Abs: 7 10*3/uL (ref 1.7–7.7)
Neutrophils Relative %: 79 %
Platelets: 182 10*3/uL (ref 150–400)
RBC: 5.47 MIL/uL — ABNORMAL HIGH (ref 3.87–5.11)
RDW: 13.8 % (ref 11.5–15.5)
WBC: 8.9 10*3/uL (ref 4.0–10.5)
nRBC: 0 % (ref 0.0–0.2)

## 2018-05-23 LAB — INFLUENZA PANEL BY PCR (TYPE A & B)
Influenza A By PCR: POSITIVE — AB
Influenza B By PCR: NEGATIVE

## 2018-05-23 LAB — BRAIN NATRIURETIC PEPTIDE: B Natriuretic Peptide: 68 pg/mL (ref 0.0–100.0)

## 2018-05-23 LAB — MAGNESIUM: MAGNESIUM: 1.9 mg/dL (ref 1.7–2.4)

## 2018-05-23 LAB — TROPONIN I: Troponin I: <0.03 ng/mL (ref <0.03)

## 2018-05-23 LAB — PROCALCITONIN: Procalcitonin: <0.1 ng/mL

## 2018-05-23 LAB — MRSA PCR SCREENING: MRSA by PCR: NEGATIVE

## 2018-05-23 LAB — LACTIC ACID, PLASMA: Lactic Acid, Venous: 1.1 mmol/L (ref 0.5–1.9)

## 2018-05-23 MED ORDER — METOPROLOL TARTRATE 50 MG PO TABS
50.0000 mg | ORAL_TABLET | Freq: Two times a day (BID) | ORAL | Status: DC
  Administered 2018-05-23 – 2018-06-05 (×22): 50 mg via ORAL
  Filled 2018-05-23 (×26): qty 1

## 2018-05-23 MED ORDER — FUROSEMIDE 40 MG PO TABS
20.0000 mg | ORAL_TABLET | Freq: Every day | ORAL | Status: DC
  Administered 2018-05-24 – 2018-05-25 (×2): 20 mg via ORAL
  Filled 2018-05-23 (×2): qty 1

## 2018-05-23 MED ORDER — LEVETIRACETAM 500 MG PO TABS
1000.0000 mg | ORAL_TABLET | Freq: Two times a day (BID) | ORAL | Status: DC
  Administered 2018-05-23 – 2018-05-24 (×2): 1000 mg via ORAL
  Filled 2018-05-23 (×2): qty 2

## 2018-05-23 MED ORDER — ENOXAPARIN SODIUM 40 MG/0.4ML ~~LOC~~ SOLN
40.0000 mg | SUBCUTANEOUS | Status: DC
  Administered 2018-05-23 – 2018-06-04 (×13): 40 mg via SUBCUTANEOUS
  Filled 2018-05-23 (×13): qty 0.4

## 2018-05-23 MED ORDER — VANCOMYCIN HCL 10 G IV SOLR
1500.0000 mg | Freq: Once | INTRAVENOUS | Status: AC
  Administered 2018-05-23: 1500 mg via INTRAVENOUS
  Filled 2018-05-23 (×2): qty 1500

## 2018-05-23 MED ORDER — SODIUM CHLORIDE 0.9 % IV SOLN
1.0000 g | Freq: Once | INTRAVENOUS | Status: AC
  Administered 2018-05-23: 1 g via INTRAVENOUS
  Filled 2018-05-23: qty 1

## 2018-05-23 MED ORDER — POTASSIUM CHLORIDE IN NACL 20-0.9 MEQ/L-% IV SOLN
Freq: Once | INTRAVENOUS | Status: DC
  Administered 2018-05-23: 22:00:00 via INTRAVENOUS

## 2018-05-23 MED ORDER — ALBUTEROL SULFATE (2.5 MG/3ML) 0.083% IN NEBU: 2.5000 mg | INHALATION_SOLUTION | RESPIRATORY_TRACT | Status: DC | PRN

## 2018-05-23 MED ORDER — IPRATROPIUM-ALBUTEROL 0.5-2.5 (3) MG/3ML IN SOLN
3.0000 mL | Freq: Four times a day (QID) | RESPIRATORY_TRACT | Status: DC
  Administered 2018-05-23 – 2018-05-26 (×12): 3 mL via RESPIRATORY_TRACT
  Filled 2018-05-23 (×12): qty 3

## 2018-05-23 MED ORDER — LISINOPRIL 10 MG PO TABS
40.0000 mg | ORAL_TABLET | Freq: Every day | ORAL | Status: DC
  Administered 2018-05-24 – 2018-05-29 (×5): 40 mg via ORAL
  Filled 2018-05-23 (×7): qty 4

## 2018-05-23 MED ORDER — POTASSIUM CHLORIDE 10 MEQ/100ML IV SOLN
10.0000 meq | INTRAVENOUS | Status: AC
  Administered 2018-05-23 – 2018-05-24 (×4): 10 meq via INTRAVENOUS
  Filled 2018-05-23 (×4): qty 100

## 2018-05-23 MED ORDER — POTASSIUM CHLORIDE IN NACL 40-0.9 MEQ/L-% IV SOLN
INTRAVENOUS | Status: DC
  Administered 2018-05-23 – 2018-05-25 (×4): 75 mL/h via INTRAVENOUS

## 2018-05-23 MED ORDER — SODIUM CHLORIDE 0.9 % IV SOLN
1.0000 g | Freq: Three times a day (TID) | INTRAVENOUS | Status: AC
  Administered 2018-05-24 – 2018-05-31 (×24): 1 g via INTRAVENOUS
  Filled 2018-05-23 (×25): qty 1

## 2018-05-23 NOTE — Progress Notes (Signed)
Pharmacy Note:  Initial antibiotic(s) regimen of Vancomycin ordered by EDP to treat PNA.  CrCl cannot be calculated (Patient's most recent lab result is older than the maximum 21 days allowed.).   No Known Allergies  Vitals:   05/23/18 2000 05/23/18 2030  BP: 113/84 105/76  Pulse: (!) 109 (!) 109  Resp: (!) 25 (!) 21  Temp:    SpO2: 90% 91%    Anti-infectives (From admission, onward)   Start     Dose/Rate Route Frequency Ordered Stop   05/23/18 2045  ceFEPIme (MAXIPIME) 1 g in sodium chloride 0.9 % 100 mL IVPB     1 g 200 mL/hr over 30 Minutes Intravenous  Once 05/23/18 2030     05/23/18 2045  vancomycin (VANCOCIN) 1,500 mg in sodium chloride 0.9 % 500 mL IVPB     1,500 mg 250 mL/hr over 120 Minutes Intravenous  Once 05/23/18 2043        Antimicrobials this admission:  Vanc 12/19 >>  Cefepime 12/19 >>   Dose adjustments this admission:  n/a  Microbiology results:   BCx:   UCx:    Sputum:    MRSA PCR:    Plan: Initial dose(s) of Vancomycin and Cefepime  X 1 ordered. F/U admission orders for further dosing if therapy continued.  Mady GemmaHayes, Cedric Denison R, [facility]PH 05/23/2018 8:43 PM

## 2018-05-23 NOTE — ED Triage Notes (Signed)
Pt [facility] c/o SOB. Pt arrived on 15L Non-Rebreather with 94% O2 Sat. Pt diagnosed earlier today with pneumonia and given 2g Rocephin and 80mg  Solu-Medrol at SNF prior to arrival.

## 2018-05-23 NOTE — ED Notes (Signed)
Date and time results received: 05/23/18 2045   Test: Potassium Critical Value: 2.5  Name of Provider Notified: Jeraldine Loots, MD

## 2018-05-23 NOTE — H&P (Signed)
History and Physical    [name] BDZ:[id number] DOB: 1961/03/10 DOA: 05/23/2018  PCP: Rosita Fire, MD  Patient coming from: SNF  Chief Complaint: Shortness of breath  HPI: Tami Nolan is a 57 y.o. female with medical history significant of bipolar disorder, CHF, dementia, MS, hypertension comes in with shortness of breath for an unknown period of time at the nursing home.  All history is obtained from emergency room staff and records from nursing home which is sparse.  Patient cannot provide any reliable history secondary to her dementia.  On arrival to the emergency department patient was short of breath and hypoxic with O2 sats of 80% on room air.  She does not require oxygen at the nursing home.  She can answer simple yes and no questions.  She is not oriented to place or time.  She denies any pain.  She is comfortable on nonrebreather at this time.  She does report she is been coughing a lot.   Review of Systems: As per HPI otherwise 10 point review of systems negative but unreliable secondary to dementia  Past Medical History:  Diagnosis Date  . Anxiety   . Bipolar disorder ([place])   . CHF (congestive heart failure) ([place])   . Colon polyps 1997  . Diabetes mellitus without complication ([place])   . ETOH abuse   . Genital warts   . Hepatitis    ETOH related   . History of electroencephalogram 08/2012   normal  . Hx of abnormal cervical Pap smear 1997  . Hypertension   . Hypokalemia   . Major depressive disorder   . Multiple sclerosis ([place])   . Rhabdomyolysis   . Stroke ([place])   . Vascular dementia [facility])     Past Surgical History:  Procedure Laterality Date  . bilatetral tubal ligation       reports that she has been smoking cigarettes. She has a 15.00 pack-year smoking history. She uses smokeless tobacco. She reports that she does not drink alcohol or use drugs.  No Known Allergies  Family History  Problem Relation Age of Onset  . Coronary artery disease  Brother   . Diabetes Brother   . Hypertension Brother   . Hypertension Mother   . Diabetes Mother   . Hypertension Sister   . Alcohol abuse Other        family history     Prior to Admission medications   Medication Sig Start Date End Date Taking? Authorizing Provider  aspirin 325 MG EC tablet Take 325 mg by mouth daily.    [provider]  cyclobenzaprine (FLEXERIL) 10 MG tablet Take 1 tablet (10 mg total) by mouth 2 (two) times daily. 08/02/12   Noemi Chapel, MD  Dimethyl Fumarate (TECFIDERA) 240 MG CPDR Take by mouth 2 (two) times daily.    [provider]  furosemide (LASIX) 20 MG tablet Take 20 mg by mouth daily.    [provider]  ketoconazole (NIZORAL) 2 % cream Apply 1 application topically daily. As directed.    [provider]  levETIRAcetam (KEPPRA) 500 MG tablet Take 2 tablets (1,000 mg total) by mouth 2 (two) times daily. 07/10/13   Rosita Fire, MD  Linaclotide (LINZESS) 145 MCG CAPS capsule Take 1 capsule (145 mcg total) by mouth daily. 03/26/14   Mahala Menghini, PA-C  lisinopril (PRINIVIL,ZESTRIL) 20 MG tablet Take 40 mg by mouth daily.     [provider]  loratadine (CLARITIN) 10 MG tablet Take  1 tablet (10 mg total) by mouth daily. 08/02/12   Noemi Chapel, MD  LORazepam (ATIVAN) 2 MG tablet Take 2 mg by mouth every 4 (four) hours as needed for anxiety.    [provider]  metoprolol tartrate (LOPRESSOR) 25 MG tablet Take 50 mg by mouth 2 (two) times daily. 08/02/12   Noemi Chapel, MD  montelukast (SINGULAIR) 10 MG tablet Take 1 tablet (10 mg total) by mouth at bedtime. 08/02/12   Noemi Chapel, MD  Multiple Vitamin (MULTIVITAMIN WITH MINERALS) TABS Take 1 tablet by mouth daily. 08/02/12   Noemi Chapel, MD  naproxen (NAPROSYN) 500 MG tablet Take 500 mg by mouth 2 (two) times daily with a meal.    [provider]  OLANZapine (ZYPREXA) 7.5 MG tablet Take 7.5 mg by mouth at bedtime.    [provider]    peg 3350 powder (MOVIPREP) 100 G SOLR Take 1 kit (200 g total) by mouth as directed. Patient not taking: Reported on 08/23/2014 04/15/14   Daneil Dolin, MD  sennosides-docusate sodium (SENOKOT-S) 8.6-50 MG tablet Take 1 tablet by mouth daily. As needed    [provider]  simvastatin (ZOCOR) 40 MG tablet Take 1 tablet (40 mg total) by mouth at bedtime. 08/02/12   Noemi Chapel, MD  sodium phosphate (FLEET) 7-19 GM/118ML ENEM Place 133 mLs (1 enema total) rectally once. Patient not taking: Reported on 08/23/2014 04/15/14   Daneil Dolin, MD  thiamine 100 MG tablet Take 1 tablet (100 mg total) by mouth daily. 07/10/13   Rosita Fire, MD  traZODone (DESYREL) 100 MG tablet Take 200 mg by mouth at bedtime. 08/02/12   Noemi Chapel, MD  venlafaxine XR (EFFEXOR-XR) 150 MG 24 hr capsule Take 1 capsule (150 mg total) by mouth daily. 08/02/12   Noemi Chapel, MD  venlafaxine XR (EFFEXOR-XR) 75 MG 24 hr capsule Take 75 mg by mouth daily. Taken with '150mg'$  capsule    [provider]    Physical Exam: Vitals:   05/23/18 1950 05/23/18 1952 05/23/18 2000 05/23/18 2030  BP: 104/80  113/84 105/76  Pulse: (!) 117  (!) 109 (!) 109  Resp: (!) 28  (!) 25 (!) 21  Temp: (!) 101.9 F (38.8 C)     TempSrc: Oral     SpO2: (!) 85% 90% 90% 91%  Weight:          Constitutional: NAD, calm, comfortable Vitals:   05/23/18 1950 05/23/18 1952 05/23/18 2000 05/23/18 2030  BP: 104/80  113/84 105/76  Pulse: (!) 117  (!) 109 (!) 109  Resp: (!) 28  (!) 25 (!) 21  Temp: (!) 101.9 F (38.8 C)     TempSrc: Oral     SpO2: (!) 85% 90% 90% 91%  Weight:       Eyes: PERRL, lids and conjunctivae normal ENMT: Mucous membranes are moist. Posterior pharynx clear of any exudate or lesions.Normal dentition.  Neck: normal, supple, no masses, no thyromegaly Respiratory: Coarse breath sounds with rhonchi bilaterally no wheezes or rails normal respiratory effort no use of sensory muscle  cardiovascular: Regular  rate and rhythm, no murmurs / rubs / gallops. No extremity edema. 2+ pedal pulses. No carotid bruits.  Abdomen: no tenderness, no masses palpated. No hepatosplenomegaly. Bowel sounds positive.  Musculoskeletal: no clubbing / cyanosis. No joint deformity upper and lower extremities. Good ROM, no contractures. Normal muscle tone.  Skin: no rashes, lesions, ulcers. No induration Neurologic: CN 2-12 grossly intact. Sensation intact, DTR normal. Strength  5/5 in all 4.  Psychiatric:  Alert and oriented x 1. Normal mood.    Labs on Admission: I have personally reviewed following labs and imaging studies  CBC: Recent Labs  Lab 05/23/18 1953  WBC 8.9  NEUTROABS 7.0  HGB 15.4*  HCT 44.8  MCV 81.9  PLT 416   Basic Metabolic Panel: Recent Labs  Lab 05/23/18 1953  NA 140  K 2.5*  CL 106  CO2 26  GLUCOSE 149*  BUN 9  CREATININE 0.44  CALCIUM 8.0*   GFR: CrCl cannot be calculated (Unknown ideal weight.). Liver Function Tests: Recent Labs  Lab 05/23/18 1953  AST 27  ALT 20  ALKPHOS 48  BILITOT 0.6  PROT 7.1  ALBUMIN 2.9*   No results for input(s): LIPASE, AMYLASE in the last 168 hours. No results for input(s): AMMONIA in the last 168 hours. Coagulation Profile: No results for input(s): INR, PROTIME in the last 168 hours. Cardiac Enzymes: Recent Labs  Lab 05/23/18 1953  TROPONINI <0.03   BNP (last 3 results) No results for input(s): PROBNP in the last 8760 hours. HbA1C: No results for input(s): HGBA1C in the last 72 hours. CBG: No results for input(s): GLUCAP in the last 168 hours. Lipid Profile: No results for input(s): CHOL, HDL, LDLCALC, TRIG, CHOLHDL, LDLDIRECT in the last 72 hours. Thyroid Function Tests: No results for input(s): TSH, T4TOTAL, FREET4, T3FREE, THYROIDAB in the last 72 hours. Anemia Panel: No results for input(s): VITAMINB12, FOLATE, FERRITIN, TIBC, IRON, RETICCTPCT in the last 72 hours. Urine analysis:    Component Value Date/Time    COLORURINE YELLOW 08/23/2014 0830   APPEARANCEUR HAZY (A) 08/23/2014 0830   LABSPEC 1.025 08/23/2014 0830   PHURINE 6.0 08/23/2014 0830   GLUCOSEU NEGATIVE 08/23/2014 0830   HGBUR TRACE (A) 08/23/2014 0830   BILIRUBINUR NEGATIVE 08/23/2014 0830   KETONESUR NEGATIVE 08/23/2014 0830   PROTEINUR NEGATIVE 08/23/2014 0830   UROBILINOGEN 0.2 08/23/2014 0830   NITRITE NEGATIVE 08/23/2014 0830   LEUKOCYTESUR NEGATIVE 08/23/2014 0830   Sepsis Labs: !!!!!!!!!!!!!!!!!!!!!!!!!!!!!!!!!!!!!!!!!!!! '@LABRCNTIP'$ (procalcitonin:4,lacticidven:4) )No results found for this or any previous visit (from the past 240 hour(s)).   Radiological Exams on Admission: Dg Chest 2 View  Result Date: 05/23/2018 CLINICAL DATA:  Worsening shortness of breath. EXAM: CHEST - 2 VIEW COMPARISON:  Chest radiograph 12/31/2016 FINDINGS: Monitoring leads overlie the patient. Cardiac contours largely obscured. Diffuse bilateral patchy consolidation within the mid lower lungs bilaterally. Moderate layering bilateral pleural effusions. IMPRESSION: Bilateral mid and lower lung patchy consolidation with associated effusions. Findings may represent pulmonary edema or multifocal infection. Electronically Signed   By: Lovey Newcomer M.D.   On: 05/23/2018 20:43    EKG: Independently reviewed.  Sinus tachycardia rate 110 with no acute changes Old chart reviewed Case cussed with Dr. Vanita Panda in the ED Chest x-ray reviewed with bilateral patchy multilobular consolidation  Assessment/Plan 57 year old female with acute hypoxic respiratory failure secondary to bilateral healthcare associated pneumonia and Sirs Principal Problem:   HCAP (healthcare-associated pneumonia)-lactic acid level is normal.  Check procalcitonin level.  Placed on vancomycin and cefepime.  Placed on pneumonia pathway.  Obtain sputum and blood cultures.  Serial lactic acid level for tonight.  With acute hypoxia with significant new oxygen demands at 3 L nasal cannula O2 sats  still 90% on 3 L.  Continue supplemental oxygen as needed.  Active Problems:   Hypokalemia-placed on KCl 40 mEq tonight.  Repeat in the morning.  Check a magnesium level.    SIRS (systemic inflammatory  response syndrome) (HCC)-treatment as above    History of multiple sclerosis-stable    Seizure disorder (HCC)-placed on seizure precautions and continue her Keppra   Further recommendations pending over hospital course and her response to antibiotics as above  DVT prophylaxis: Lovenox Code Status: Full Family Communication: None Disposition Plan: Days Consults called: None Admission status: Admission   Myleka Moncure A MD [facility]  If 7PM-7AM, please contact night-coverage [email or web address] Password [facility]  05/23/2018, 9:42 PM

## 2018-05-23 NOTE — ED Provider Notes (Signed)
[facility] EMERGENCY DEPARTMENT Provider Note   CSN: [id number] Arrival date & time: 05/23/18  1938     History   Chief Complaint Chief Complaint  Patient presents with  . Shortness of Breath    HPI Nolan Nolan is a 57 y.o. female.  HPI Patient with multiple medical issues including bipolar disorder, CHF, anxiety presents from nursing facility with concern for dyspnea. Patient answers questions only with brief nods, brief responses, denies pain, denies fever, denies cough. Reported the patient was hypoxic, with saturation of 80% on room air, improved with supplemental oxygen, per EMS. Patient was reportedly febrile. Patient not very forthcoming with details, unclear if this is due to discomfort, psychiatric disorder, or acuity of condition, level 5 caveat. Past Medical History:  Diagnosis Date  . Anxiety   . Bipolar disorder ([place])   . CHF (congestive heart failure) ([place])   . Colon polyps 1997  . Diabetes mellitus without complication ([place])   . ETOH abuse   . Genital warts   . Hepatitis    ETOH related   . History of electroencephalogram 08/2012   normal  . Hx of abnormal cervical Pap smear 1997  . Hypertension   . Hypokalemia   . Major depressive disorder   . Multiple sclerosis ([place])   . Rhabdomyolysis   . Stroke ([place])   . Vascular dementia [facility])     Patient Active Problem List   Diagnosis Date Noted  . Constipation 03/26/2014  . Encounter for screening colonoscopy 03/26/2014  . Acute encephalopathy 07/06/2013  . History of multiple sclerosis 07/06/2013  . Seizure disorder ([place]) 07/06/2013  . Sinus tachycardia 07/06/2013  . Nicotine abuse 04/06/2012  . Stroke ([place]) 04/05/2012  . Rhabdomyolysis 04/05/2012  . Hypokalemia 04/05/2012    Past Surgical History:  Procedure Laterality Date  . bilatetral tubal ligation       OB History   No obstetric history on file.      Home Medications    Prior to Admission medications   Medication Sig Start  Date End Date Taking? Authorizing Provider  aspirin 325 MG EC tablet Take 325 mg by mouth daily.    [provider]  cyclobenzaprine (FLEXERIL) 10 MG tablet Take 1 tablet (10 mg total) by mouth 2 (two) times daily. 08/02/12   Noemi Chapel, MD  Dimethyl Fumarate (TECFIDERA) 240 MG CPDR Take by mouth 2 (two) times daily.    [provider]  furosemide (LASIX) 20 MG tablet Take 20 mg by mouth daily.    [provider]  ketoconazole (NIZORAL) 2 % cream Apply 1 application topically daily. As directed.    [provider]  levETIRAcetam (KEPPRA) 500 MG tablet Take 2 tablets (1,000 mg total) by mouth 2 (two) times daily. 07/10/13   Rosita Fire, MD  Linaclotide (LINZESS) 145 MCG CAPS capsule Take 1 capsule (145 mcg total) by mouth daily. 03/26/14   Mahala Menghini, PA-C  lisinopril (PRINIVIL,ZESTRIL) 20 MG tablet Take 40 mg by mouth daily.     [provider]  loratadine (CLARITIN) 10 MG tablet Take 1 tablet (10 mg total) by mouth daily. 08/02/12   Noemi Chapel, MD  LORazepam (ATIVAN) 2 MG tablet Take 2 mg by mouth every 4 (four) hours as needed for anxiety.    [provider]  metoprolol tartrate (LOPRESSOR) 25 MG tablet Take 50 mg by mouth 2 (two) times daily. 08/02/12   Noemi Chapel, MD  montelukast (SINGULAIR) 10 MG tablet Take 1 tablet (10  mg total) by mouth at bedtime. 08/02/12   Noemi Chapel, MD  Multiple Vitamin (MULTIVITAMIN WITH MINERALS) TABS Take 1 tablet by mouth daily. 08/02/12   Noemi Chapel, MD  naproxen (NAPROSYN) 500 MG tablet Take 500 mg by mouth 2 (two) times daily with a meal.    [provider]  OLANZapine (ZYPREXA) 7.5 MG tablet Take 7.5 mg by mouth at bedtime.    [provider]  peg 3350 powder (MOVIPREP) 100 G SOLR Take 1 kit (200 g total) by mouth as directed. Patient not taking: Reported on 08/23/2014 04/15/14   Daneil Dolin, MD  sennosides-docusate sodium (SENOKOT-S) 8.6-50 MG tablet Take 1 tablet by  mouth daily. As needed    [provider]  simvastatin (ZOCOR) 40 MG tablet Take 1 tablet (40 mg total) by mouth at bedtime. 08/02/12   Noemi Chapel, MD  sodium phosphate (FLEET) 7-19 GM/118ML ENEM Place 133 mLs (1 enema total) rectally once. Patient not taking: Reported on 08/23/2014 04/15/14   Daneil Dolin, MD  thiamine 100 MG tablet Take 1 tablet (100 mg total) by mouth daily. 07/10/13   Rosita Fire, MD  traZODone (DESYREL) 100 MG tablet Take 200 mg by mouth at bedtime. 08/02/12   Noemi Chapel, MD  venlafaxine XR (EFFEXOR-XR) 150 MG 24 hr capsule Take 1 capsule (150 mg total) by mouth daily. 08/02/12   Noemi Chapel, MD  venlafaxine XR (EFFEXOR-XR) 75 MG 24 hr capsule Take 75 mg by mouth daily. Taken with 146m capsule    [provider]    Family History Family History  Problem Relation Age of Onset  . Coronary artery disease Brother   . Diabetes Brother   . Hypertension Brother   . Hypertension Mother   . Diabetes Mother   . Hypertension Sister   . Alcohol abuse Other        family history     Social History Social History   Tobacco Use  . Smoking status: Current Every Day Smoker    Packs/day: 1.00    Years: 15.00    Pack years: 15.00    Types: Cigarettes  . Smokeless tobacco: Current User  Substance Use Topics  . Alcohol use: No    Comment: Hx of ETOH abuse - dry for a couple of years.   . Drug use: No    Comment: No hx of illicit drugs      Allergies   Patient has no known allergies.   Review of Systems Review of Systems  Unable to perform ROS: Acuity of condition     Physical Exam Updated Vital Signs BP 105/76   Pulse (!) 109   Temp (!) 101.9 F (38.8 C) (Oral)   Resp (!) 21   Wt 97.1 kg   SpO2 91%   BMI 39.14 kg/m   Physical Exam Vitals signs and nursing note reviewed.  Constitutional:      Appearance: She is ill-appearing and diaphoretic.     Comments: Sickly appearing withdrawn frail female  HENT:     Head:  Normocephalic and atraumatic.  Eyes:     Conjunctiva/sclera: Conjunctivae normal.  Cardiovascular:     Rate and Rhythm: Regular rhythm. Tachycardia present.  Pulmonary:     Effort: Tachypnea and respiratory distress present.     Breath sounds: No stridor. Decreased breath sounds and wheezing present.  Abdominal:     General: There is no distension.  Skin:    General: Skin is warm.  Neurological:  Cranial Nerves: No cranial nerve deficit.     Motor: Atrophy present.  Psychiatric:        Behavior: Behavior is slowed and withdrawn.        Cognition and Memory: Cognition is impaired.      ED Treatments / Results  Labs (all labs ordered are listed, but only abnormal results are displayed) Labs Reviewed  COMPREHENSIVE METABOLIC PANEL - Abnormal; Notable for the following components:      Result Value   Potassium 2.5 (*)    Glucose, Bld 149 (*)    Calcium 8.0 (*)    Albumin 2.9 (*)    All other components within normal limits  CBC WITH DIFFERENTIAL/PLATELET - Abnormal; Notable for the following components:   RBC 5.47 (*)    Hemoglobin 15.4 (*)    All other components within normal limits  TROPONIN I  BRAIN NATRIURETIC PEPTIDE    EKG EKG Interpretation  Date/Time:  Thursday May 23 2018 19:46:03 EST Ventricular Rate:  110 PR Interval:    QRS Duration: 76 QT Interval:  303 QTC Calculation: 410 R Axis:   -5 Text Interpretation:  Sinus tachycardia Anteroseptal infarct, age indeterminate Abnormal ekg Confirmed by Carmin Muskrat [phone]) on 05/23/2018 8:19:20 PM   Radiology Dg Chest 2 View  Result Date: 05/23/2018 CLINICAL DATA:  Worsening shortness of breath. EXAM: CHEST - 2 VIEW COMPARISON:  Chest radiograph 12/31/2016 FINDINGS: Monitoring leads overlie the patient. Cardiac contours largely obscured. Diffuse bilateral patchy consolidation within the mid lower lungs bilaterally. Moderate layering bilateral pleural effusions. IMPRESSION: Bilateral mid and lower  lung patchy consolidation with associated effusions. Findings may represent pulmonary edema or multifocal infection. Electronically Signed   By: Lovey Newcomer M.D.   On: 05/23/2018 20:43    Procedures Procedures (including critical care time)  Medications Ordered in ED Medications  ceFEPIme (MAXIPIME) 1 g in sodium chloride 0.9 % 100 mL IVPB (1 g Intravenous New Bag/Given 05/23/18 2040)  vancomycin (VANCOCIN) 1,500 mg in sodium chloride 0.9 % 500 mL IVPB (has no administration in time range)  0.9 % NaCl with KCl 20 mEq/ L  infusion (has no administration in time range)     Initial Impression / Assessment and Plan / ED Course  I have reviewed the triage vital signs and the nursing notes.  Pertinent labs & imaging results that were available during my care of the patient were reviewed by me and considered in my medical decision making (see chart for details).    With concern for pneumonia given the patient's fever, tachycardia, hypoxia, she received broad-spectrum antibiotics, vancomycin, cefepime.  8:50 PM On repeat exam the patient is in similar condition peer Oxygen has improvement of the 90% with supplemental oxygen. Remaining labs notable for hypokalemia, and she is receiving supplementation. She remains normotensive, though tachycardic and tachypneic. With concern for healthcare acquired pneumonia hypoxia, and SIRS, the patient will require admission for further evaluation, management.   Final Clinical Impressions(s) / ED Diagnoses  SIRS Healthcare acquired pneumonia Hypoxia   CRITICAL CARE Performed by: Carmin Muskrat Total critical care time: 40 minutes Critical care time was exclusive of separately billable procedures and treating other patients. Critical care was necessary to treat or prevent imminent or life-threatening deterioration. Critical care was time spent personally by me on the following activities: development of treatment plan with patient and/or surrogate  as well as nursing, discussions with consultants, evaluation of patient's response to treatment, examination of patient, obtaining history from patient or surrogate, ordering and  performing treatments and interventions, ordering and review of laboratory studies, ordering and review of radiographic studies, pulse oximetry and re-evaluation of patient's condition.    Carmin Muskrat, MD 05/23/18 2051

## 2018-05-24 ENCOUNTER — Other Ambulatory Visit: Payer: Self-pay

## 2018-05-24 DIAGNOSIS — J101 Influenza due to other identified influenza virus with other respiratory manifestations: Secondary | ICD-10-CM | POA: Diagnosis present

## 2018-05-24 DIAGNOSIS — R0902 Hypoxemia: Secondary | ICD-10-CM

## 2018-05-24 DIAGNOSIS — J9601 Acute respiratory failure with hypoxia: Secondary | ICD-10-CM | POA: Diagnosis present

## 2018-05-24 LAB — COMPREHENSIVE METABOLIC PANEL
ALT: 17 U/L (ref 0–44)
AST: 23 U/L (ref 15–41)
Albumin: 2.6 g/dL — ABNORMAL LOW (ref 3.5–5.0)
Alkaline Phosphatase: 43 U/L (ref 38–126)
Anion gap: 9 (ref 5–15)
BUN: 9 mg/dL (ref 6–20)
CO2: 22 mmol/L (ref 22–32)
CREATININE: 0.37 mg/dL — AB (ref 0.44–1.00)
Calcium: 7.5 mg/dL — ABNORMAL LOW (ref 8.9–10.3)
Chloride: 108 mmol/L (ref 98–111)
GFR calc Af Amer: >60 mL/min (ref >60)
GFR calc non Af Amer: >60 mL/min (ref >60)
GLUCOSE: 232 mg/dL — AB (ref 70–99)
Potassium: 3 mmol/L — ABNORMAL LOW (ref 3.5–5.1)
Sodium: 139 mmol/L (ref 135–145)
Total Bilirubin: 0.8 mg/dL (ref 0.3–1.2)
Total Protein: 6.4 g/dL — ABNORMAL LOW (ref 6.5–8.1)

## 2018-05-24 LAB — CBC WITH DIFFERENTIAL/PLATELET
Abs Immature Granulocytes: 0.03 10*3/uL (ref 0.00–0.07)
Basophils Absolute: 0 10*3/uL (ref 0.0–0.1)
Basophils Relative: 1 %
Eosinophils Absolute: 0.1 10*3/uL (ref 0.0–0.5)
Eosinophils Relative: 1 %
HCT: 40.3 % (ref 36.0–46.0)
HEMOGLOBIN: 13.6 g/dL (ref 12.0–15.0)
Immature Granulocytes: 0 %
Lymphocytes Relative: 13 %
Lymphs Abs: 1 10*3/uL (ref 0.7–4.0)
MCH: 27.6 pg (ref 26.0–34.0)
MCHC: 33.7 g/dL (ref 30.0–36.0)
MCV: 81.7 fL (ref 80.0–100.0)
MONO ABS: 0.1 10*3/uL (ref 0.1–1.0)
MONOS PCT: 1 %
Neutro Abs: 6.4 10*3/uL (ref 1.7–7.7)
Neutrophils Relative %: 84 %
Platelets: 165 10*3/uL (ref 150–400)
RBC: 4.93 MIL/uL (ref 3.87–5.11)
RDW: 14.2 % (ref 11.5–15.5)
WBC: 7.8 10*3/uL (ref 4.0–10.5)
nRBC: 0 % (ref 0.0–0.2)

## 2018-05-24 LAB — GLUCOSE, CAPILLARY: Glucose-Capillary: 198 mg/dL — ABNORMAL HIGH (ref 70–99)

## 2018-05-24 LAB — STREP PNEUMONIAE URINARY ANTIGEN: Strep Pneumo Urinary Antigen: NEGATIVE

## 2018-05-24 LAB — LACTIC ACID, PLASMA: Lactic Acid, Venous: 0.8 mmol/L (ref 0.5–1.9)

## 2018-05-24 LAB — PROCALCITONIN: Procalcitonin: <0.1 ng/mL

## 2018-05-24 MED ORDER — LEVETIRACETAM 100 MG/ML PO SOLN
1500.0000 mg | Freq: Two times a day (BID) | ORAL | Status: DC
  Administered 2018-05-24 – 2018-06-05 (×24): 1500 mg via ORAL
  Filled 2018-05-24 (×28): qty 15

## 2018-05-24 MED ORDER — LINACLOTIDE 145 MCG PO CAPS
145.0000 ug | ORAL_CAPSULE | Freq: Every day | ORAL | Status: DC
  Administered 2018-05-24 – 2018-06-05 (×13): 145 ug via ORAL
  Filled 2018-05-24 (×13): qty 1

## 2018-05-24 MED ORDER — IBUPROFEN 400 MG PO TABS
600.0000 mg | ORAL_TABLET | Freq: Once | ORAL | Status: AC
  Administered 2018-05-24: 600 mg via ORAL
  Filled 2018-05-24: qty 2

## 2018-05-24 MED ORDER — ORAL CARE MOUTH RINSE
15.0000 mL | Freq: Two times a day (BID) | OROMUCOSAL | Status: DC
  Administered 2018-05-24 – 2018-06-05 (×25): 15 mL via OROMUCOSAL

## 2018-05-24 MED ORDER — DIVALPROEX SODIUM 125 MG PO CSDR
500.0000 mg | DELAYED_RELEASE_CAPSULE | Freq: Two times a day (BID) | ORAL | Status: DC
  Administered 2018-05-24 – 2018-06-05 (×24): 500 mg via ORAL
  Filled 2018-05-24 (×30): qty 4

## 2018-05-24 MED ORDER — POTASSIUM CHLORIDE 10 MEQ/100ML IV SOLN
10.0000 meq | INTRAVENOUS | Status: AC
  Administered 2018-05-24 – 2018-05-25 (×4): 10 meq via INTRAVENOUS
  Filled 2018-05-24 (×4): qty 100

## 2018-05-24 MED ORDER — INSULIN ASPART 100 UNIT/ML ~~LOC~~ SOLN
0.0000 [IU] | Freq: Three times a day (TID) | SUBCUTANEOUS | Status: DC
  Administered 2018-05-25: 2 [IU] via SUBCUTANEOUS
  Administered 2018-05-26 – 2018-05-31 (×6): 1 [IU] via SUBCUTANEOUS
  Administered 2018-06-01: 2 [IU] via SUBCUTANEOUS
  Administered 2018-06-01 – 2018-06-03 (×5): 1 [IU] via SUBCUTANEOUS
  Administered 2018-06-04: 2 [IU] via SUBCUTANEOUS
  Administered 2018-06-04 – 2018-06-05 (×2): 1 [IU] via SUBCUTANEOUS
  Administered 2018-06-05: 2 [IU] via SUBCUTANEOUS

## 2018-05-24 MED ORDER — ATORVASTATIN CALCIUM 40 MG PO TABS
40.0000 mg | ORAL_TABLET | Freq: Every day | ORAL | Status: DC
  Administered 2018-05-24 – 2018-06-04 (×11): 40 mg via ORAL
  Filled 2018-05-24 (×12): qty 1

## 2018-05-24 MED ORDER — MONTELUKAST SODIUM 10 MG PO TABS
10.0000 mg | ORAL_TABLET | Freq: Every day | ORAL | Status: DC
  Administered 2018-05-24 – 2018-06-04 (×12): 10 mg via ORAL
  Filled 2018-05-24 (×12): qty 1

## 2018-05-24 MED ORDER — INSULIN ASPART 100 UNIT/ML ~~LOC~~ SOLN: 0.0000 [IU] | Freq: Every day | SUBCUTANEOUS | Status: DC

## 2018-05-24 MED ORDER — TOPIRAMATE 25 MG PO TABS
50.0000 mg | ORAL_TABLET | Freq: Two times a day (BID) | ORAL | Status: DC
  Administered 2018-05-24 – 2018-06-05 (×24): 50 mg via ORAL
  Filled 2018-05-24 (×25): qty 2

## 2018-05-24 MED ORDER — OXYBUTYNIN CHLORIDE 5 MG PO TABS
5.0000 mg | ORAL_TABLET | Freq: Two times a day (BID) | ORAL | Status: DC
  Administered 2018-05-24 – 2018-06-05 (×24): 5 mg via ORAL
  Filled 2018-05-24 (×24): qty 1

## 2018-05-24 MED ORDER — OSELTAMIVIR PHOSPHATE 75 MG PO CAPS
75.0000 mg | ORAL_CAPSULE | Freq: Two times a day (BID) | ORAL | Status: AC
  Administered 2018-05-24 – 2018-05-28 (×10): 75 mg via ORAL
  Filled 2018-05-24 (×10): qty 1

## 2018-05-24 MED ORDER — SODIUM CHLORIDE 0.9 % IV SOLN
INTRAVENOUS | Status: AC
  Filled 2018-05-24: qty 1

## 2018-05-24 MED ORDER — AMLODIPINE BESYLATE 5 MG PO TABS
7.5000 mg | ORAL_TABLET | Freq: Every day | ORAL | Status: DC
  Administered 2018-05-24 – 2018-05-29 (×5): 7.5 mg via ORAL
  Filled 2018-05-24 (×9): qty 2

## 2018-05-24 MED ORDER — ACETAMINOPHEN 325 MG PO TABS
650.0000 mg | ORAL_TABLET | Freq: Four times a day (QID) | ORAL | Status: DC | PRN
  Administered 2018-05-24 – 2018-06-04 (×5): 650 mg via ORAL
  Filled 2018-05-24 (×4): qty 2

## 2018-05-24 NOTE — Clinical Social Work Note (Signed)
Pt is a 57 year old female admitted from [facility] where she resides in Long Term Care. Updated Thayer Ohm at [facility]. Plan is for return to [facility] at [employer]. Will follow up Monday to further assess and assist with dc planning.

## 2018-05-24 NOTE — Progress Notes (Signed)
Pt with ongoing fever.  Tylenol given at 1843 with temperature currently 102.1 rectal.   Stevie Kernharles Bodenheimer paged with order given for Ibuprofen, will give and continue to monitor.

## 2018-05-24 NOTE — Progress Notes (Signed)
PROGRESS NOTE    Tami Nolan  ZOX:[id number] DOB: [birth year] DOA: 05/23/2018 PCP: [name], MD    Brief Narrative:  57 year old female with a history of bipolar disorder, hypertension, multiple sclerosis, who is a resident of a skilled nursing facility, admitted to the hospital with shortness of breath, hypoxia and fevers.  Found to have healthcare associated pneumonia and influenza A.  Currently on IV antibiotics and Tamiflu.  She also has significant hypoxia and is on supplemental oxygen.   Assessment & Plan:   Principal Problem:   HCAP (healthcare-associated pneumonia) Active Problems:   Hypokalemia   History of multiple sclerosis   Seizure disorder (HCC)   SIRS (systemic inflammatory response syndrome) (HCC)   Influenza A   Acute respiratory failure with hypoxia (HCC)   1. Acute respiratory failure with hypoxia.  Related to pneumonia and influenza.  Currently on 10 L high flow oxygen.  We will try and wean down oxygen as tolerated. 2. Healthcare associated pneumonia.  Currently on cefepime.  MRSA PCR is negative, vancomycin discontinued.  Continue current treatments. 3. Influenza A.  Is currently on Tamiflu 4. Hypokalemia.  Improving with replacement.  Magnesium level normal. 5. Seizure disorder.  Continue on Keppra   DVT prophylaxis: lovenox Code Status: full code Family Communication: no family present Disposition Plan: discharge back to SNF when stable   Consultants:     Procedures:     Antimicrobials:   Cefepime 12/19>  Vancomycin 12/19>12/20   Subjective: Patient is confused. She says she has been coughing, still short of breath  Objective: Vitals:   05/24/18 1730 05/24/18 1800 05/24/18 1830 05/24/18 1844  BP: (!) 164/105 (!) 151/92 (!) 151/88   Pulse: (!) 125  (!) 135   Resp: (!) 22 19 (!) 29   Temp:    (!) 103.1 F (39.5 C)  TempSrc:    Oral  SpO2: 99%  93%   Weight:      Height:        Intake/Output Summary (Last 24 hours) at  05/24/2018 1947 Last data filed at 05/24/2018 1845 Gross per 24 hour  Intake 3041.33 ml  Output 700 ml  Net 2341.33 ml   Filed Weights   05/23/18 1945 05/23/18 2207  Weight: 97.1 kg 83.8 kg    Examination:  General exam: Appears calm and comfortable  Respiratory system: crackles at bases. Mild increased respiratory effort Cardiovascular system: S1 & S2 heard, RRR. No JVD, murmurs, rubs, gallops or clicks. No pedal edema. Gastrointestinal system: Abdomen is nondistended, soft and nontender. No organomegaly or masses felt. Normal bowel sounds heard. Central nervous system: . No focal neurological deficits. Extremities: Symmetric 5 x 5 power. Skin: No rashes, lesions or ulcers Psychiatry: confused     Data Reviewed: I have personally reviewed following labs and imaging studies  CBC: Recent Labs  Lab 05/23/18 1953 05/24/18 0122  WBC 8.9 7.8  NEUTROABS 7.0 6.4  HGB 15.4* 13.6  HCT 44.8 40.3  MCV 81.9 81.7  PLT 182 165   Basic Metabolic Panel: Recent Labs  Lab 05/23/18 1953 05/24/18 0122  NA 140 139  K 2.5* 3.0*  CL 106 108  CO2 26 22  GLUCOSE 149* 232*  BUN 9 9  CREATININE 0.44 0.37*  CALCIUM 8.0* 7.5*  MG 1.9  --    GFR: Estimated Creatinine Clearance: 85.3 mL/min (A) (by C-G formula based on SCr of 0.37 mg/dL (L)). Liver Function Tests: Recent Labs  Lab 05/23/18 1953 05/24/18 0122  AST 27 23  ALT 20 17  ALKPHOS 48 43  BILITOT 0.6 0.8  PROT 7.1 6.4*  ALBUMIN 2.9* 2.6*   No results for input(s): LIPASE, AMYLASE in the last 168 hours. No results for input(s): AMMONIA in the last 168 hours. Coagulation Profile: No results for input(s): INR, PROTIME in the last 168 hours. Cardiac Enzymes: Recent Labs  Lab 05/23/18 1953  TROPONINI <0.03   BNP (last 3 results) No results for input(s): PROBNP in the last 8760 hours. HbA1C: No results for input(s): HGBA1C in the last 72 hours. CBG: No results for input(s): GLUCAP in the last 168 hours. Lipid  Profile: No results for input(s): CHOL, HDL, LDLCALC, TRIG, CHOLHDL, LDLDIRECT in the last 72 hours. Thyroid Function Tests: No results for input(s): TSH, T4TOTAL, FREET4, T3FREE, THYROIDAB in the last 72 hours. Anemia Panel: No results for input(s): VITAMINB12, FOLATE, FERRITIN, TIBC, IRON, RETICCTPCT in the last 72 hours. Sepsis Labs: Recent Labs  Lab 05/23/18 1953 05/23/18 2212 05/24/18 0122  PROCALCITON <0.10  --  <0.10  LATICACIDVEN  --  1.1 0.8    Recent Results (from the past 240 hour(s))  MRSA PCR Screening     Status: None   Collection Time: 05/23/18  9:55 PM  Result Value Ref Range Status   MRSA by PCR NEGATIVE NEGATIVE Final    Comment:        The GeneXpert MRSA Assay (FDA approved for NASAL specimens only), is one component of a comprehensive MRSA colonization surveillance program. It is not intended to diagnose MRSA infection nor to guide or monitor treatment for MRSA infections. Performed at [facility], [address] [place], [place] [address]   Culture, blood (routine x 2) Call MD if unable to obtain prior to antibiotics being given     Status: None (Preliminary result)   Collection Time: 05/23/18 10:12 PM  Result Value Ref Range Status   Specimen Description BLOOD LEFT HAND  Final   Special Requests   Final    BOTTLES DRAWN AEROBIC AND ANAEROBIC Blood Culture adequate volume   Culture   Final    NO GROWTH < 12 HOURS Performed at [facility], [address] [place], [place] [address]    Report Status PENDING  Incomplete  Culture, blood (routine x 2) Call MD if unable to obtain prior to antibiotics being given     Status: None (Preliminary result)   Collection Time: 05/23/18 10:18 PM  Result Value Ref Range Status   Specimen Description BLOOD LEFT HAND  Final   Special Requests   Final    BOTTLES DRAWN AEROBIC AND ANAEROBIC Blood Culture adequate volume   Culture   Final    NO GROWTH < 12 HOURS Performed at [facility], [address]  [place], [place] [address]    Report Status PENDING  Incomplete         Radiology Studies: Dg Chest 2 View  Result Date: 05/23/2018 CLINICAL DATA:  Worsening shortness of breath. EXAM: CHEST - 2 VIEW COMPARISON:  Chest radiograph 12/31/2016 FINDINGS: Monitoring leads overlie the patient. Cardiac contours largely obscured. Diffuse bilateral patchy consolidation within the mid lower lungs bilaterally. Moderate layering bilateral pleural effusions. IMPRESSION: Bilateral mid and lower lung patchy consolidation with associated effusions. Findings may represent pulmonary edema or multifocal infection. Electronically Signed   By: Annia Belt M.D.   On: 05/23/2018 20:43        Scheduled Meds: . amLODipine  7.5 mg Oral Daily  . atorvastatin  40 mg Oral Daily  .  divalproex  500 mg Oral BID  . enoxaparin (LOVENOX) injection  40 mg Subcutaneous Q24H  . furosemide  20 mg Oral Daily  . insulin aspart  0-5 Units Subcutaneous QHS  . [START ON 05/25/2018] insulin aspart  0-9 Units Subcutaneous TID WC  . ipratropium-albuterol  3 mL Nebulization Q6H  . levETIRAcetam  1,500 mg Oral BID  . levETIRAcetam  1,000 mg Oral BID  . linaclotide  145 mcg Oral Daily  . lisinopril  40 mg Oral Daily  . mouth rinse  15 mL Mouth Rinse BID  . metoprolol tartrate  50 mg Oral BID  . montelukast  10 mg Oral QHS  . oseltamivir  75 mg Oral BID  . oxybutynin  5 mg Oral BID  . topiramate  50 mg Oral BID   Continuous Infusions: . 0.9 % NaCl with KCl 40 mEq / L 75 mL/hr (05/24/18 1349)  . ceFEPime (MAXIPIME) IV 1 g (05/24/18 1349)  . potassium chloride       LOS: 1 day    Time spent: 35mins    Erick BlinksJehanzeb Deeanne Deininger, MD [facility] Pager [phone]  If 7PM-7AM, please contact night-coverage [email or web address] Password [email or web address] 05/24/2018, 7:47 PM

## 2018-05-25 LAB — CBC
HCT: 39 % (ref 36.0–46.0)
Hemoglobin: 13.2 g/dL (ref 12.0–15.0)
MCH: 27.8 pg (ref 26.0–34.0)
MCHC: 33.8 g/dL (ref 30.0–36.0)
MCV: 82.1 fL (ref 80.0–100.0)
Platelets: 166 10*3/uL (ref 150–400)
RBC: 4.75 MIL/uL (ref 3.87–5.11)
RDW: 14.3 % (ref 11.5–15.5)
WBC: 8 10*3/uL (ref 4.0–10.5)
nRBC: 0 % (ref 0.0–0.2)

## 2018-05-25 LAB — GLUCOSE, CAPILLARY
Glucose-Capillary: 120 mg/dL — ABNORMAL HIGH (ref 70–99)
Glucose-Capillary: 151 mg/dL — ABNORMAL HIGH (ref 70–99)
Glucose-Capillary: 116 mg/dL — ABNORMAL HIGH (ref 70–99)
Glucose-Capillary: 107 mg/dL — ABNORMAL HIGH (ref 70–99)

## 2018-05-25 LAB — BASIC METABOLIC PANEL
ANION GAP: 5 (ref 5–15)
BUN: 6 mg/dL (ref 6–20)
CO2: 24 mmol/L (ref 22–32)
Calcium: 7.9 mg/dL — ABNORMAL LOW (ref 8.9–10.3)
Chloride: 112 mmol/L — ABNORMAL HIGH (ref 98–111)
Creatinine, Ser: 0.38 mg/dL — ABNORMAL LOW (ref 0.44–1.00)
GFR calc non Af Amer: >60 mL/min (ref >60)
Glucose, Bld: 171 mg/dL — ABNORMAL HIGH (ref 70–99)
Potassium: 3.4 mmol/L — ABNORMAL LOW (ref 3.5–5.1)
Sodium: 141 mmol/L (ref 135–145)

## 2018-05-25 LAB — PROCALCITONIN: Procalcitonin: <0.1 ng/mL

## 2018-05-25 MED ORDER — VENLAFAXINE HCL ER 75 MG PO CP24
112.5000 mg | ORAL_CAPSULE | Freq: Every day | ORAL | Status: DC
  Administered 2018-05-25 – 2018-06-05 (×12): 112.5 mg via ORAL
  Filled 2018-05-25 (×14): qty 1

## 2018-05-25 MED ORDER — FERROUS FUMARATE 324 (106 FE) MG PO TABS
1.0000 | ORAL_TABLET | Freq: Two times a day (BID) | ORAL | Status: DC
  Administered 2018-05-25 – 2018-06-05 (×23): 106 mg via ORAL
  Filled 2018-05-25 (×23): qty 1

## 2018-05-25 MED ORDER — LORAZEPAM 0.5 MG PO TABS
0.2500 mg | ORAL_TABLET | Freq: Every day | ORAL | Status: DC
  Administered 2018-05-25 – 2018-06-04 (×11): 0.25 mg via ORAL
  Filled 2018-05-25 (×11): qty 1

## 2018-05-25 MED ORDER — DIMETHYL FUMARATE 240 MG PO CPDR: 240.0000 mg | DELAYED_RELEASE_CAPSULE | Freq: Two times a day (BID) | ORAL | Status: DC

## 2018-05-25 NOTE — Discharge Summary (Signed)
PROGRESS NOTE    Tami Nolan  ZOX:[id number] DOB: 08/13/1960 DOA: 05/23/2018 PCP: Avon GullyFanta, Tesfaye, MD    Brief Narrative:  57 year old female with a history of bipolar disorder, hypertension, multiple sclerosis, who is a resident of a skilled nursing facility, admitted to the hospital with shortness of breath, hypoxia and fevers.  Found to have healthcare associated pneumonia and influenza A.  Currently on IV antibiotics and Tamiflu.  She also has significant hypoxia and is on supplemental oxygen.   Assessment & Plan:   Principal Problem:   HCAP (healthcare-associated pneumonia) Active Problems:   Hypokalemia   History of multiple sclerosis   Seizure disorder (HCC)   SIRS (systemic inflammatory response syndrome) (HCC)   Influenza A   Acute respiratory failure with hypoxia (HCC)   1. Acute respiratory failure with hypoxia.  Related to pneumonia and influenza.  Patient has increasing oxygen requirement currently on 14 L high flow nasal cannula..  We will try and wean down oxygen as tolerated. 2. Healthcare associated pneumonia.  Currently on cefepime.  MRSA PCR is negative, vancomycin discontinued.  Continue current treatments. 3. Influenza A.  Is currently on Tamiflu 4. Hypokalemia.  Improving with replacement.  Magnesium level normal. 5. Seizure disorder.  Continue on Keppra 6. Bipolar disorder.  Continue on Depakote.  Appears stable. 7. Hypertension.  Stable on amlodipine, lisinopril and metoprolol. 8. Hyperlipidemia.  Continue statin 9. Multiple sclerosis.  Resume home medications on discharge.   DVT prophylaxis: lovenox Code Status: full code Family Communication: no family present Disposition Plan: discharge back to SNF when stable   Consultants:     Procedures:     Antimicrobials:   Cefepime 12/19>  Vancomycin 12/19>12/20   Subjective: Patient has increasing oxygen requirement.  Continues to have productive cough.  Still short of  breath.  Objective: Vitals:   05/25/18 1500 05/25/18 1600 05/25/18 1606 05/25/18 1617  BP:  112/84    Pulse: 89 94 82 90  Resp: 15 17 (!) 21 (!) 23  Temp:    99.5 F (37.5 C)  TempSrc:    Oral  SpO2: (!) 89% (!) 87% (!) 85% (!) 87%  Weight:      Height:        Intake/Output Summary (Last 24 hours) at 05/25/2018 1815 Last data filed at 05/25/2018 1317 Gross per 24 hour  Intake 2592.15 ml  Output 1250 ml  Net 1342.15 ml   Filed Weights   05/23/18 1945 05/23/18 2207  Weight: 97.1 kg 83.8 kg    Examination:  General exam: Alert, awake, no distress Respiratory system: bilateral rhonchi. Respiratory effort normal. Cardiovascular system:RRR. No murmurs, rubs, gallops. Gastrointestinal system: Abdomen is nondistended, soft and nontender. No organomegaly or masses felt. Normal bowel sounds heard. Central nervous system: No focal neurological deficits. Extremities: No C/C/E, +pedal pulses Skin: No rashes, lesions or ulcers Psychiatry: pleasant, confused, engages in conversation.      Data Reviewed: I have personally reviewed following labs and imaging studies  CBC: Recent Labs  Lab 05/23/18 1953 05/24/18 0122 05/25/18 0407  WBC 8.9 7.8 8.0  NEUTROABS 7.0 6.4  --   HGB 15.4* 13.6 13.2  HCT 44.8 40.3 39.0  MCV 81.9 81.7 82.1  PLT 182 165 166   Basic Metabolic Panel: Recent Labs  Lab 05/23/18 1953 05/24/18 0122 05/25/18 0407  NA 140 139 141  K 2.5* 3.0* 3.4*  CL 106 108 112*  CO2 26 22 24   GLUCOSE 149* 232* 171*  BUN 9 9 6  CREATININE 0.44 0.37* 0.38*  CALCIUM 8.0* 7.5* 7.9*  MG 1.9  --   --    GFR: Estimated Creatinine Clearance: 85.3 mL/min (A) (by C-G formula based on SCr of 0.38 mg/dL (L)). Liver Function Tests: Recent Labs  Lab 05/23/18 1953 05/24/18 0122  AST 27 23  ALT 20 17  ALKPHOS 48 43  BILITOT 0.6 0.8  PROT 7.1 6.4*  ALBUMIN 2.9* 2.6*   No results for input(s): LIPASE, AMYLASE in the last 168 hours. No results for input(s):  AMMONIA in the last 168 hours. Coagulation Profile: No results for input(s): INR, PROTIME in the last 168 hours. Cardiac Enzymes: Recent Labs  Lab 05/23/18 1953  TROPONINI <0.03   BNP (last 3 results) No results for input(s): PROBNP in the last 8760 hours. HbA1C: No results for input(s): HGBA1C in the last 72 hours. CBG: Recent Labs  Lab 05/24/18 2118 05/25/18 0825 05/25/18 1133 05/25/18 1609  GLUCAP 198* 120* 151* 116*   Lipid Profile: No results for input(s): CHOL, HDL, LDLCALC, TRIG, CHOLHDL, LDLDIRECT in the last 72 hours. Thyroid Function Tests: No results for input(s): TSH, T4TOTAL, FREET4, T3FREE, THYROIDAB in the last 72 hours. Anemia Panel: No results for input(s): VITAMINB12, FOLATE, FERRITIN, TIBC, IRON, RETICCTPCT in the last 72 hours. Sepsis Labs: Recent Labs  Lab 05/23/18 1953 05/23/18 2212 05/24/18 0122 05/25/18 0407  PROCALCITON <0.10  --  <0.10 <0.10  LATICACIDVEN  --  1.1 0.8  --     Recent Results (from the past 240 hour(s))  MRSA PCR Screening     Status: None   Collection Time: 05/23/18  9:55 PM  Result Value Ref Range Status   MRSA by PCR NEGATIVE NEGATIVE Final    Comment:        The GeneXpert MRSA Assay (FDA approved for NASAL specimens only), is one component of a comprehensive MRSA colonization surveillance program. It is not intended to diagnose MRSA infection nor to guide or monitor treatment for MRSA infections. Performed at [facility], [address], [place], [place] [address]   Culture, blood (routine x 2) Call MD if unable to obtain prior to antibiotics being given     Status: None (Preliminary result)   Collection Time: 05/23/18 10:12 PM  Result Value Ref Range Status   Specimen Description BLOOD LEFT HAND  Final   Special Requests   Final    BOTTLES DRAWN AEROBIC AND ANAEROBIC Blood Culture adequate volume   Culture   Final    NO GROWTH 2 DAYS Performed at [facility], [address], [place], [place] [address]     Report Status PENDING  Incomplete  Culture, blood (routine x 2) Call MD if unable to obtain prior to antibiotics being given     Status: None (Preliminary result)   Collection Time: 05/23/18 10:18 PM  Result Value Ref Range Status   Specimen Description BLOOD LEFT HAND  Final   Special Requests   Final    BOTTLES DRAWN AEROBIC AND ANAEROBIC Blood Culture adequate volume   Culture   Final    NO GROWTH 2 DAYS Performed at [facility], [address], [place], [place] [address]    Report Status PENDING  Incomplete  Culture, respiratory     Status: None (Preliminary result)   Collection Time: 05/25/18  1:09 AM  Result Value Ref Range Status   Specimen Description   Final    SPUTUM Performed at [facility], [address], [place], [place] [address]  Special Requests   Final    NONE Performed at [facility], [address], [place], [place] [address]    Gram Stain   Final    RARE WBC PRESENT,BOTH PMN AND MONONUCLEAR RARE GRAM POSITIVE RODS RARE BUDDING YEAST SEEN RARE SQUAMOUS EPITHELIAL CELLS PRESENT Performed at [facility], [address], [place], [place] [address]    Culture PENDING  Incomplete   Report Status PENDING  Incomplete         Radiology Studies: Dg Chest 2 View  Result Date: 05/23/2018 CLINICAL DATA:  Worsening shortness of breath. EXAM: CHEST - 2 VIEW COMPARISON:  Chest radiograph 12/31/2016 FINDINGS: Monitoring leads overlie the patient. Cardiac contours largely obscured. Diffuse bilateral patchy consolidation within the mid lower lungs bilaterally. Moderate layering bilateral pleural effusions. IMPRESSION: Bilateral mid and lower lung patchy consolidation with associated effusions. Findings may represent pulmonary edema or multifocal infection. Electronically Signed   By: Annia Belt M.D.   On: 05/23/2018 20:43        Scheduled Meds: . amLODipine  7.5 mg Oral Daily  . atorvastatin  40 mg Oral q1800  . divalproex  500 mg Oral BID  .  enoxaparin (LOVENOX) injection  40 mg Subcutaneous Q24H  . Ferrous Fumarate  1 tablet Oral BID  . furosemide  20 mg Oral Daily  . insulin aspart  0-5 Units Subcutaneous QHS  . insulin aspart  0-9 Units Subcutaneous TID WC  . ipratropium-albuterol  3 mL Nebulization Q6H  . levETIRAcetam  1,500 mg Oral BID  . linaclotide  145 mcg Oral Daily  . lisinopril  40 mg Oral Daily  . LORazepam  0.25 mg Oral QHS  . mouth rinse  15 mL Mouth Rinse BID  . metoprolol tartrate  50 mg Oral BID  . montelukast  10 mg Oral QHS  . oseltamivir  75 mg Oral BID  . oxybutynin  5 mg Oral BID  . topiramate  50 mg Oral BID  . venlafaxine XR  112.5 mg Oral Daily   Continuous Infusions: . 0.9 % NaCl with KCl 40 mEq / L 75 mL/hr (05/25/18 1815)  . ceFEPime (MAXIPIME) IV 1 g (05/25/18 1317)     LOS: 2 days    Time spent:    Erick Blinks, MD [facility] Pager [phone]  If 7PM-7AM, please contact night-coverage [email or web address] Password [facility] 05/25/2018, 6:15 PM

## 2018-05-25 NOTE — Progress Notes (Signed)
Pt removed oxygen with sats dropping to 81%.  Pt agitated and saying get me out of here.  Pt oxygen replaced with saturation hovering in the upper 80s for several minutes.  percussion therapy initiated with ICU bed.  Pt course and rhonchi with pt refusing to cough.  Saturation returned to 94% with coughing after a few minutes of percussion.

## 2018-05-25 NOTE — Progress Notes (Addendum)
Patients oxygen saturation has been low most of day. Oxygen bottle connected improperly which is common problem. Patient also is ahead on fluids by 4383.5 by intake output. Suggest BNP blood work if needed. She has dementia from past stroke. CPT by bed is given now. Patient on 12lpm high flow has rhonchi through out . Cannot do flutter as of yet to alert for nTSX

## 2018-05-26 LAB — BASIC METABOLIC PANEL
Anion gap: 7 (ref 5–15)
BUN: 6 mg/dL (ref 6–20)
CO2: 23 mmol/L (ref 22–32)
Calcium: 8.3 mg/dL — ABNORMAL LOW (ref 8.9–10.3)
Chloride: 112 mmol/L — ABNORMAL HIGH (ref 98–111)
Creatinine, Ser: 0.36 mg/dL — ABNORMAL LOW (ref 0.44–1.00)
GFR calc Af Amer: >60 mL/min (ref >60)
GFR calc non Af Amer: >60 mL/min (ref >60)
Glucose, Bld: 121 mg/dL — ABNORMAL HIGH (ref 70–99)
POTASSIUM: 3.3 mmol/L — AB (ref 3.5–5.1)
Sodium: 142 mmol/L (ref 135–145)

## 2018-05-26 LAB — MAGNESIUM: Magnesium: 1.9 mg/dL (ref 1.7–2.4)

## 2018-05-26 LAB — GLUCOSE, CAPILLARY
GLUCOSE-CAPILLARY: 134 mg/dL — AB (ref 70–99)
Glucose-Capillary: 110 mg/dL — ABNORMAL HIGH (ref 70–99)
Glucose-Capillary: 137 mg/dL — ABNORMAL HIGH (ref 70–99)
Glucose-Capillary: 138 mg/dL — ABNORMAL HIGH (ref 70–99)

## 2018-05-26 MED ORDER — POTASSIUM CHLORIDE 10 MEQ/100ML IV SOLN
10.0000 meq | INTRAVENOUS | Status: AC
  Administered 2018-05-26 (×4): 10 meq via INTRAVENOUS
  Filled 2018-05-26 (×4): qty 100

## 2018-05-26 MED ORDER — MAGNESIUM SULFATE 2 GM/50ML IV SOLN
2.0000 g | Freq: Once | INTRAVENOUS | Status: AC
  Administered 2018-05-26: 2 g via INTRAVENOUS
  Filled 2018-05-26: qty 50

## 2018-05-26 NOTE — Progress Notes (Addendum)
PROGRESS NOTE    Tami Nolan  WUX:[id number] DOB: 01/03/1961 DOA: 05/23/2018 PCP: [name], MD    Brief Narrative:  57 year old female with a history of bipolar disorder, hypertension, multiple sclerosis, who is a resident of a skilled nursing facility, admitted to the hospital with shortness of breath, hypoxia and fevers.  Found to have healthcare associated pneumonia and influenza A.  Currently on IV antibiotics and Tamiflu.  She also has significant hypoxia and is on supplemental oxygen.  Assessment & Plan:   Principal Problem:   HCAP (healthcare-associated pneumonia) Active Problems:   Hypokalemia   History of multiple sclerosis   Seizure disorder (HCC)   SIRS (systemic inflammatory response syndrome) (HCC)   Influenza A   Acute respiratory failure with hypoxia (HCC)   1. Acute respiratory failure with hypoxia.  Only minimal improvement noted.  Secondary to pneumonia and influenza.  Patient has increasing oxygen requirement currently on 14 L high flow nasal cannula..  Wean oxygen as able.  Continue treatments.   2. Healthcare associated pneumonia.  Currently on cefepime.  MRSA PCR is negative, vancomycin discontinued.  Continue current treatments. 3. Sepsis - resolved.  4. Influenza A.  Is currently on Tamiflu. Continue supportive care.  5. Hypokalemia.  IV replacement ordered.  Improving with replacement.  Give additional magnesium.  6. Seizure disorder.  Continue on Keppra 7. Bipolar disorder.  Continue on Depakote.   8. Hypertension.  Stable on amlodipine, lisinopril and metoprolol. 9. Hyperlipidemia.  Continue statin 10. Multiple sclerosis.  Resume home medications on discharge.   DVT prophylaxis: lovenox Code Status: full code Family Communication: no family present Disposition Plan: discharge back to SNF when stable   Consultants:     Procedures:     Antimicrobials:   Cefepime 12/19>  Vancomycin 12/19>12/20   Subjective: Patient remains  short of breath and unable to get up secretions.  She denies chest pain.    Objective: Vitals:   05/26/18 0300 05/26/18 0352 05/26/18 0400 05/26/18 0718  BP: 99/71  110/80   Pulse: 77  72 76  Resp: 18  (!) 21 19  Temp:  97.9 F (36.6 C)  98.8 F (37.1 C)  TempSrc:  Oral  Oral  SpO2: (!) 87%  92% 90%  Weight:      Height:        Intake/Output Summary (Last 24 hours) at 05/26/2018 0935 Last data filed at 05/26/2018 0600 Gross per 24 hour  Intake 1140.37 ml  Output -  Net 1140.37 ml   Filed Weights   05/23/18 1945 05/23/18 2207  Weight: 97.1 kg 83.8 kg    Examination:  General exam: Alert, awake, no distress Respiratory system: bilateral rhonchi. Respiratory effort normal. Cardiovascular system:RRR. No murmurs, rubs, gallops. Gastrointestinal system: Abdomen is nondistended, soft and nontender. No organomegaly or masses felt. Normal bowel sounds heard. Central nervous system: No focal neurological deficits. Extremities: No C/C/E, +pedal pulses Skin: No rashes, lesions or ulcers Psychiatry: pleasant, confused, engages in conversation.      Data Reviewed: I have personally reviewed following labs and imaging studies  CBC: Recent Labs  Lab 05/23/18 1953 05/24/18 0122 05/25/18 0407  WBC 8.9 7.8 8.0  NEUTROABS 7.0 6.4  --   HGB 15.4* 13.6 13.2  HCT 44.8 40.3 39.0  MCV 81.9 81.7 82.1  PLT 182 165 166   Basic Metabolic Panel: Recent Labs  Lab 05/23/18 1953 05/24/18 0122 05/25/18 0407 05/26/18 0533  NA 140 139 141 142  K 2.5* 3.0* 3.4* 3.3*  CL 106 108 112* 112*  CO2 26 22 24 23   GLUCOSE 149* 232* 171* 121*  BUN 9 9 6 6   CREATININE 0.44 0.37* 0.38* 0.36*  CALCIUM 8.0* 7.5* 7.9* 8.3*  MG 1.9  --   --  1.9   GFR: Estimated Creatinine Clearance: 85.3 mL/min (A) (by C-G formula based on SCr of 0.36 mg/dL (L)). Liver Function Tests: Recent Labs  Lab 05/23/18 1953 05/24/18 0122  AST 27 23  ALT 20 17  ALKPHOS 48 43  BILITOT 0.6 0.8  PROT 7.1 6.4*    ALBUMIN 2.9* 2.6*   No results for input(s): LIPASE, AMYLASE in the last 168 hours. No results for input(s): AMMONIA in the last 168 hours. Coagulation Profile: No results for input(s): INR, PROTIME in the last 168 hours. Cardiac Enzymes: Recent Labs  Lab 05/23/18 1953  TROPONINI <0.03   BNP (last 3 results) No results for input(s): PROBNP in the last 8760 hours. HbA1C: No results for input(s): HGBA1C in the last 72 hours. CBG: Recent Labs  Lab 05/25/18 0825 05/25/18 1133 05/25/18 1609 05/25/18 2124 05/26/18 0714  GLUCAP 120* 151* 116* 107* 110*   Lipid Profile: No results for input(s): CHOL, HDL, LDLCALC, TRIG, CHOLHDL, LDLDIRECT in the last 72 hours. Thyroid Function Tests: No results for input(s): TSH, T4TOTAL, FREET4, T3FREE, THYROIDAB in the last 72 hours. Anemia Panel: No results for input(s): VITAMINB12, FOLATE, FERRITIN, TIBC, IRON, RETICCTPCT in the last 72 hours. Sepsis Labs: Recent Labs  Lab 05/23/18 1953 05/23/18 2212 05/24/18 0122 05/25/18 0407  PROCALCITON <0.10  --  <0.10 <0.10  LATICACIDVEN  --  1.1 0.8  --     Recent Results (from the past 240 hour(s))  MRSA PCR Screening     Status: None   Collection Time: 05/23/18  9:55 PM  Result Value Ref Range Status   MRSA by PCR NEGATIVE NEGATIVE Final    Comment:        The GeneXpert MRSA Assay (FDA approved for NASAL specimens only), is one component of a comprehensive MRSA colonization surveillance program. It is not intended to diagnose MRSA infection nor to guide or monitor treatment for MRSA infections. Performed at [facility], [address], [place], [place] [address]   Culture, blood (routine x 2) Call MD if unable to obtain prior to antibiotics being given     Status: None (Preliminary result)   Collection Time: 05/23/18 10:12 PM  Result Value Ref Range Status   Specimen Description BLOOD LEFT HAND  Final   Special Requests   Final    BOTTLES DRAWN AEROBIC AND ANAEROBIC Blood  Culture adequate volume   Culture   Final    NO GROWTH 3 DAYS Performed at [facility], [address], [place], [place] [address]    Report Status PENDING  Incomplete  Culture, blood (routine x 2) Call MD if unable to obtain prior to antibiotics being given     Status: None (Preliminary result)   Collection Time: 05/23/18 10:18 PM  Result Value Ref Range Status   Specimen Description BLOOD LEFT HAND  Final   Special Requests   Final    BOTTLES DRAWN AEROBIC AND ANAEROBIC Blood Culture adequate volume   Culture   Final    NO GROWTH 3 DAYS Performed at [facility], [address] [place], [place] [address]    Report Status PENDING  Incomplete  Culture, respiratory     Status: None (Preliminary result)   Collection Time: 05/25/18  1:09 AM  Result Value Ref Range Status   Specimen Description   Final    SPUTUM Performed at [facility], [address] [place], [place] [address]    Special Requests   Final    NONE Performed at [facility], [address] [place], [place] [address]    Gram Stain   Final    RARE WBC PRESENT,BOTH PMN AND MONONUCLEAR RARE GRAM POSITIVE RODS RARE BUDDING YEAST SEEN RARE SQUAMOUS EPITHELIAL CELLS PRESENT Performed at [facility], [address] [place], [place] [address]    Culture PENDING  Incomplete   Report Status PENDING  Incomplete     Radiology Studies: No results found. Scheduled Meds: . amLODipine  7.5 mg Oral Daily  . atorvastatin  40 mg Oral q1800  . divalproex  500 mg Oral BID  . enoxaparin (LOVENOX) injection  40 mg Subcutaneous Q24H  . Ferrous Fumarate  1 tablet Oral BID  . insulin aspart  0-5 Units Subcutaneous QHS  . insulin aspart  0-9 Units Subcutaneous TID WC  . ipratropium-albuterol  3 mL Nebulization Q6H  . levETIRAcetam  1,500 mg Oral BID  . linaclotide  145 mcg Oral Daily  . lisinopril  40 mg Oral Daily  . LORazepam  0.25 mg Oral QHS  . mouth rinse  15 mL Mouth Rinse BID  . metoprolol tartrate  50 mg Oral  BID  . montelukast  10 mg Oral QHS  . oseltamivir  75 mg Oral BID  . oxybutynin  5 mg Oral BID  . topiramate  50 mg Oral BID  . venlafaxine XR  112.5 mg Oral Daily   Continuous Infusions: . ceFEPime (MAXIPIME) IV Stopped (05/26/18 0540)  . potassium chloride 10 mEq (05/26/18 0834)     LOS: 3 days   Critical Care Time spent: 31 mins  Standley Dakinslanford Radiah Lubinski, MD [facility] Pager [phone]  If 7PM-7AM, please contact night-coverage [email or web address] Password [email or web address] 05/26/2018, 9:35 AM

## 2018-05-26 NOTE — Progress Notes (Signed)
Transferred from CCU this afternoon.  Alert and oriented x 2.  Repsonds appropriately to simple questions. Nonproductive cough.  Sister at bedside.

## 2018-05-27 ENCOUNTER — Inpatient Hospital Stay (HOSPITAL_COMMUNITY): Payer: Medicare Other

## 2018-05-27 DIAGNOSIS — A419 Sepsis, unspecified organism: Principal | ICD-10-CM

## 2018-05-27 LAB — CBC WITH DIFFERENTIAL/PLATELET
Abs Immature Granulocytes: 0.04 10*3/uL (ref 0.00–0.07)
Basophils Absolute: 0 10*3/uL (ref 0.0–0.1)
Basophils Relative: 0 %
EOS ABS: 0.1 10*3/uL (ref 0.0–0.5)
Eosinophils Relative: 1 %
HCT: 38.1 % (ref 36.0–46.0)
Hemoglobin: 12.8 g/dL (ref 12.0–15.0)
Immature Granulocytes: 1 %
Lymphocytes Relative: 40 %
Lymphs Abs: 2.9 10*3/uL (ref 0.7–4.0)
MCH: 27.9 pg (ref 26.0–34.0)
MCHC: 33.6 g/dL (ref 30.0–36.0)
MCV: 83.2 fL (ref 80.0–100.0)
Monocytes Absolute: 0.6 10*3/uL (ref 0.1–1.0)
Monocytes Relative: 9 %
Neutro Abs: 3.5 10*3/uL (ref 1.7–7.7)
Neutrophils Relative %: 49 %
Platelets: 202 10*3/uL (ref 150–400)
RBC: 4.58 MIL/uL (ref 3.87–5.11)
RDW: 14.6 % (ref 11.5–15.5)
WBC: 7.2 10*3/uL (ref 4.0–10.5)
nRBC: 0 % (ref 0.0–0.2)

## 2018-05-27 LAB — COMPREHENSIVE METABOLIC PANEL
ALT: 16 U/L (ref 0–44)
ANION GAP: 6 (ref 5–15)
AST: 24 U/L (ref 15–41)
Albumin: 2.3 g/dL — ABNORMAL LOW (ref 3.5–5.0)
Alkaline Phosphatase: 39 U/L (ref 38–126)
BUN: 11 mg/dL (ref 6–20)
CO2: 23 mmol/L (ref 22–32)
Calcium: 8.2 mg/dL — ABNORMAL LOW (ref 8.9–10.3)
Chloride: 113 mmol/L — ABNORMAL HIGH (ref 98–111)
Creatinine, Ser: 0.32 mg/dL — ABNORMAL LOW (ref 0.44–1.00)
GFR calc Af Amer: >60 mL/min (ref >60)
GFR calc non Af Amer: >60 mL/min (ref >60)
Glucose, Bld: 114 mg/dL — ABNORMAL HIGH (ref 70–99)
POTASSIUM: 3.4 mmol/L — AB (ref 3.5–5.1)
Sodium: 142 mmol/L (ref 135–145)
Total Bilirubin: 0.7 mg/dL (ref 0.3–1.2)
Total Protein: 6 g/dL — ABNORMAL LOW (ref 6.5–8.1)

## 2018-05-27 LAB — GLUCOSE, CAPILLARY
Glucose-Capillary: 97 mg/dL (ref 70–99)
Glucose-Capillary: 113 mg/dL — ABNORMAL HIGH (ref 70–99)
Glucose-Capillary: 111 mg/dL — ABNORMAL HIGH (ref 70–99)
Glucose-Capillary: 167 mg/dL — ABNORMAL HIGH (ref 70–99)

## 2018-05-27 LAB — CULTURE, RESPIRATORY W GRAM STAIN

## 2018-05-27 LAB — CULTURE, RESPIRATORY

## 2018-05-27 MED ORDER — IPRATROPIUM-ALBUTEROL 0.5-2.5 (3) MG/3ML IN SOLN
3.0000 mL | Freq: Four times a day (QID) | RESPIRATORY_TRACT | Status: DC
  Administered 2018-05-27 – 2018-06-05 (×27): 3 mL via RESPIRATORY_TRACT
  Filled 2018-05-27 (×29): qty 3

## 2018-05-27 NOTE — Clinical Social Work Note (Signed)
LCSW following. Pt discussed in Progression today. Pt not yet stable for dc. [facility] when stable. Updated Thayer Ohmhris at [facility]. Will follow.

## 2018-05-27 NOTE — Progress Notes (Addendum)
PROGRESS NOTE    Tami Nolan  XYV:[id number] DOB: [birth year] DOA: 05/23/2018 PCP: [name], MD    Brief Narrative:  57 year old female with a history of bipolar disorder, hypertension, multiple sclerosis, who is a resident of a skilled nursing facility, admitted to the hospital with shortness of breath, hypoxia and fevers.  Found to have healthcare associated pneumonia and influenza A.  Currently on IV antibiotics and Tamiflu.  She also has significant hypoxia and is on supplemental oxygen.  Assessment & Plan:   Principal Problem:   HCAP (healthcare-associated pneumonia) Active Problems:   Hypokalemia   History of multiple sclerosis   Seizure disorder (HCC)   SIRS (systemic inflammatory response syndrome) (HCC)   Influenza A   Acute respiratory failure with hypoxia (HCC)   1. Acute respiratory failure with hypoxia.  Only minimal improvement noted.  Secondary to pneumonia and influenza.  Patient remains on high flow nasal cannula..  Wean oxygen as able.  Continue treatments.   2. Healthcare associated pneumonia.  Currently on cefepime.  MRSA PCR is negative, vancomycin discontinued.  Continue current treatments. 3. Sepsis - resolved.  4. Influenza A.  Is currently on Tamiflu. Continue supportive care.  5. Hypokalemia.  IV replacement ordered.  Improving with replacement.  Give additional magnesium.  6. Seizure disorder.  Continue on Keppra 7. Bipolar disorder.  Continue on Depakote.   8. Hypertension.  Stable on amlodipine, lisinopril and metoprolol. 9. Hyperlipidemia.  Continue statin 10. Multiple sclerosis.  Resume home medications on discharge.  DVT prophylaxis: lovenox Code Status: full code Family Communication: no family present Disposition Plan: discharge back to SNF when stable   Consultants:     Procedures:     Antimicrobials:   Cefepime 12/19>  Vancomycin 12/19>12/20   Subjective: Patient remains short of breath and unable to get up  secretions.  She is down to 8L HFNC.  She denies chest pain.    Objective: Vitals:   05/26/18 2218 05/27/18 0628 05/27/18 0814 05/27/18 0926  BP: 109/80 96/70  90/72  Pulse: 80 66  63  Resp: 20 18    Temp: 98.7 F (37.1 C) (!) 97.5 F (36.4 C)    TempSrc: Oral Oral    SpO2: 97% 96% 92%   Weight:      Height:        Intake/Output Summary (Last 24 hours) at 05/27/2018 1154 Last data filed at 05/27/2018 0911 Gross per 24 hour  Intake 580 ml  Output 350 ml  Net 230 ml   Filed Weights   05/23/18 1945 05/23/18 2207  Weight: 97.1 kg 83.8 kg    Examination:  General exam: Alert, awake, no distress Respiratory system: bilateral crackles and rhonchi unchanged. Respiratory effort normal. Cardiovascular system: normal s1,s2 sounds. No murmurs, rubs, gallops. Gastrointestinal system: Abdomen is nondistended, soft and nontender. No organomegaly or masses felt. Normal bowel sounds heard. Central nervous system: No focal neurological deficits. Extremities: No C/C/E, +pedal pulses Skin: No rashes, lesions or ulcers Psychiatry: normal affect.   Data Reviewed: I have personally reviewed following labs and imaging studies  CBC: Recent Labs  Lab 05/23/18 1953 05/24/18 0122 05/25/18 0407 05/27/18 0555  WBC 8.9 7.8 8.0 7.2  NEUTROABS 7.0 6.4  --  3.5  HGB 15.4* 13.6 13.2 12.8  HCT 44.8 40.3 39.0 38.1  MCV 81.9 81.7 82.1 83.2  PLT 182 165 166 202   Basic Metabolic Panel: Recent Labs  Lab 05/23/18 1953 05/24/18 0122 05/25/18 0407 05/26/18 0533 05/27/18 0555  NA  140 139 141 142 142  K 2.5* 3.0* 3.4* 3.3* 3.4*  CL 106 108 112* 112* 113*  CO2 26 22 24 23 23   GLUCOSE 149* 232* 171* 121* 114*  BUN 9 9 6 6 11   CREATININE 0.44 0.37* 0.38* 0.36* 0.32*  CALCIUM 8.0* 7.5* 7.9* 8.3* 8.2*  MG 1.9  --   --  1.9  --    GFR: Estimated Creatinine Clearance: 85.3 mL/min (A) (by C-G formula based on SCr of 0.32 mg/dL (L)). Liver Function Tests: Recent Labs  Lab 05/23/18 1953  05/24/18 0122 05/27/18 0555  AST 27 23 24   ALT 20 17 16   ALKPHOS 48 43 39  BILITOT 0.6 0.8 0.7  PROT 7.1 6.4* 6.0*  ALBUMIN 2.9* 2.6* 2.3*   No results for input(s): LIPASE, AMYLASE in the last 168 hours. No results for input(s): AMMONIA in the last 168 hours. Coagulation Profile: No results for input(s): INR, PROTIME in the last 168 hours. Cardiac Enzymes: Recent Labs  Lab 05/23/18 1953  TROPONINI <0.03   BNP (last 3 results) No results for input(s): PROBNP in the last 8760 hours. HbA1C: No results for input(s): HGBA1C in the last 72 hours. CBG: Recent Labs  Lab 05/26/18 1127 05/26/18 1639 05/26/18 2221 05/27/18 0742 05/27/18 1122  GLUCAP 137* 134* 138* 97 113*   Lipid Profile: No results for input(s): CHOL, HDL, LDLCALC, TRIG, CHOLHDL, LDLDIRECT in the last 72 hours. Thyroid Function Tests: No results for input(s): TSH, T4TOTAL, FREET4, T3FREE, THYROIDAB in the last 72 hours. Anemia Panel: No results for input(s): VITAMINB12, FOLATE, FERRITIN, TIBC, IRON, RETICCTPCT in the last 72 hours. Sepsis Labs: Recent Labs  Lab 05/23/18 1953 05/23/18 2212 05/24/18 0122 05/25/18 0407  PROCALCITON <0.10  --  <0.10 <0.10  LATICACIDVEN  --  1.1 0.8  --     Recent Results (from the past 240 hour(s))  MRSA PCR Screening     Status: None   Collection Time: 05/23/18  9:55 PM  Result Value Ref Range Status   MRSA by PCR NEGATIVE NEGATIVE Final    Comment:        The GeneXpert MRSA Assay (FDA approved for NASAL specimens only), is one component of a comprehensive MRSA colonization surveillance program. It is not intended to diagnose MRSA infection nor to guide or monitor treatment for MRSA infections. Performed at [facility], [address] [place], [place] [address]   Culture, blood (routine x 2) Call MD if unable to obtain prior to antibiotics being given     Status: None (Preliminary result)   Collection Time: 05/23/18 10:12 PM  Result Value Ref Range Status    Specimen Description BLOOD LEFT HAND  Final   Special Requests   Final    BOTTLES DRAWN AEROBIC AND ANAEROBIC Blood Culture adequate volume   Culture   Final    NO GROWTH 4 DAYS Performed at [facility], [address] [place], [place] [address]    Report Status PENDING  Incomplete  Culture, blood (routine x 2) Call MD if unable to obtain prior to antibiotics being given     Status: None (Preliminary result)   Collection Time: 05/23/18 10:18 PM  Result Value Ref Range Status   Specimen Description BLOOD LEFT HAND  Final   Special Requests   Final    BOTTLES DRAWN AEROBIC AND ANAEROBIC Blood Culture adequate volume   Culture   Final    NO GROWTH 4 DAYS Performed at [facility], [address] [place],  [place] [address]    Report Status PENDING  Incomplete  Culture, respiratory     Status: None (Preliminary result)   Collection Time: 05/25/18  1:09 AM  Result Value Ref Range Status   Specimen Description   Final    SPUTUM Performed at [facility], [address] [place], [place] [address]    Special Requests   Final    NONE Performed at [facility], [address] [place], [place] [address]    Gram Stain   Final    RARE WBC PRESENT,BOTH PMN AND MONONUCLEAR RARE GRAM POSITIVE RODS RARE BUDDING YEAST SEEN RARE SQUAMOUS EPITHELIAL CELLS PRESENT    Culture   Final    CULTURE REINCUBATED FOR BETTER GROWTH Performed at [facility], [address] [place], [place] [address]    Report Status PENDING  Incomplete     Radiology Studies: Dg Chest Port 1 View  Result Date: 05/27/2018 CLINICAL DATA:  Shortness of breath.  Pneumonia. EXAM: PORTABLE CHEST 1 VIEW COMPARISON:  05/23/2018. FINDINGS: Cardiomegaly with diffuse bilateral pulmonary infiltrates/edema, progressed from prior exam. Prominent bibasilar atelectasis again noted. Bilateral pleural effusions are also again most likely present. No pneumothorax. No acute bony abnormality. IMPRESSION: Cardiomegaly with diffuse  bilateral pulmonary infiltrates/edema, progressed from prior exam. Prominent bibasilar atelectasis again noted. Bilateral pleural effusions are also again most likely present. Electronically Signed   By: Maisie Fushomas  Register   On: 05/27/2018 06:54   Scheduled Meds: . amLODipine  7.5 mg Oral Daily  . atorvastatin  40 mg Oral q1800  . divalproex  500 mg Oral BID  . enoxaparin (LOVENOX) injection  40 mg Subcutaneous Q24H  . Ferrous Fumarate  1 tablet Oral BID  . insulin aspart  0-5 Units Subcutaneous QHS  . insulin aspart  0-9 Units Subcutaneous TID WC  . ipratropium-albuterol  3 mL Nebulization Q6H WA  . levETIRAcetam  1,500 mg Oral BID  . linaclotide  145 mcg Oral Daily  . lisinopril  40 mg Oral Daily  . LORazepam  0.25 mg Oral QHS  . mouth rinse  15 mL Mouth Rinse BID  . metoprolol tartrate  50 mg Oral BID  . montelukast  10 mg Oral QHS  . oseltamivir  75 mg Oral BID  . oxybutynin  5 mg Oral BID  . topiramate  50 mg Oral BID  . venlafaxine XR  112.5 mg Oral Daily   Continuous Infusions: . ceFEPime (MAXIPIME) IV 1 g (05/27/18 0535)     LOS: 4 days  \ Standley Dakinslanford Johnson, MD [facility] Pager [phone]  If 7PM-7AM, please contact night-coverage [email or web address] Password [email or web address] 05/27/2018, 11:54 AM

## 2018-05-28 LAB — CBC WITH DIFFERENTIAL/PLATELET
Abs Immature Granulocytes: 0.05 10*3/uL (ref 0.00–0.07)
BASOS PCT: 1 %
Basophils Absolute: 0.1 10*3/uL (ref 0.0–0.1)
EOS ABS: 0.1 10*3/uL (ref 0.0–0.5)
Eosinophils Relative: 2 %
HCT: 40.8 % (ref 36.0–46.0)
Hemoglobin: 13.7 g/dL (ref 12.0–15.0)
Immature Granulocytes: 1 %
Lymphocytes Relative: 38 %
Lymphs Abs: 2.8 10*3/uL (ref 0.7–4.0)
MCH: 27.5 pg (ref 26.0–34.0)
MCHC: 33.6 g/dL (ref 30.0–36.0)
MCV: 81.8 fL (ref 80.0–100.0)
Monocytes Absolute: 0.8 10*3/uL (ref 0.1–1.0)
Monocytes Relative: 11 %
Neutro Abs: 3.6 10*3/uL (ref 1.7–7.7)
Neutrophils Relative %: 47 %
Platelets: 245 10*3/uL (ref 150–400)
RBC: 4.99 MIL/uL (ref 3.87–5.11)
RDW: 14.6 % (ref 11.5–15.5)
WBC: 7.5 10*3/uL (ref 4.0–10.5)
nRBC: 0 % (ref 0.0–0.2)

## 2018-05-28 LAB — COMPREHENSIVE METABOLIC PANEL
ALT: 18 U/L (ref 0–44)
AST: 24 U/L (ref 15–41)
Albumin: 2.6 g/dL — ABNORMAL LOW (ref 3.5–5.0)
Alkaline Phosphatase: 44 U/L (ref 38–126)
Anion gap: 6 (ref 5–15)
BUN: 8 mg/dL (ref 6–20)
CO2: 23 mmol/L (ref 22–32)
Calcium: 8.4 mg/dL — ABNORMAL LOW (ref 8.9–10.3)
Chloride: 113 mmol/L — ABNORMAL HIGH (ref 98–111)
Creatinine, Ser: 0.4 mg/dL — ABNORMAL LOW (ref 0.44–1.00)
GFR calc non Af Amer: >60 mL/min (ref >60)
Glucose, Bld: 104 mg/dL — ABNORMAL HIGH (ref 70–99)
Potassium: 3.6 mmol/L (ref 3.5–5.1)
Sodium: 142 mmol/L (ref 135–145)
Total Bilirubin: 0.7 mg/dL (ref 0.3–1.2)
Total Protein: 6.9 g/dL (ref 6.5–8.1)

## 2018-05-28 LAB — GLUCOSE, CAPILLARY
GLUCOSE-CAPILLARY: 96 mg/dL (ref 70–99)
GLUCOSE-CAPILLARY: 114 mg/dL — AB (ref 70–99)
Glucose-Capillary: 99 mg/dL (ref 70–99)
Glucose-Capillary: 115 mg/dL — ABNORMAL HIGH (ref 70–99)

## 2018-05-28 LAB — CULTURE, BLOOD (ROUTINE X 2)
Culture: NO GROWTH
Culture: NO GROWTH
Special Requests: ADEQUATE
Special Requests: ADEQUATE

## 2018-05-28 NOTE — Progress Notes (Signed)
PROGRESS NOTE    Tami Nolan  MWU:[id number] DOB: Oct 11, 1960 DOA: 05/23/2018 PCP: [name], MD    Brief Narrative:  57 year old female with a history of bipolar disorder, hypertension, multiple sclerosis, who is a resident of a skilled nursing facility, admitted to the hospital with shortness of breath, hypoxia and fevers.  Found to have healthcare associated pneumonia and influenza A.  Currently on IV antibiotics and Tamiflu.  She also has significant hypoxia and is on supplemental oxygen.  Assessment & Plan:   Principal Problem:   HCAP (healthcare-associated pneumonia) Active Problems:   Hypokalemia   History of multiple sclerosis   Seizure disorder (HCC)   SIRS (systemic inflammatory response syndrome) (HCC)   Influenza A   Acute respiratory failure with hypoxia (HCC)   1. Acute respiratory failure with hypoxia.  Secondary to pneumonia and influenza.  Patient remains on high flow nasal cannula.  She is now down to 5 L.  Wean oxygen as able.  Continue treatments.   2. Healthcare associated pneumonia.  Currently on cefepime.  MRSA PCR is negative, vancomycin discontinued.  Continue current treatments.  Can likely discontinue after tomorrow's dose. 3. Sepsis - resolved.  4. Influenza A.  Is currently on Tamiflu. Continue supportive care.  5. Hypokalemia.  IV replacement ordered.  Improving with replacement.  Give additional magnesium.  6. Seizure disorder.  Continue on Keppra 7. Bipolar disorder.  Continue on Depakote.   8. Hypertension.  Stable on amlodipine, lisinopril and metoprolol. 9. Hyperlipidemia.  Continue statin 10. Multiple sclerosis.  Resume home medications on discharge.  DVT prophylaxis: lovenox Code Status: full code Family Communication: no family present Disposition Plan: discharge back to SNF when stable   Consultants:     Procedures:     Antimicrobials:   Cefepime 12/19>  Vancomycin 12/19>12/20   Subjective: Continues to have  productive cough.  Still requiring large amounts of supplemental oxygen.  Objective: Vitals:   05/28/18 0520 05/28/18 0753 05/28/18 1510 05/28/18 1934  BP: 134/84  106/72   Pulse: 76  80 82  Resp: 19  20 20   Temp: 98.2 F (36.8 C)  98.6 F (37 C)   TempSrc: Oral     SpO2: 91% 94% 95% (!) 83%  Weight:      Height:        Intake/Output Summary (Last 24 hours) at 05/28/2018 2013 Last data filed at 05/28/2018 1447 Gross per 24 hour  Intake 300 ml  Output 400 ml  Net -100 ml   Filed Weights   05/23/18 1945 05/23/18 2207  Weight: 97.1 kg 83.8 kg    Examination:  General exam: Alert, awake, no distress Respiratory system: Bilateral rhonchi. Respiratory effort normal. Cardiovascular system:RRR. No murmurs, rubs, gallops. Gastrointestinal system: Abdomen is nondistended, soft and nontender. No organomegaly or masses felt. Normal bowel sounds heard. Central nervous system:  No focal neurological deficits. Extremities: No C/C/E, +pedal pulses Skin: No rashes, lesions or ulcers Psychiatry: Confused, pleasant.    Data Reviewed: I have personally reviewed following labs and imaging studies  CBC: Recent Labs  Lab 05/23/18 1953 05/24/18 0122 05/25/18 0407 05/27/18 0555 05/28/18 0704  WBC 8.9 7.8 8.0 7.2 7.5  NEUTROABS 7.0 6.4  --  3.5 3.6  HGB 15.4* 13.6 13.2 12.8 13.7  HCT 44.8 40.3 39.0 38.1 40.8  MCV 81.9 81.7 82.1 83.2 81.8  PLT 182 165 166 202 245   Basic Metabolic Panel: Recent Labs  Lab 05/23/18 1953 05/24/18 0122 05/25/18 0407 05/26/18 0533 05/27/18 7253  05/28/18 0704  NA 140 139 141 142 142 142  K 2.5* 3.0* 3.4* 3.3* 3.4* 3.6  CL 106 108 112* 112* 113* 113*  CO2 26 22 24 23 23 23   GLUCOSE 149* 232* 171* 121* 114* 104*  BUN 9 9 6 6 11 8   CREATININE 0.44 0.37* 0.38* 0.36* 0.32* 0.40*  CALCIUM 8.0* 7.5* 7.9* 8.3* 8.2* 8.4*  MG 1.9  --   --  1.9  --   --    GFR: Estimated Creatinine Clearance: 85.3 mL/min (A) (by C-G formula based on SCr of 0.4 mg/dL  (L)). Liver Function Tests: Recent Labs  Lab 05/23/18 1953 05/24/18 0122 05/27/18 0555 05/28/18 0704  AST 27 23 24 24   ALT 20 17 16 18   ALKPHOS 48 43 39 44  BILITOT 0.6 0.8 0.7 0.7  PROT 7.1 6.4* 6.0* 6.9  ALBUMIN 2.9* 2.6* 2.3* 2.6*   No results for input(s): LIPASE, AMYLASE in the last 168 hours. No results for input(s): AMMONIA in the last 168 hours. Coagulation Profile: No results for input(s): INR, PROTIME in the last 168 hours. Cardiac Enzymes: Recent Labs  Lab 05/23/18 1953  TROPONINI <0.03   BNP (last 3 results) No results for input(s): PROBNP in the last 8760 hours. HbA1C: No results for input(s): HGBA1C in the last 72 hours. CBG: Recent Labs  Lab 05/27/18 1636 05/27/18 2153 05/28/18 0800 05/28/18 1149 05/28/18 1652  GLUCAP 111* 167* 99 115* 96   Lipid Profile: No results for input(s): CHOL, HDL, LDLCALC, TRIG, CHOLHDL, LDLDIRECT in the last 72 hours. Thyroid Function Tests: No results for input(s): TSH, T4TOTAL, FREET4, T3FREE, THYROIDAB in the last 72 hours. Anemia Panel: No results for input(s): VITAMINB12, FOLATE, FERRITIN, TIBC, IRON, RETICCTPCT in the last 72 hours. Sepsis Labs: Recent Labs  Lab 05/23/18 1953 05/23/18 2212 05/24/18 0122 05/25/18 0407  PROCALCITON <0.10  --  <0.10 <0.10  LATICACIDVEN  --  1.1 0.8  --     Recent Results (from the past 240 hour(s))  MRSA PCR Screening     Status: None   Collection Time: 05/23/18  9:55 PM  Result Value Ref Range Status   MRSA by PCR NEGATIVE NEGATIVE Final    Comment:        The GeneXpert MRSA Assay (FDA approved for NASAL specimens only), is one component of a comprehensive MRSA colonization surveillance program. It is not intended to diagnose MRSA infection nor to guide or monitor treatment for MRSA infections. Performed at [facility], [address] [place], [place] [address]   Culture, blood (routine x 2) Call MD if unable to obtain prior to antibiotics being given     Status:  None   Collection Time: 05/23/18 10:12 PM  Result Value Ref Range Status   Specimen Description BLOOD LEFT HAND  Final   Special Requests   Final    BOTTLES DRAWN AEROBIC AND ANAEROBIC Blood Culture adequate volume   Culture   Final    NO GROWTH 5 DAYS Performed at [facility], [address] [place], [place] [address]    Report Status 05/28/2018 FINAL  Final  Culture, blood (routine x 2) Call MD if unable to obtain prior to antibiotics being given     Status: None   Collection Time: 05/23/18 10:18 PM  Result Value Ref Range Status   Specimen Description BLOOD LEFT HAND  Final   Special Requests   Final    BOTTLES DRAWN AEROBIC AND ANAEROBIC Blood Culture adequate volume  Culture   Final    NO GROWTH 5 DAYS Performed at [facility], [address] [place], [place] [address]    Report Status 05/28/2018 FINAL  Final  Culture, respiratory     Status: None   Collection Time: 05/25/18  1:09 AM  Result Value Ref Range Status   Specimen Description   Final    SPUTUM Performed at [facility], [address], [place], [place] [address]    Special Requests   Final    NONE Performed at [facility], [address], [place], [place] [address]    Gram Stain   Final    RARE WBC PRESENT,BOTH PMN AND MONONUCLEAR RARE GRAM POSITIVE RODS RARE BUDDING YEAST SEEN RARE SQUAMOUS EPITHELIAL CELLS PRESENT Performed at [facility], [address], [place], [place] [address]    Culture FEW CANDIDA DUBLINIENSIS  Final   Report Status 05/27/2018 FINAL  Final     Radiology Studies: Dg Chest Port 1 View  Result Date: 05/27/2018 CLINICAL DATA:  Shortness of breath.  Pneumonia. EXAM: PORTABLE CHEST 1 VIEW COMPARISON:  05/23/2018. FINDINGS: Cardiomegaly with diffuse bilateral pulmonary infiltrates/edema, progressed from prior exam. Prominent bibasilar atelectasis again noted. Bilateral pleural effusions are also again most likely present. No pneumothorax. No acute bony abnormality.  IMPRESSION: Cardiomegaly with diffuse bilateral pulmonary infiltrates/edema, progressed from prior exam. Prominent bibasilar atelectasis again noted. Bilateral pleural effusions are also again most likely present. Electronically Signed   By: Maisie Fus  Register   On: 05/27/2018 06:54   Scheduled Meds: . amLODipine  7.5 mg Oral Daily  . atorvastatin  40 mg Oral q1800  . divalproex  500 mg Oral BID  . enoxaparin (LOVENOX) injection  40 mg Subcutaneous Q24H  . Ferrous Fumarate  1 tablet Oral BID  . insulin aspart  0-5 Units Subcutaneous QHS  . insulin aspart  0-9 Units Subcutaneous TID WC  . ipratropium-albuterol  3 mL Nebulization Q6H WA  . levETIRAcetam  1,500 mg Oral BID  . linaclotide  145 mcg Oral Daily  . lisinopril  40 mg Oral Daily  . LORazepam  0.25 mg Oral QHS  . mouth rinse  15 mL Mouth Rinse BID  . metoprolol tartrate  50 mg Oral BID  . montelukast  10 mg Oral QHS  . oseltamivir  75 mg Oral BID  . oxybutynin  5 mg Oral BID  . topiramate  50 mg Oral BID  . venlafaxine XR  112.5 mg Oral Daily   Continuous Infusions: . ceFEPime (MAXIPIME) IV Stopped (05/28/18 1346)     LOS: 5 days  \ Erick Blinks, MD [facility] Pager [phone]  If 7PM-7AM, please contact night-coverage [email or web address] Password [facility] 05/28/2018, 8:13 PM

## 2018-05-28 NOTE — Evaluation (Signed)
Physical Therapy Evaluation Patient Details Name: Tami Nolan MRN: [id number] DOB: 02/20/1961 Today's Date: 05/28/2018   History of Present Illness  Tami Nolan is a 57 y.o. female with medical history significant of bipolar disorder, CHF, dementia, MS, hypertension comes in with shortness of breath for an unknown period of time at the nursing home.  All history is obtained from emergency room staff and records from nursing home which is sparse.  Patient cannot provide any reliable history secondary to her dementia.  On arrival to the emergency department patient was short of breath and hypoxic with O2 sats of 80% on room air.  She does not require oxygen at the nursing home.  She can answer simple yes and no questions.  She is not oriented to place or time.  She denies any pain.  She is comfortable on nonrebreather at this time.  She does report she is been coughing a lot.    Clinical Impression  Patient has dementia and is poor historian, states she was living alone, but per chart patient came from SNF and more likely at baseline functional status.  Patient demonstrated poor carryover for following instructions, unable to stand or transfer to chair due to BLE weakness and/or not processing instructions.  Patient unable to stand with RW or with hand held assist.  Patient put back to bed with bed alarm on.  Patient will benefit from continued physical therapy in hospital to avoid further deconditioningl and recommend return to SNF for out of bed with mechanical lift if necessary.   Follow Up Recommendations SNF;Supervision/Assistance - 24 hour    Equipment Recommendations       Recommendations for Other Services       Precautions / Restrictions Precautions Precautions: Fall Restrictions Weight Bearing Restrictions: No      Mobility  Bed Mobility Overal bed mobility: Needs Assistance Bed Mobility: Supine to Sit;Sit to Supine     Supine to sit: Mod assist;Max assist Sit to  supine: Max assist;Mod assist   General bed mobility comments: labored movement  Transfers Overall transfer level: Needs assistance   Transfers: Sit to/from Stand Sit to Stand: Max assist         General transfer comment: only able to partially stand with RW (lift bottom off bed)  Ambulation/Gait                Stairs            Wheelchair Mobility    Modified Rankin (Stroke Patients Only)       Balance Overall balance assessment: Needs assistance Sitting-balance support: Feet supported;No upper extremity supported Sitting balance-Leahy Scale: Fair     Standing balance support: Bilateral upper extremity supported;During functional activity Standing balance-Leahy Scale: Poor Standing balance comment: unable to stand                             Pertinent Vitals/Pain Pain Assessment: No/denies pain    Home Living Family/patient expects to be discharged to:: Skilled nursing facility                      Prior Function Level of Independence: Needs assistance   Gait / Transfers Assistance Needed: short distanced household ambulator with RW per patient  ADL's / Homemaking Assistance Needed: assisted by SNF staff        Hand Dominance        Extremity/Trunk Assessment   Upper Extremity  Assessment Upper Extremity Assessment: Generalized weakness    Lower Extremity Assessment Lower Extremity Assessment: Generalized weakness    Cervical / Trunk Assessment Cervical / Trunk Assessment: Normal  Communication   Communication: No difficulties  Cognition Arousal/Alertness: Awake/alert Behavior During Therapy: Flat affect Overall Cognitive Status: No family/caregiver present to determine baseline cognitive functioning                                 General Comments: requires repeated verbal/tactile cueing to complete functional tasks      General Comments      Exercises     Assessment/Plan    PT  Assessment Patient needs continued PT services  PT Problem List Decreased strength;Decreased activity tolerance;Decreased balance;Decreased mobility       PT Treatment Interventions Gait training;Functional mobility training;Therapeutic activities;Patient/family education;Therapeutic exercise    PT Goals (Current goals can be found in the Care Plan section)  Acute Rehab PT Goals Patient Stated Goal: return home PT Goal Formulation: With patient Time For Goal Achievement: 06/11/18 Potential to Achieve Goals: Fair    Frequency Min 3X/week   Barriers to discharge        Co-evaluation               AM-PAC PT "6 Clicks" Mobility  Outcome Measure Help needed turning from your back to your side while in a flat bed without using bedrails?: Total Help needed moving from lying on your back to sitting on the side of a flat bed without using bedrails?: Total Help needed moving to and from a bed to a chair (including a wheelchair)?: Total Help needed standing up from a chair using your arms (e.g., wheelchair or bedside chair)?: Total Help needed to walk in hospital room?: Total Help needed climbing 3-5 steps with a railing? : Total 6 Click Score: 6    End of Session   Activity Tolerance: Patient limited by fatigue;Patient tolerated treatment well Patient left: in bed;with call bell/phone within reach;with bed alarm set Nurse Communication: Mobility status PT Visit Diagnosis: Unsteadiness on feet (R26.81);Other abnormalities of gait and mobility (R26.89);Muscle weakness (generalized) (M62.81)    Time: 0315-9458 PT Time Calculation (min) (ACUTE ONLY): 32 min   Charges:   PT Evaluation $PT Eval Moderate Complexity: 1 Mod PT Treatments $Therapeutic Activity: 23-37 mins        2:13 PM, 05/28/18 Ocie Bob, MPT Physical Therapist with [facility] [phone] office [phone] mobile phone

## 2018-05-28 NOTE — Plan of Care (Signed)
  Problem: Acute Rehab PT Goals(only PT should resolve) Goal: Pt Will Go Supine/Side To Sit Outcome: Progressing Flowsheets (Taken 05/28/2018 1415) Pt will go Supine/Side to Sit: with moderate assist Goal: Patient Will Transfer Sit To/From Stand Outcome: Progressing Flowsheets (Taken 05/28/2018 1415) Patient will transfer sit to/from stand: with moderate assist Goal: Pt Will Transfer Bed To Chair/Chair To Bed Outcome: Progressing Flowsheets (Taken 05/28/2018 1415) Pt will Transfer Bed to Chair/Chair to Bed: with mod assist Goal: Pt Will Ambulate Outcome: Progressing Flowsheets (Taken 05/28/2018 1415) Pt will Ambulate: 10 feet; with moderate assist; with rolling walker   2:16 PM, 05/28/18 Ocie BobJames Tiffaney Heimann, MPT Physical Therapist with [facility] [phone] office [phone] mobile phone

## 2018-05-28 NOTE — Clinical Social Work Note (Signed)
LCSW following. Pt discussed in Progression today. PT evaluation ordered. Spoke with Fayrene FearingJames in PT who states that pt appears to be able to return to her SNF with no additional therapy needs. Will follow and assist with transition [facility] when pt is medically stable.

## 2018-05-29 LAB — CBC WITH DIFFERENTIAL/PLATELET
Abs Immature Granulocytes: 0.06 10*3/uL (ref 0.00–0.07)
BASOS ABS: 0.1 10*3/uL (ref 0.0–0.1)
Basophils Relative: 1 %
EOS ABS: 0.2 10*3/uL (ref 0.0–0.5)
Eosinophils Relative: 3 %
HCT: 41.8 % (ref 36.0–46.0)
Hemoglobin: 14 g/dL (ref 12.0–15.0)
Immature Granulocytes: 1 %
Lymphocytes Relative: 28 %
Lymphs Abs: 2 10*3/uL (ref 0.7–4.0)
MCH: 27.8 pg (ref 26.0–34.0)
MCHC: 33.5 g/dL (ref 30.0–36.0)
MCV: 82.9 fL (ref 80.0–100.0)
Monocytes Absolute: 1 10*3/uL (ref 0.1–1.0)
Monocytes Relative: 13 %
NRBC: 0 % (ref 0.0–0.2)
Neutro Abs: 4 10*3/uL (ref 1.7–7.7)
Neutrophils Relative %: 54 %
Platelets: 295 10*3/uL (ref 150–400)
RBC: 5.04 MIL/uL (ref 3.87–5.11)
RDW: 14.9 % (ref 11.5–15.5)
WBC: 7.2 10*3/uL (ref 4.0–10.5)

## 2018-05-29 LAB — COMPREHENSIVE METABOLIC PANEL
ALT: 20 U/L (ref 0–44)
AST: 26 U/L (ref 15–41)
Albumin: 2.7 g/dL — ABNORMAL LOW (ref 3.5–5.0)
Alkaline Phosphatase: 48 U/L (ref 38–126)
Anion gap: 7 (ref 5–15)
BUN: 10 mg/dL (ref 6–20)
CO2: 24 mmol/L (ref 22–32)
Calcium: 8.5 mg/dL — ABNORMAL LOW (ref 8.9–10.3)
Chloride: 112 mmol/L — ABNORMAL HIGH (ref 98–111)
Creatinine, Ser: 0.54 mg/dL (ref 0.44–1.00)
GFR calc Af Amer: >60 mL/min (ref >60)
GFR calc non Af Amer: >60 mL/min (ref >60)
Glucose, Bld: 124 mg/dL — ABNORMAL HIGH (ref 70–99)
POTASSIUM: 3.3 mmol/L — AB (ref 3.5–5.1)
Sodium: 143 mmol/L (ref 135–145)
Total Bilirubin: 0.7 mg/dL (ref 0.3–1.2)
Total Protein: 7 g/dL (ref 6.5–8.1)

## 2018-05-29 LAB — GLUCOSE, CAPILLARY
GLUCOSE-CAPILLARY: 113 mg/dL — AB (ref 70–99)
Glucose-Capillary: 120 mg/dL — ABNORMAL HIGH (ref 70–99)
Glucose-Capillary: 109 mg/dL — ABNORMAL HIGH (ref 70–99)
Glucose-Capillary: 125 mg/dL — ABNORMAL HIGH (ref 70–99)

## 2018-05-29 MED ORDER — POTASSIUM CHLORIDE CRYS ER 20 MEQ PO TBCR
40.0000 meq | EXTENDED_RELEASE_TABLET | Freq: Once | ORAL | Status: AC
  Administered 2018-05-29: 40 meq via ORAL
  Filled 2018-05-29: qty 2

## 2018-05-29 NOTE — Progress Notes (Signed)
PROGRESS NOTE    Tami Nolan  NWG:[id number] DOB: 05-Sep-1960 DOA: 05/23/2018 PCP: [name], MD    Brief Narrative:  57 year old female with a history of bipolar disorder, hypertension, multiple sclerosis, who is a resident of a skilled nursing facility, admitted to the hospital with shortness of breath, hypoxia and fevers.  Found to have healthcare associated pneumonia and influenza A.  Currently on IV antibiotics and Tamiflu.  She also has significant hypoxia and is on supplemental oxygen.  Assessment & Plan:   Principal Problem:   HCAP (healthcare-associated pneumonia) Active Problems:   Hypokalemia   History of multiple sclerosis   Seizure disorder (HCC)   SIRS (systemic inflammatory response syndrome) (HCC)   Influenza A   Acute respiratory failure with hypoxia (HCC)   1. Acute respiratory failure with hypoxia.  Secondary to pneumonia and influenza.  Patient remains on high flow nasal cannula.  She is now down to 3 L.  Slowly improving. Wean oxygen as able.  Continue treatments.   2. Healthcare associated pneumonia.  Currently on cefepime.  MRSA PCR is negative, vancomycin discontinued.  Continue current treatments.  She will complete her antibiotics tomorrow 3. Sepsis - resolved.  4. Influenza A.  Completed a course of Tamiflu. Continue supportive care.  5. Hypokalemia. replace 6. Seizure disorder.  Continue on Keppra 7. Bipolar disorder.  Continue on Depakote.   8. Hypertension.  Stable on amlodipine, lisinopril and metoprolol. 9. Hyperlipidemia.  Continue statin 10. Multiple sclerosis.  Resume home medications on discharge.  DVT prophylaxis: lovenox Code Status: full code Family Communication: no family present Disposition Plan: discharge back to SNF when stable   Consultants:     Procedures:     Antimicrobials:   Cefepime 12/19>  Vancomycin 12/19>12/20   Subjective: Denies any shortness of breath, still has productive cough, still on  oxygen  Objective: Vitals:   05/28/18 1934 05/28/18 2212 05/29/18 0633 05/29/18 0728  BP:  122/70 136/83   Pulse: 82 68 87   Resp: 20     Temp:   98.3 F (36.8 C)   TempSrc:   Oral   SpO2: (!) 83%  93% 94%  Weight:      Height:        Intake/Output Summary (Last 24 hours) at 05/29/2018 1525 Last data filed at 05/29/2018 0500 Gross per 24 hour  Intake 100 ml  Output -  Net 100 ml   Filed Weights   05/23/18 1945 05/23/18 2207  Weight: 97.1 kg 83.8 kg    Examination:  General exam: Alert, awake, no distress Respiratory system: bilateral crackles. Respiratory effort normal. Cardiovascular system:RRR. No murmurs, rubs, gallops. Gastrointestinal system: Abdomen is nondistended, soft and nontender. No organomegaly or masses felt. Normal bowel sounds heard. Central nervous system:  No focal neurological deficits. Extremities: No C/C/E, +pedal pulses Skin: No rashes, lesions or ulcers Psychiatry: confused    Data Reviewed: I have personally reviewed following labs and imaging studies  CBC: Recent Labs  Lab 05/23/18 1953 05/24/18 0122 05/25/18 0407 05/27/18 0555 05/28/18 0704 05/29/18 0654  WBC 8.9 7.8 8.0 7.2 7.5 7.2  NEUTROABS 7.0 6.4  --  3.5 3.6 4.0  HGB 15.4* 13.6 13.2 12.8 13.7 14.0  HCT 44.8 40.3 39.0 38.1 40.8 41.8  MCV 81.9 81.7 82.1 83.2 81.8 82.9  PLT 182 165 166 202 245 295   Basic Metabolic Panel: Recent Labs  Lab 05/23/18 1953  05/25/18 0407 05/26/18 0533 05/27/18 0555 05/28/18 0704 05/29/18 0654  NA 140   < >  141 142 142 142 143  K 2.5*   < > 3.4* 3.3* 3.4* 3.6 3.3*  CL 106   < > 112* 112* 113* 113* 112*  CO2 26   < > 24 23 23 23 24   GLUCOSE 149*   < > 171* 121* 114* 104* 124*  BUN 9   < > 6 6 11 8 10   CREATININE 0.44   < > 0.38* 0.36* 0.32* 0.40* 0.54  CALCIUM 8.0*   < > 7.9* 8.3* 8.2* 8.4* 8.5*  MG 1.9  --   --  1.9  --   --   --    < > = values in this interval not displayed.   GFR: Estimated Creatinine Clearance: 85.3 mL/min (by  C-G formula based on SCr of 0.54 mg/dL). Liver Function Tests: Recent Labs  Lab 05/23/18 1953 05/24/18 0122 05/27/18 0555 05/28/18 0704 05/29/18 0654  AST 27 23 24 24 26   ALT 20 17 16 18 20   ALKPHOS 48 43 39 44 48  BILITOT 0.6 0.8 0.7 0.7 0.7  PROT 7.1 6.4* 6.0* 6.9 7.0  ALBUMIN 2.9* 2.6* 2.3* 2.6* 2.7*   No results for input(s): LIPASE, AMYLASE in the last 168 hours. No results for input(s): AMMONIA in the last 168 hours. Coagulation Profile: No results for input(s): INR, PROTIME in the last 168 hours. Cardiac Enzymes: Recent Labs  Lab 05/23/18 1953  TROPONINI <0.03   BNP (last 3 results) No results for input(s): PROBNP in the last 8760 hours. HbA1C: No results for input(s): HGBA1C in the last 72 hours. CBG: Recent Labs  Lab 05/28/18 1149 05/28/18 1652 05/28/18 2132 05/29/18 0748 05/29/18 1218  GLUCAP 115* 96 114* 120* 109*   Lipid Profile: No results for input(s): CHOL, HDL, LDLCALC, TRIG, CHOLHDL, LDLDIRECT in the last 72 hours. Thyroid Function Tests: No results for input(s): TSH, T4TOTAL, FREET4, T3FREE, THYROIDAB in the last 72 hours. Anemia Panel: No results for input(s): VITAMINB12, FOLATE, FERRITIN, TIBC, IRON, RETICCTPCT in the last 72 hours. Sepsis Labs: Recent Labs  Lab 05/23/18 1953 05/23/18 2212 05/24/18 0122 05/25/18 0407  PROCALCITON <0.10  --  <0.10 <0.10  LATICACIDVEN  --  1.1 0.8  --     Recent Results (from the past 240 hour(s))  MRSA PCR Screening     Status: None   Collection Time: 05/23/18  9:55 PM  Result Value Ref Range Status   MRSA by PCR NEGATIVE NEGATIVE Final    Comment:        The GeneXpert MRSA Assay (FDA approved for NASAL specimens only), is one component of a comprehensive MRSA colonization surveillance program. It is not intended to diagnose MRSA infection nor to guide or monitor treatment for MRSA infections. Performed at [facility], [address], [place], [place] [address]   Culture, blood (routine x 2)  Call MD if unable to obtain prior to antibiotics being given     Status: None   Collection Time: 05/23/18 10:12 PM  Result Value Ref Range Status   Specimen Description BLOOD LEFT HAND  Final   Special Requests   Final    BOTTLES DRAWN AEROBIC AND ANAEROBIC Blood Culture adequate volume   Culture   Final    NO GROWTH 5 DAYS Performed at [facility], [address], [place], [place] [address]    Report Status 05/28/2018 FINAL  Final  Culture, blood (routine x 2) Call MD if unable to obtain prior to antibiotics being given     Status: None  Collection Time: 05/23/18 10:18 PM  Result Value Ref Range Status   Specimen Description BLOOD LEFT HAND  Final   Special Requests   Final    BOTTLES DRAWN AEROBIC AND ANAEROBIC Blood Culture adequate volume   Culture   Final    NO GROWTH 5 DAYS Performed at [facility], [address] [place], [place] [address]    Report Status 05/28/2018 FINAL  Final  Culture, respiratory     Status: None   Collection Time: 05/25/18  1:09 AM  Result Value Ref Range Status   Specimen Description   Final    SPUTUM Performed at [facility], [address] [place], [place] [address]    Special Requests   Final    NONE Performed at [facility], [address] [place], [place] [address]    Gram Stain   Final    RARE WBC PRESENT,BOTH PMN AND MONONUCLEAR RARE GRAM POSITIVE RODS RARE BUDDING YEAST SEEN RARE SQUAMOUS EPITHELIAL CELLS PRESENT Performed at [facility], [address] [place], [place] [address]    Culture FEW CANDIDA DUBLINIENSIS  Final   Report Status 05/27/2018 FINAL  Final     Radiology Studies: No results found. Scheduled Meds: . amLODipine  7.5 mg Oral Daily  . atorvastatin  40 mg Oral q1800  . divalproex  500 mg Oral BID  . enoxaparin (LOVENOX) injection  40 mg Subcutaneous Q24H  . Ferrous Fumarate  1 tablet Oral BID  . insulin aspart  0-5 Units Subcutaneous QHS  . insulin aspart  0-9 Units Subcutaneous TID WC  .  ipratropium-albuterol  3 mL Nebulization Q6H WA  . levETIRAcetam  1,500 mg Oral BID  . linaclotide  145 mcg Oral Daily  . lisinopril  40 mg Oral Daily  . LORazepam  0.25 mg Oral QHS  . mouth rinse  15 mL Mouth Rinse BID  . metoprolol tartrate  50 mg Oral BID  . montelukast  10 mg Oral QHS  . oxybutynin  5 mg Oral BID  . potassium chloride  40 mEq Oral Once  . topiramate  50 mg Oral BID  . venlafaxine XR  112.5 mg Oral Daily   Continuous Infusions: . ceFEPime (MAXIPIME) IV 1 g (05/29/18 1248)     LOS: 6 days  \ Erick BlinksJehanzeb Dyllen Menning, MD [facility] Pager [phone]  If 7PM-7AM, please contact night-coverage [email or web address] Password [email or web address] 05/29/2018, 3:25 PM

## 2018-05-29 NOTE — Progress Notes (Signed)
Patient capillary blood glucose was 120. Value did not gross over from glucose machine.

## 2018-05-30 ENCOUNTER — Inpatient Hospital Stay (HOSPITAL_COMMUNITY): Payer: Medicare Other

## 2018-05-30 LAB — COMPREHENSIVE METABOLIC PANEL
ALBUMIN: 2.6 g/dL — AB (ref 3.5–5.0)
ALT: 19 U/L (ref 0–44)
AST: 21 U/L (ref 15–41)
Alkaline Phosphatase: 45 U/L (ref 38–126)
Anion gap: 5 (ref 5–15)
BILIRUBIN TOTAL: 0.5 mg/dL (ref 0.3–1.2)
BUN: 14 mg/dL (ref 6–20)
CO2: 21 mmol/L — ABNORMAL LOW (ref 22–32)
Calcium: 8.6 mg/dL — ABNORMAL LOW (ref 8.9–10.3)
Chloride: 118 mmol/L — ABNORMAL HIGH (ref 98–111)
Creatinine, Ser: 0.39 mg/dL — ABNORMAL LOW (ref 0.44–1.00)
GFR calc Af Amer: >60 mL/min (ref >60)
GFR calc non Af Amer: >60 mL/min (ref >60)
Glucose, Bld: 119 mg/dL — ABNORMAL HIGH (ref 70–99)
Potassium: 3.3 mmol/L — ABNORMAL LOW (ref 3.5–5.1)
Sodium: 144 mmol/L (ref 135–145)
TOTAL PROTEIN: 6.8 g/dL (ref 6.5–8.1)

## 2018-05-30 LAB — CBC WITH DIFFERENTIAL/PLATELET
Abs Immature Granulocytes: 0.08 10*3/uL — ABNORMAL HIGH (ref 0.00–0.07)
Basophils Absolute: 0.1 10*3/uL (ref 0.0–0.1)
Basophils Relative: 1 %
Eosinophils Absolute: 0.2 10*3/uL (ref 0.0–0.5)
Eosinophils Relative: 3 %
HCT: 39.6 % (ref 36.0–46.0)
Hemoglobin: 13.2 g/dL (ref 12.0–15.0)
Immature Granulocytes: 1 %
Lymphocytes Relative: 33 %
Lymphs Abs: 2.4 10*3/uL (ref 0.7–4.0)
MCH: 27.3 pg (ref 26.0–34.0)
MCHC: 33.3 g/dL (ref 30.0–36.0)
MCV: 82 fL (ref 80.0–100.0)
Monocytes Absolute: 1 10*3/uL (ref 0.1–1.0)
Monocytes Relative: 14 %
Neutro Abs: 3.6 10*3/uL (ref 1.7–7.7)
Neutrophils Relative %: 48 %
Platelets: 302 10*3/uL (ref 150–400)
RBC: 4.83 MIL/uL (ref 3.87–5.11)
RDW: 14.7 % (ref 11.5–15.5)
WBC: 7.4 10*3/uL (ref 4.0–10.5)
nRBC: 0 % (ref 0.0–0.2)

## 2018-05-30 LAB — BRAIN NATRIURETIC PEPTIDE: B Natriuretic Peptide: 31 pg/mL (ref 0.0–100.0)

## 2018-05-30 LAB — GLUCOSE, CAPILLARY
Glucose-Capillary: 103 mg/dL — ABNORMAL HIGH (ref 70–99)
Glucose-Capillary: 121 mg/dL — ABNORMAL HIGH (ref 70–99)
Glucose-Capillary: 133 mg/dL — ABNORMAL HIGH (ref 70–99)

## 2018-05-30 MED ORDER — SODIUM CHLORIDE 0.9 % IV SOLN: INTRAVENOUS | Status: DC

## 2018-05-30 MED ORDER — ACETYLCYSTEINE 20 % IN SOLN
4.0000 mL | Freq: Two times a day (BID) | RESPIRATORY_TRACT | Status: DC
  Administered 2018-05-30: 4 mL via RESPIRATORY_TRACT
  Filled 2018-05-30: qty 4

## 2018-05-30 MED ORDER — POTASSIUM CHLORIDE IN NACL 20-0.9 MEQ/L-% IV SOLN
INTRAVENOUS | Status: DC
  Administered 2018-05-30: 10:00:00 via INTRAVENOUS

## 2018-05-30 MED ORDER — ACETYLCYSTEINE 20 % IN SOLN
4.0000 mL | Freq: Two times a day (BID) | RESPIRATORY_TRACT | Status: DC
  Administered 2018-05-31 – 2018-06-04 (×10): 4 mL via RESPIRATORY_TRACT
  Filled 2018-05-30 (×9): qty 4

## 2018-05-30 MED ORDER — POTASSIUM CHLORIDE 10 MEQ/100ML IV SOLN
10.0000 meq | INTRAVENOUS | Status: AC
  Administered 2018-05-30 – 2018-05-31 (×4): 10 meq via INTRAVENOUS
  Filled 2018-05-30 (×4): qty 100

## 2018-05-30 MED ORDER — FUROSEMIDE 10 MG/ML IJ SOLN
40.0000 mg | Freq: Once | INTRAMUSCULAR | Status: AC
  Administered 2018-05-30: 40 mg via INTRAVENOUS
  Filled 2018-05-30: qty 4

## 2018-05-30 NOTE — Clinical Social Work Note (Signed)
LCSW following. Pt discussed in Progression today. Pt is not yet stable for dc. Plan is for return to [facility] long term care when pt is medically stable. Will follow.

## 2018-05-30 NOTE — Progress Notes (Signed)
PROGRESS NOTE    Tami AmassSharon L Nolan  ZOX:[id number] DOB: 02/03/1961 DOA: 05/23/2018 PCP: Avon GullyFanta, Tesfaye, MD    Brief Narrative:  57 year old female with a history of bipolar disorder, hypertension, multiple sclerosis, who is a resident of a skilled nursing facility, admitted to the hospital with shortness of breath, hypoxia and fevers.  Found to have healthcare associated pneumonia and influenza A.  Currently on IV antibiotics and Tamiflu.  She also has significant hypoxia and is on supplemental oxygen.  Assessment & Plan:   Principal Problem:   HCAP (healthcare-associated pneumonia) Active Problems:   Hypokalemia   History of multiple sclerosis   Seizure disorder (HCC)   SIRS (systemic inflammatory response syndrome) (HCC)   Influenza A   Acute respiratory failure with hypoxia (HCC)   1. Acute respiratory failure with hypoxia.  Secondary to pneumonia and influenza.  Patient remains on high flow nasal cannula.  She is now down to 5 L.  Appears to have more crackles today.  Chest x-ray repeated indicated persistent pneumonia and possibly some element of CHF.  Will check BNP.  Give 1 dose of Lasix.  Family has requested that Dr. Juanetta GoslingHawkins evaluate the patient. 2. Healthcare associated pneumonia.  Currently on cefepime.  MRSA PCR is negative, vancomycin discontinued.  Continue current treatments.  Continues to have bilateral rhonchi.  We will add Mucomyst nebs and chest percussion 3. Sepsis - resolved.  4. Influenza A.  Completed a course of Tamiflu.  She is not having fevers.  Continue supportive care.  5. Hypokalemia. replace 6. Seizure disorder.  Continue on Keppra 7. Bipolar disorder.  Continue on Depakote.   8. Hypertension.  Stable on amlodipine, lisinopril and metoprolol. 9. Hyperlipidemia.  Continue statin 10. Multiple sclerosis.  Resume home medications on discharge.  DVT prophylaxis: lovenox Code Status: full code Family Communication: no family present Disposition Plan:  discharge back to SNF when stable   Consultants:     Procedures:     Antimicrobials:   Cefepime 12/19>  Vancomycin 12/19>12/20   Subjective: Patient is requiring increasing amounts of oxygen as compared to yesterday.  She continues to have a productive cough.  More short of breath today.  Objective: Vitals:   05/30/18 0600 05/30/18 0737 05/30/18 1422 05/30/18 1434  BP:    92/72  Pulse:    93  Resp:    18  Temp:    98.3 F (36.8 C)  TempSrc:    Oral  SpO2: 92% 93% 97% 90%  Weight:      Height:        Intake/Output Summary (Last 24 hours) at 05/30/2018 2001 Last data filed at 05/30/2018 1500 Gross per 24 hour  Intake 913.71 ml  Output -  Net 913.71 ml   Filed Weights   05/23/18 1945 05/23/18 2207  Weight: 97.1 kg 83.8 kg    Examination:  General exam: Alert, awake, no distress Respiratory system: Bilateral rhonchi. Respiratory effort normal. Cardiovascular system:RRR. No murmurs, rubs, gallops. Gastrointestinal system: Abdomen is nondistended, soft and nontender. No organomegaly or masses felt. Normal bowel sounds heard. Central nervous system: No focal neurological deficits. Extremities: No C/C/E, +pedal pulses Skin: No rashes, lesions or ulcers Psychiatry: Confused, pleasant.     Data Reviewed: I have personally reviewed following labs and imaging studies  CBC: Recent Labs  Lab 05/24/18 0122 05/25/18 0407 05/27/18 0555 05/28/18 0704 05/29/18 0654 05/30/18 0527  WBC 7.8 8.0 7.2 7.5 7.2 7.4  NEUTROABS 6.4  --  3.5 3.6 4.0 3.6  HGB  13.6 13.2 12.8 13.7 14.0 13.2  HCT 40.3 39.0 38.1 40.8 41.8 39.6  MCV 81.7 82.1 83.2 81.8 82.9 82.0  PLT 165 166 202 245 295 302   Basic Metabolic Panel: Recent Labs  Lab 05/26/18 0533 05/27/18 0555 05/28/18 0704 05/29/18 0654 05/30/18 0527  NA 142 142 142 143 144  K 3.3* 3.4* 3.6 3.3* 3.3*  CL 112* 113* 113* 112* 118*  CO2 23 23 23 24  21*  GLUCOSE 121* 114* 104* 124* 119*  BUN 6 11 8 10 14     CREATININE 0.36* 0.32* 0.40* 0.54 0.39*  CALCIUM 8.3* 8.2* 8.4* 8.5* 8.6*  MG 1.9  --   --   --   --    GFR: Estimated Creatinine Clearance: 85.3 mL/min (A) (by C-G formula based on SCr of 0.39 mg/dL (L)). Liver Function Tests: Recent Labs  Lab 05/24/18 0122 05/27/18 0555 05/28/18 0704 05/29/18 0654 05/30/18 0527  AST 23 24 24 26 21   ALT 17 16 18 20 19   ALKPHOS 43 39 44 48 45  BILITOT 0.8 0.7 0.7 0.7 0.5  PROT 6.4* 6.0* 6.9 7.0 6.8  ALBUMIN 2.6* 2.3* 2.6* 2.7* 2.6*   No results for input(s): LIPASE, AMYLASE in the last 168 hours. No results for input(s): AMMONIA in the last 168 hours. Coagulation Profile: No results for input(s): INR, PROTIME in the last 168 hours. Cardiac Enzymes: No results for input(s): CKTOTAL, CKMB, CKMBINDEX, TROPONINI in the last 168 hours. BNP (last 3 results) No results for input(s): PROBNP in the last 8760 hours. HbA1C: No results for input(s): HGBA1C in the last 72 hours. CBG: Recent Labs  Lab 05/29/18 1643 05/29/18 2155 05/30/18 0739 05/30/18 1132 05/30/18 1631  GLUCAP 125* 113* 103* 121* 133*   Lipid Profile: No results for input(s): CHOL, HDL, LDLCALC, TRIG, CHOLHDL, LDLDIRECT in the last 72 hours. Thyroid Function Tests: No results for input(s): TSH, T4TOTAL, FREET4, T3FREE, THYROIDAB in the last 72 hours. Anemia Panel: No results for input(s): VITAMINB12, FOLATE, FERRITIN, TIBC, IRON, RETICCTPCT in the last 72 hours. Sepsis Labs: Recent Labs  Lab 05/23/18 2212 05/24/18 0122 05/25/18 0407  PROCALCITON  --  <0.10 <0.10  LATICACIDVEN 1.1 0.8  --     Recent Results (from the past 240 hour(s))  MRSA PCR Screening     Status: None   Collection Time: 05/23/18  9:55 PM  Result Value Ref Range Status   MRSA by PCR NEGATIVE NEGATIVE Final    Comment:        The GeneXpert MRSA Assay (FDA approved for NASAL specimens only), is one component of a comprehensive MRSA colonization surveillance program. It is not intended to  diagnose MRSA infection nor to guide or monitor treatment for MRSA infections. Performed at [facility], [address], [place], [place] [address]   Culture, blood (routine x 2) Call MD if unable to obtain prior to antibiotics being given     Status: None   Collection Time: 05/23/18 10:12 PM  Result Value Ref Range Status   Specimen Description BLOOD LEFT HAND  Final   Special Requests   Final    BOTTLES DRAWN AEROBIC AND ANAEROBIC Blood Culture adequate volume   Culture   Final    NO GROWTH 5 DAYS Performed at [facility], [address], [place], [place] [address]    Report Status 05/28/2018 FINAL  Final  Culture, blood (routine x 2) Call MD if unable to obtain prior to antibiotics being given     Status: None  Collection Time: 05/23/18 10:18 PM  Result Value Ref Range Status   Specimen Description BLOOD LEFT HAND  Final   Special Requests   Final    BOTTLES DRAWN AEROBIC AND ANAEROBIC Blood Culture adequate volume   Culture   Final    NO GROWTH 5 DAYS Performed at [facility], [address], [place], [place] [address]    Report Status 05/28/2018 FINAL  Final  Culture, respiratory     Status: None   Collection Time: 05/25/18  1:09 AM  Result Value Ref Range Status   Specimen Description   Final    SPUTUM Performed at [facility], [address] [place], [place] [address]    Special Requests   Final    NONE Performed at [facility], [address], [place], [place] [address]    Gram Stain   Final    RARE WBC PRESENT,BOTH PMN AND MONONUCLEAR RARE GRAM POSITIVE RODS RARE BUDDING YEAST SEEN RARE SQUAMOUS EPITHELIAL CELLS PRESENT Performed at [facility], [address] [place], [place] [address]    Culture FEW CANDIDA DUBLINIENSIS  Final   Report Status 05/27/2018 FINAL  Final     Radiology Studies: Dg Chest Port 1 View  Result Date: 05/30/2018 CLINICAL DATA:  Fever with shortness of breath EXAM: PORTABLE CHEST 1 VIEW COMPARISON:  May 27, 2018  FINDINGS: There is cardiomegaly with pulmonary venous hypertension. There is widespread airspace consolidation in both mid and lower lung zones. There are pleural effusions bilaterally. There is a degree of interstitial pulmonary edema. IMPRESSION: Persistent pulmonary vascular congestion with pleural effusions bilaterally. There is interstitial pulmonary edema. Airspace consolidation in both mid and lower lung zones may represent alveolar edema; pneumonia cannot be excluded in the lower lung zones, however. There may be both alveolar edema and pneumonia in the bases. The overall appearance does indicate a degree of congestive heart failure. Electronically Signed   By: Bretta Bang III M.D.   On: 05/30/2018 19:47   Scheduled Meds: . acetylcysteine  4 mL Nebulization BID  . amLODipine  7.5 mg Oral Daily  . atorvastatin  40 mg Oral q1800  . divalproex  500 mg Oral BID  . enoxaparin (LOVENOX) injection  40 mg Subcutaneous Q24H  . Ferrous Fumarate  1 tablet Oral BID  . furosemide  40 mg Intravenous Once  . insulin aspart  0-5 Units Subcutaneous QHS  . insulin aspart  0-9 Units Subcutaneous TID WC  . ipratropium-albuterol  3 mL Nebulization Q6H WA  . levETIRAcetam  1,500 mg Oral BID  . linaclotide  145 mcg Oral Daily  . LORazepam  0.25 mg Oral QHS  . mouth rinse  15 mL Mouth Rinse BID  . metoprolol tartrate  50 mg Oral BID  . montelukast  10 mg Oral QHS  . oxybutynin  5 mg Oral BID  . topiramate  50 mg Oral BID  . venlafaxine XR  112.5 mg Oral Daily   Continuous Infusions: . ceFEPime (MAXIPIME) IV Stopped (05/30/18 1418)     LOS: 7 days  \ Erick Blinks, MD [facility] Pager [phone]  If 7PM-7AM, please contact night-coverage [email or web address] Password [email or web address] 05/30/2018, 8:01 PM

## 2018-05-31 LAB — GLUCOSE, CAPILLARY
Glucose-Capillary: 164 mg/dL — ABNORMAL HIGH (ref 70–99)
Glucose-Capillary: 111 mg/dL — ABNORMAL HIGH (ref 70–99)
Glucose-Capillary: 128 mg/dL — ABNORMAL HIGH (ref 70–99)
Glucose-Capillary: 132 mg/dL — ABNORMAL HIGH (ref 70–99)
Glucose-Capillary: 152 mg/dL — ABNORMAL HIGH (ref 70–99)

## 2018-05-31 LAB — BASIC METABOLIC PANEL
Anion gap: 8 (ref 5–15)
BUN: 12 mg/dL (ref 6–20)
CO2: 22 mmol/L (ref 22–32)
Calcium: 8.5 mg/dL — ABNORMAL LOW (ref 8.9–10.3)
Chloride: 115 mmol/L — ABNORMAL HIGH (ref 98–111)
Creatinine, Ser: 0.4 mg/dL — ABNORMAL LOW (ref 0.44–1.00)
GFR calc Af Amer: >60 mL/min (ref >60)
GFR calc non Af Amer: >60 mL/min (ref >60)
GLUCOSE: 120 mg/dL — AB (ref 70–99)
Potassium: 3.7 mmol/L (ref 3.5–5.1)
Sodium: 145 mmol/L (ref 135–145)

## 2018-05-31 NOTE — [place] FL2 (Signed)
[place] MEDICAID FL2 LEVEL OF CARE SCREENING TOOL     IDENTIFICATION  Patient Name: Tami AmassSharon L Nolan Birthdate: 08/06/1960 Sex: female Admission Date (Current Location): 05/23/2018  [facility] [place]   Facility and Address:  [facility],  [address], [place] [id number]      Provider Number: [id number]  Attending Physician Name and Address:  Erick BlinksMemon, Jehanzeb, MD  Relative Name and Phone Number:       Current Level of Care: Hospital Recommended Level of Care: Skilled Nursing Facility Prior Approval Number:    Date Approved/Denied:   PASRR Number:    Discharge Plan: SNF    Current Diagnoses: Patient Active Problem List   Diagnosis Date Noted  . Influenza A 05/24/2018  . Acute respiratory failure with hypoxia (HCC) 05/24/2018  . HCAP (healthcare-associated pneumonia) 05/23/2018  . SIRS (systemic inflammatory response syndrome) (HCC) 05/23/2018  . Constipation 03/26/2014  . Encounter for screening colonoscopy 03/26/2014  . Acute encephalopathy 07/06/2013  . History of multiple sclerosis 07/06/2013  . Seizure disorder (HCC) 07/06/2013  . Sinus tachycardia 07/06/2013  . Nicotine abuse 04/06/2012  . Stroke (HCC) 04/05/2012  . Rhabdomyolysis 04/05/2012  . Hypokalemia 04/05/2012    Orientation RESPIRATION BLADDER Height & Weight     Self  O2(see dc summary) Incontinent Weight: 184 lb 11.9 oz (83.8 kg) Height:  5\' 7"  (170.2 cm)  BEHAVIORAL SYMPTOMS/MOOD NEUROLOGICAL BOWEL NUTRITION STATUS      Incontinent Diet(see dc summary)  AMBULATORY STATUS COMMUNICATION OF NEEDS Skin   Extensive Assist Verbally Normal                       Personal Care Assistance Level of Assistance  Bathing, Feeding, Dressing Bathing Assistance: Maximum assistance Feeding assistance: Limited assistance Dressing Assistance: Maximum assistance     Functional Limitations Info  Sight, Hearing, Speech Sight Info: Adequate Hearing Info:  Adequate Speech Info: Impaired    SPECIAL CARE FACTORS FREQUENCY                       Contractures Contractures Info: Not present    Additional Factors Info  Code Status, Allergies, Psychotropic Code Status Info: full Allergies Info: NKA Psychotropic Info: Ativan, Effexor         Current Medications (05/31/2018):  This is the current hospital active medication list Current Facility-Administered Medications  Medication Dose Route Frequency Provider Last Rate Last Dose  . acetaminophen (TYLENOL) tablet 650 mg  650 mg Oral Q6H PRN Erick BlinksMemon, Jehanzeb, MD   650 mg at 05/31/18 0934  . acetylcysteine (MUCOMYST) 20 % nebulizer / oral solution 4 mL  4 mL Nebulization BID Erick BlinksMemon, Jehanzeb, MD   4 mL at 05/31/18 0730  . albuterol (PROVENTIL) (2.5 MG/3ML) 0.083% nebulizer solution 2.5 mg  2.5 mg Nebulization Q2H PRN Tarry Kosavid, Rachal A, MD      . amLODipine (NORVASC) tablet 7.5 mg  7.5 mg Oral Daily Memon, Durward MallardJehanzeb, MD   7.5 mg at 05/29/18 1020  . atorvastatin (LIPITOR) tablet 40 mg  40 mg Oral q1800 Erick BlinksMemon, Jehanzeb, MD   40 mg at 05/30/18 1747  . ceFEPIme (MAXIPIME) 1 g in sodium chloride 0.9 % 100 mL IVPB  1 g Intravenous Q8H Tarry Kosavid, Rachal A, MD 200 mL/hr at 05/31/18 0517 1 g at 05/31/18 0517  . divalproex (DEPAKOTE SPRINKLE) capsule 500 mg  500 mg Oral BID Erick BlinksMemon, Jehanzeb, MD   500 mg at 05/31/18 0947  . enoxaparin (  LOVENOX) injection 40 mg  40 mg Subcutaneous Q24H Tarry Kos A, MD   40 mg at 05/30/18 2253  . Ferrous Fumarate (HEMOCYTE - 106 mg FE) tablet 106 mg of iron  1 tablet Oral BID Erick Blinks, MD   106 mg of iron at 05/31/18 0934  . insulin aspart (novoLOG) injection 0-5 Units  0-5 Units Subcutaneous QHS Memon, Durward Mallard, MD      . insulin aspart (novoLOG) injection 0-9 Units  0-9 Units Subcutaneous TID WC Erick Blinks, MD   1 Units at 05/30/18 1219  . ipratropium-albuterol (DUONEB) 0.5-2.5 (3) MG/3ML nebulizer solution 3 mL  3 mL Nebulization Q6H WA Johnson, Clanford L, MD    3 mL at 05/31/18 0731  . levETIRAcetam (KEPPRA) 100 MG/ML solution 1,500 mg  1,500 mg Oral BID Erick Blinks, MD   1,500 mg at 05/31/18 0946  . linaclotide (LINZESS) capsule 145 mcg  145 mcg Oral Daily Erick Blinks, MD   145 mcg at 05/31/18 0934  . LORazepam (ATIVAN) tablet 0.25 mg  0.25 mg Oral QHS Erick Blinks, MD   0.25 mg at 05/30/18 2252  . MEDLINE mouth rinse  15 mL Mouth Rinse BID Tarry Kos A, MD   15 mL at 05/31/18 0935  . metoprolol tartrate (LOPRESSOR) tablet 50 mg  50 mg Oral BID Haydee Monica, MD   50 mg at 05/30/18 2252  . montelukast (SINGULAIR) tablet 10 mg  10 mg Oral QHS Erick Blinks, MD   10 mg at 05/30/18 2252  . oxybutynin (DITROPAN) tablet 5 mg  5 mg Oral BID Erick Blinks, MD   5 mg at 05/31/18 0934  . topiramate (TOPAMAX) tablet 50 mg  50 mg Oral BID Erick Blinks, MD   50 mg at 05/31/18 0933  . venlafaxine XR (EFFEXOR-XR) 24 hr capsule 112.5 mg  112.5 mg Oral Daily Erick Blinks, MD   112.5 mg at 05/31/18 5638     Discharge Medications: Please see discharge summary for a list of discharge medications.  Relevant Imaging Results:  Relevant Lab Results:   Additional Information    Elliot Gault, LCSW

## 2018-05-31 NOTE — Progress Notes (Signed)
Patients CBG was 164. Value did not transfer over from glucose machine.

## 2018-05-31 NOTE — Progress Notes (Signed)
Initial Nutrition Assessment  DOCUMENTATION CODES:  Not applicable  INTERVENTION:  Vital Shake with meals, each supplement provides 480-500 kcals and 20-23 grams of protein. This supplement is nectar thick at baseline and will not require thickening prior to administration   NUTRITION DIAGNOSIS:  Inadequate oral intake related to lethargy/confusion, acute illness as evidenced by eating </= to 50% of all but two meals.   GOAL:  Patient will meet greater than or equal to 90% of their needs  MONITOR:  PO intake, Supplement acceptance, Labs, I & O's  REASON FOR ASSESSMENT:  LOS    ASSESSMENT:  57 y/o female PMHx of bipolar disorder, dementia, CHF, MS, MDD/anxiety, etoh abuse, CVA, DM2, HTN. She is a long term resident of SNF. Sent from facility d/t SOB and hypoxia. Work up revealed bilateral PNA and postive influenza. Admitted for management.   Patient discussed in Progression. She has been slow to improve from her PNA despite optimal medical management per pulm. RD noted pts intake appear suboptimal.   Per chart, pts meal intake has ranged from 30-50%. From available documentation, she has eaten >50% only 2x.   RD attempted to elicit information from patient. She responds to all questions with either "yes" or "no". However, question accuracy of responses as she said "yes" when asked if she could feed herself, but multiple staff members say this is not accurate. Her NT says that pt ate well when she last worked with her on Monday. This is the day she DID eat >50% of a couple meals.    Bed weight today shows weight of 193 lbs, still 8 lbs above admit weight. There are recent weights in chart to compare those from this admission to. Last available are from 2015  Given apparent suboptimal intake, will add vital shakes to her meals. She is on NTL consistencies, but this supplement is naturally Nectar thick.   Labs: Recent BGs 100-130, Albumin:2.6, k:3.3 Meds: Iron, Insulin,Linzess, Ativan,  IV abx, IVF   Recent Labs  Lab 05/26/18 0533  05/29/18 0654 05/30/18 0527 05/31/18 0453  NA 142   < > 143 144 145  K 3.3*   < > 3.3* 3.3* 3.7  CL 112*   < > 112* 118* 115*  CO2 23   < > 24 21* 22  BUN 6   < > 10 14 12   CREATININE 0.36*   < > 0.54 0.39* 0.40*  CALCIUM 8.3*   < > 8.5* 8.6* 8.5*  MG 1.9  --   --   --   --   GLUCOSE 121*   < > 124* 119* 120*   < > = values in this interval not displayed.   NUTRITION - FOCUSED PHYSICAL EXAM:   Most Recent Value  Orbital Region  No depletion  Upper Arm Region  No depletion  Thoracic and Lumbar Region  No depletion  Buccal Region  No depletion  Temple Region  No depletion  Clavicle Bone Region  No depletion  Clavicle and Acromion Bone Region  No depletion  Scapular Bone Region  No depletion  Dorsal Hand  No depletion  Patellar Region  No depletion  Anterior Thigh Region  No depletion  Posterior Calf Region  No depletion  Edema (RD Assessment)  Mild  Hair  Reviewed  Eyes  Reviewed  Mouth  Reviewed  Skin  Reviewed  Nails  Reviewed     Diet Order:   Diet Order  DIET - DYS 1 Room service appropriate? Yes; Fluid consistency: Nectar Thick  Diet effective now             EDUCATION NEEDS:  No education needs have been identified at this time  Skin:  Skin Assessment: Reviewed RN Assessment  Last BM:  12/25  Height:  Ht Readings from Last 1 Encounters:  05/23/18 5\' 7"  (1.702 m)   Weight:  Wt Readings from Last 1 Encounters:  05/23/18 83.8 kg   Wt Readings from Last 10 Encounters:  05/23/18 83.8 kg  08/23/14 91.2 kg  05/04/14 91.2 kg  07/08/13 91.6 kg  03/19/13 90.7 kg  12/26/12 90.7 kg  08/23/12 90.9 kg  08/01/12 95.3 kg  04/18/12 71.8 kg  02/26/12 73 kg   Ideal Body Weight:  55.2 kg(Adjusted -10% d/t bed bound state)  BMI:  Body mass index is 28.94 kg/m.  Estimated Nutritional Needs:  Kcal:  1700-1900 kcals (20-23 kcal/kg bw) Protein:  77-88g Pro (1.4-1.6g/kg bw) Fluid:  1.7-1.9 L fluid  (58ml/kcal)  Christophe Louis RD, LDN, CNSC Clinical Nutrition Available Tues-Sat via Pager: [phone] 05/31/2018 1:27 PM

## 2018-05-31 NOTE — Clinical Social Work Note (Signed)
LCSW following. Pt discussed in Progression today. Per MD, he is not anticipating that pt will be stable for dc over the weekend. Pt will be [facility] when she is clear for dc. Weekend CSW will be available if needed. Covering CSW will follow up next week if pt remains hospitalized at that time.

## 2018-05-31 NOTE — Progress Notes (Signed)
PROGRESS NOTE    Tami Nolan  ZOX:[id number] DOB: 1960-07-03 DOA: 05/23/2018 PCP: [name], MD    Brief Narrative:  57 year old female with a history of bipolar disorder, hypertension, multiple sclerosis, who is a resident of a skilled nursing facility, admitted to the hospital with shortness of breath, hypoxia and fevers.  Found to have healthcare associated pneumonia and influenza A.  Currently on IV antibiotics and Tamiflu.  She also has significant hypoxia and is on supplemental oxygen.  Assessment & Plan:   Principal Problem:   HCAP (healthcare-associated pneumonia) Active Problems:   Hypokalemia   History of multiple sclerosis   Seizure disorder (HCC)   SIRS (systemic inflammatory response syndrome) (HCC)   Influenza A   Acute respiratory failure with hypoxia (HCC)   1. Acute respiratory failure with hypoxia.  Secondary to pneumonia and influenza.  Patient remains on high flow nasal cannula.  She is now down to 5 L.  Continues to have bilateral crackles.  Chest x-ray repeated indicated persistent pneumonia and possibly some element of CHF.  BNP was normal.  Pulmonology input appreciated.  We will try and wean down oxygen as tolerated 2. Healthcare associated pneumonia.  Currently on cefepime.  MRSA PCR is negative, vancomycin discontinued.  Continue current treatments.  Continues to have bilateral rhonchi.  Continue pulmonary hygiene 3. Sepsis - resolved.  4. Influenza A.  Completed a course of Tamiflu.  She is not having fevers.  Continue supportive care.  5. Hypokalemia. replace 6. Seizure disorder.  Continue on Keppra 7. Bipolar disorder.  Continue on Depakote.   8. Hypertension.  Stable on amlodipine, lisinopril and metoprolol. 9. Hyperlipidemia.  Continue statin 10. Multiple sclerosis.  Resume home medications on discharge.  DVT prophylaxis: lovenox Code Status: full code Family Communication: no family present Disposition Plan: discharge back to SNF when  stable   Consultants:   Pulmonology  Procedures:     Antimicrobials:   Cefepime 12/19>  Vancomycin 12/19>12/20   Subjective: Still requiring large amounts of oxygen.  Has productive cough, but has very poor cough effort.  Objective: Vitals:   05/31/18 0548 05/31/18 0733 05/31/18 1408 05/31/18 1623  BP: 102/60   105/76  Pulse: 77   97  Resp: 19   20  Temp: 98.4 F (36.9 C)   98.6 F (37 C)  TempSrc: Oral   Oral  SpO2: 93% 92% 94% 93%  Weight:      Height:        Intake/Output Summary (Last 24 hours) at 05/31/2018 1803 Last data filed at 05/31/2018 1300 Gross per 24 hour  Intake 621.72 ml  Output 1000 ml  Net -378.28 ml   Filed Weights   05/23/18 1945 05/23/18 2207  Weight: 97.1 kg 83.8 kg    Examination:  General exam: Alert, awake, no distress Respiratory system: Bilateral rhonchi. Respiratory effort normal. Cardiovascular system:RRR. No murmurs, rubs, gallops. Gastrointestinal system: Abdomen is nondistended, soft and nontender. No organomegaly or masses felt. Normal bowel sounds heard. Central nervous system: No focal neurological deficits. Extremities: No C/C/E, +pedal pulses Skin: No rashes, lesions or ulcers Psychiatry: Confused, pleasant    Data Reviewed: I have personally reviewed following labs and imaging studies  CBC: Recent Labs  Lab 05/25/18 0407 05/27/18 0555 05/28/18 0704 05/29/18 0654 05/30/18 0527  WBC 8.0 7.2 7.5 7.2 7.4  NEUTROABS  --  3.5 3.6 4.0 3.6  HGB 13.2 12.8 13.7 14.0 13.2  HCT 39.0 38.1 40.8 41.8 39.6  MCV 82.1 83.2 81.8 82.9  82.0  PLT 166 202 245 295 302   Basic Metabolic Panel: Recent Labs  Lab 05/26/18 0533 05/27/18 0555 05/28/18 0704 05/29/18 0654 05/30/18 0527 05/31/18 0453  NA 142 142 142 143 144 145  K 3.3* 3.4* 3.6 3.3* 3.3* 3.7  CL 112* 113* 113* 112* 118* 115*  CO2 23 23 23 24  21* 22  GLUCOSE 121* 114* 104* 124* 119* 120*  BUN 6 11 8 10 14 12   CREATININE 0.36* 0.32* 0.40* 0.54 0.39* 0.40*   CALCIUM 8.3* 8.2* 8.4* 8.5* 8.6* 8.5*  MG 1.9  --   --   --   --   --    GFR: Estimated Creatinine Clearance: 85.3 mL/min (A) (by C-G formula based on SCr of 0.4 mg/dL (L)). Liver Function Tests: Recent Labs  Lab 05/27/18 0555 05/28/18 0704 05/29/18 0654 05/30/18 0527  AST 24 24 26 21   ALT 16 18 20 19   ALKPHOS 39 44 48 45  BILITOT 0.7 0.7 0.7 0.5  PROT 6.0* 6.9 7.0 6.8  ALBUMIN 2.3* 2.6* 2.7* 2.6*   No results for input(s): LIPASE, AMYLASE in the last 168 hours. No results for input(s): AMMONIA in the last 168 hours. Coagulation Profile: No results for input(s): INR, PROTIME in the last 168 hours. Cardiac Enzymes: No results for input(s): CKTOTAL, CKMB, CKMBINDEX, TROPONINI in the last 168 hours. BNP (last 3 results) No results for input(s): PROBNP in the last 8760 hours. HbA1C: No results for input(s): HGBA1C in the last 72 hours. CBG: Recent Labs  Lab 05/30/18 1631 05/30/18 2132 05/31/18 0906 05/31/18 1245 05/31/18 1626  GLUCAP 133* 164* 111* 128* 132*   Lipid Profile: No results for input(s): CHOL, HDL, LDLCALC, TRIG, CHOLHDL, LDLDIRECT in the last 72 hours. Thyroid Function Tests: No results for input(s): TSH, T4TOTAL, FREET4, T3FREE, THYROIDAB in the last 72 hours. Anemia Panel: No results for input(s): VITAMINB12, FOLATE, FERRITIN, TIBC, IRON, RETICCTPCT in the last 72 hours. Sepsis Labs: Recent Labs  Lab 05/25/18 0407  PROCALCITON <0.10    Recent Results (from the past 240 hour(s))  MRSA PCR Screening     Status: None   Collection Time: 05/23/18  9:55 PM  Result Value Ref Range Status   MRSA by PCR NEGATIVE NEGATIVE Final    Comment:        The GeneXpert MRSA Assay (FDA approved for NASAL specimens only), is one component of a comprehensive MRSA colonization surveillance program. It is not intended to diagnose MRSA infection nor to guide or monitor treatment for MRSA infections. Performed at [facility], [address] [place], [place]  [address]   Culture, blood (routine x 2) Call MD if unable to obtain prior to antibiotics being given     Status: None   Collection Time: 05/23/18 10:12 PM  Result Value Ref Range Status   Specimen Description BLOOD LEFT HAND  Final   Special Requests   Final    BOTTLES DRAWN AEROBIC AND ANAEROBIC Blood Culture adequate volume   Culture   Final    NO GROWTH 5 DAYS Performed at [facility], [address], [place], [place] [address]    Report Status 05/28/2018 FINAL  Final  Culture, blood (routine x 2) Call MD if unable to obtain prior to antibiotics being given     Status: None   Collection Time: 05/23/18 10:18 PM  Result Value Ref Range Status   Specimen Description BLOOD LEFT HAND  Final   Special Requests   Final    BOTTLES  DRAWN AEROBIC AND ANAEROBIC Blood Culture adequate volume   Culture   Final    NO GROWTH 5 DAYS Performed at [facility], [address] [place], [place] [address]    Report Status 05/28/2018 FINAL  Final  Culture, respiratory     Status: None   Collection Time: 05/25/18  1:09 AM  Result Value Ref Range Status   Specimen Description   Final    SPUTUM Performed at [facility], [address] [place], [place] [address]    Special Requests   Final    NONE Performed at [facility], [address] [place], [place] [address]    Gram Stain   Final    RARE WBC PRESENT,BOTH PMN AND MONONUCLEAR RARE GRAM POSITIVE RODS RARE BUDDING YEAST SEEN RARE SQUAMOUS EPITHELIAL CELLS PRESENT Performed at [facility], [address] [place], [place] [address]    Culture FEW CANDIDA DUBLINIENSIS  Final   Report Status 05/27/2018 FINAL  Final     Radiology Studies: Dg Chest Port 1 View  Result Date: 05/30/2018 CLINICAL DATA:  Fever with shortness of breath EXAM: PORTABLE CHEST 1 VIEW COMPARISON:  May 27, 2018 FINDINGS: There is cardiomegaly with pulmonary venous hypertension. There is widespread airspace consolidation in both mid and lower lung zones. There  are pleural effusions bilaterally. There is a degree of interstitial pulmonary edema. IMPRESSION: Persistent pulmonary vascular congestion with pleural effusions bilaterally. There is interstitial pulmonary edema. Airspace consolidation in both mid and lower lung zones may represent alveolar edema; pneumonia cannot be excluded in the lower lung zones, however. There may be both alveolar edema and pneumonia in the bases. The overall appearance does indicate a degree of congestive heart failure. Electronically Signed   By: Bretta BangWilliam  Woodruff III M.D.   On: 05/30/2018 19:47   Scheduled Meds: . acetylcysteine  4 mL Nebulization BID  . amLODipine  7.5 mg Oral Daily  . atorvastatin  40 mg Oral q1800  . divalproex  500 mg Oral BID  . enoxaparin (LOVENOX) injection  40 mg Subcutaneous Q24H  . Ferrous Fumarate  1 tablet Oral BID  . insulin aspart  0-5 Units Subcutaneous QHS  . insulin aspart  0-9 Units Subcutaneous TID WC  . ipratropium-albuterol  3 mL Nebulization Q6H WA  . levETIRAcetam  1,500 mg Oral BID  . linaclotide  145 mcg Oral Daily  . LORazepam  0.25 mg Oral QHS  . mouth rinse  15 mL Mouth Rinse BID  . metoprolol tartrate  50 mg Oral BID  . montelukast  10 mg Oral QHS  . oxybutynin  5 mg Oral BID  . topiramate  50 mg Oral BID  . venlafaxine XR  112.5 mg Oral Daily   Continuous Infusions: . ceFEPime (MAXIPIME) IV 1 g (05/31/18 1330)     LOS: 8 days  \ Erick BlinksJehanzeb Kyndle Schlender, MD [facility] Pager [phone]  If 7PM-7AM, please contact night-coverage [email or web address] Password [email or web address] 05/31/2018, 6:03 PM

## 2018-05-31 NOTE — Consult Note (Signed)
Consult requested by: [facility] hospitalist, Dr. Roderic Palau Consult requested for: Healthcare associated pneumonia  HPI: This is a 57 year old female who has history of bipolar disorder hypertension multiple sclerosis seizure disorder diabetes previous history of alcohol abuse and who is in the hospital with influenza A and healthcare associated pneumonia.  She had more trouble yesterday which prompted pulmonary consultation.  History is from the medical record.  Past Medical History:  Diagnosis Date  . Anxiety   . Bipolar disorder ([place])   . CHF (congestive heart failure) ([place])   . Colon polyps 1997  . Diabetes mellitus without complication ([place])   . ETOH abuse   . Genital warts   . Hepatitis    ETOH related   . History of electroencephalogram 08/2012   normal  . Hx of abnormal cervical Pap smear 1997  . Hypertension   . Hypokalemia   . Major depressive disorder   . Multiple sclerosis ([place])   . Rhabdomyolysis   . Stroke ([place])   . Vascular dementia ([place])      Family History  Problem Relation Age of Onset  . Coronary artery disease Brother   . Diabetes Brother   . Hypertension Brother   . Hypertension Mother   . Diabetes Mother   . Hypertension Sister   . Alcohol abuse Other        family history      Social History   Socioeconomic History  . Marital status: Single    Spouse name: Not on file  . Number of children: 2  . Years of education: Not on file  . Highest education level: Not on file  Occupational History  . Not on file  Social Needs  . Financial resource strain: Not on file  . Food insecurity:    Worry: Not on file    Inability: Not on file  . Transportation needs:    Medical: Not on file    Non-medical: Not on file  Tobacco Use  . Smoking status: Current Every Day Smoker    Packs/day: 1.00    Years: 15.00    Pack years: 15.00    Types: Cigarettes  . Smokeless tobacco: Current User  Substance and Sexual Activity  . Alcohol use: No    Comment: Hx of  ETOH abuse - dry for a couple of years.   . Drug use: No    Comment: No hx of illicit drugs   . Sexual activity: Not Currently  Lifestyle  . Physical activity:    Days per week: Not on file    Minutes per session: Not on file  . Stress: Not on file  Relationships  . Social connections:    Talks on phone: Not on file    Gets together: Not on file    Attends religious service: Not on file    Active member of club or organization: Not on file    Attends meetings of clubs or organizations: Not on file    Relationship status: Not on file  Other Topics Concern  . Not on file  Social History Narrative  . Not on file     ROS: Unable to obtain    Objective: Vital signs in last 24 hours: Temp:  [98.1 F (36.7 C)-98.4 F (36.9 C)] 98.4 F (36.9 C) (12/27 0548) Pulse Rate:  [66-93] 77 (12/27 0548) Resp:  [18-19] 19 (12/27 0548) BP: (92-141)/(60-83) 102/60 (12/27 0548) SpO2:  [88 %-97 %] 92 % (12/27 0733) Weight change:  Last BM Date: (  Unable to assess)  Intake/Output from previous day: 12/26 0701 - 12/27 0700 In: 1415.4 [P.O.:240; I.V.:423.8; IV Piggyback:751.6] Out: 1000 [Urine:1000]  PHYSICAL EXAM Constitutional: She is arousable and will answer questions one-word.  She looks a little short of breath at rest.  Eyes: Pupils are reactive ears nose mouth and throat: Mucous membranes are slightly dry.  Cardiovascular: Her heart is regular with normal heart sounds.  Respiratory: She has bilateral rhonchi.  Gastrointestinal: Her abdomen is soft without masses.  Neurological: Unable to assess but she does move all 4 extremities.  Psychiatric: Unable to assess.  Musculoskeletal: She is moving all 4 extremities and grossly normal strength.  Lab Results: Basic Metabolic Panel: Recent Labs    05/30/18 0527 05/31/18 0453  NA 144 145  K 3.3* 3.7  CL 118* 115*  CO2 21* 22  GLUCOSE 119* 120*  BUN 14 12  CREATININE 0.39* 0.40*  CALCIUM 8.6* 8.5*   Liver Function Tests: Recent  Labs    05/29/18 0654 05/30/18 0527  AST 26 21  ALT 20 19  ALKPHOS 48 45  BILITOT 0.7 0.5  PROT 7.0 6.8  ALBUMIN 2.7* 2.6*   No results for input(s): LIPASE, AMYLASE in the last 72 hours. No results for input(s): AMMONIA in the last 72 hours. CBC: Recent Labs    05/29/18 0654 05/30/18 0527  WBC 7.2 7.4  NEUTROABS 4.0 3.6  HGB 14.0 13.2  HCT 41.8 39.6  MCV 82.9 82.0  PLT 295 302   Cardiac Enzymes: No results for input(s): CKTOTAL, CKMB, CKMBINDEX, TROPONINI in the last 72 hours. BNP: No results for input(s): PROBNP in the last 72 hours. D-Dimer: No results for input(s): DDIMER in the last 72 hours. CBG: Recent Labs    05/29/18 1643 05/29/18 2155 05/30/18 0739 05/30/18 1132 05/30/18 1631 05/30/18 2132  GLUCAP 125* 113* 103* 121* 133* 164*   Hemoglobin A1C: No results for input(s): HGBA1C in the last 72 hours. Fasting Lipid Panel: No results for input(s): CHOL, HDL, LDLCALC, TRIG, CHOLHDL, LDLDIRECT in the last 72 hours. Thyroid Function Tests: No results for input(s): TSH, T4TOTAL, FREET4, T3FREE, THYROIDAB in the last 72 hours. Anemia Panel: No results for input(s): VITAMINB12, FOLATE, FERRITIN, TIBC, IRON, RETICCTPCT in the last 72 hours. Coagulation: No results for input(s): LABPROT, INR in the last 72 hours. Urine Drug Screen: Drugs of Abuse     Component Value Date/Time   LABOPIA NONE DETECTED 07/06/2013 1933   COCAINSCRNUR NONE DETECTED 07/06/2013 1933   LABBENZ NONE DETECTED 07/06/2013 1933   AMPHETMU NONE DETECTED 07/06/2013 1933   THCU NONE DETECTED 07/06/2013 1933   LABBARB NONE DETECTED 07/06/2013 1933    Alcohol Level: No results for input(s): ETH in the last 72 hours. Urinalysis: No results for input(s): COLORURINE, LABSPEC, PHURINE, GLUCOSEU, HGBUR, BILIRUBINUR, KETONESUR, PROTEINUR, UROBILINOGEN, NITRITE, LEUKOCYTESUR in the last 72 hours.  Invalid input(s): APPERANCEUR Misc. Labs:   ABGS: No results for input(s): PHART, PO2ART,  TCO2, HCO3 in the last 72 hours.  Invalid input(s): PCO2   MICROBIOLOGY: Recent Results (from the past 240 hour(s))  MRSA PCR Screening     Status: None   Collection Time: 05/23/18  9:55 PM  Result Value Ref Range Status   MRSA by PCR NEGATIVE NEGATIVE Final    Comment:        The GeneXpert MRSA Assay (FDA approved for NASAL specimens only), is one component of a comprehensive MRSA colonization surveillance program. It is not intended to diagnose MRSA infection nor to guide  or monitor treatment for MRSA infections. Performed at [facility], [address] [place], [place] [address]   Culture, blood (routine x 2) Call MD if unable to obtain prior to antibiotics being given     Status: None   Collection Time: 05/23/18 10:12 PM  Result Value Ref Range Status   Specimen Description BLOOD LEFT HAND  Final   Special Requests   Final    BOTTLES DRAWN AEROBIC AND ANAEROBIC Blood Culture adequate volume   Culture   Final    NO GROWTH 5 DAYS Performed at [facility], [address], [place], [place] [address]    Report Status 05/28/2018 FINAL  Final  Culture, blood (routine x 2) Call MD if unable to obtain prior to antibiotics being given     Status: None   Collection Time: 05/23/18 10:18 PM  Result Value Ref Range Status   Specimen Description BLOOD LEFT HAND  Final   Special Requests   Final    BOTTLES DRAWN AEROBIC AND ANAEROBIC Blood Culture adequate volume   Culture   Final    NO GROWTH 5 DAYS Performed at [facility], [address], [place], [place] [address]    Report Status 05/28/2018 FINAL  Final  Culture, respiratory     Status: None   Collection Time: 05/25/18  1:09 AM  Result Value Ref Range Status   Specimen Description   Final    SPUTUM Performed at [facility], [address], [place], [place] [address]    Special Requests   Final    NONE Performed at [facility], [address], [place], [place] [address]    Gram Stain   Final    RARE WBC PRESENT,BOTH  PMN AND MONONUCLEAR RARE GRAM POSITIVE RODS RARE BUDDING YEAST SEEN RARE SQUAMOUS EPITHELIAL CELLS PRESENT Performed at [facility], [place] [address], [place],  [address]    Culture FEW CANDIDA DUBLINIENSIS  Final   Report Status 05/27/2018 FINAL  Final    Studies/Results: Dg Chest Port 1 View  Result Date: 05/30/2018 CLINICAL DATA:  Fever with shortness of breath EXAM: PORTABLE CHEST 1 VIEW COMPARISON:  May 27, 2018 FINDINGS: There is cardiomegaly with pulmonary venous hypertension. There is widespread airspace consolidation in both mid and lower lung zones. There are pleural effusions bilaterally. There is a degree of interstitial pulmonary edema. IMPRESSION: Persistent pulmonary vascular congestion with pleural effusions bilaterally. There is interstitial pulmonary edema. Airspace consolidation in both mid and lower lung zones may represent alveolar edema; pneumonia cannot be excluded in the lower lung zones, however. There may be both alveolar edema and pneumonia in the bases. The overall appearance does indicate a degree of congestive heart failure. Electronically Signed   By: Lowella Grip III M.D.   On: 05/30/2018 19:47    Medications:  Prior to Admission:  Medications Prior to Admission  Medication Sig Dispense Refill Last Dose  . amLODipine (NORVASC) 2.5 MG tablet Take 7.5 mg by mouth daily.   05/23/2018 at 1000  . aspirin 325 MG EC tablet Take 81 mg by mouth daily.    05/23/2018 at 1000  . atorvastatin (LIPITOR) 40 MG tablet Take 40 mg by mouth daily.   Past Week at 2100  . Dimethyl Fumarate (TECFIDERA) 240 MG CPDR Take by mouth 2 (two) times daily.   05/23/2018 at 1000  . divalproex (DEPAKOTE SPRINKLE) 125 MG capsule Take 500 mg by mouth 2 (two) times daily.   05/23/2018 at 1000  . furosemide (  LASIX) 20 MG tablet Take 20 mg by mouth daily.   05/23/2018 at 1700  . ipratropium-albuterol (DUONEB) 0.5-2.5 (3) MG/3ML SOLN Take 3 mLs by nebulization every 6 (six)  hours as needed.   05/23/2018 at Unknown time  . levETIRAcetam (KEPPRA) 100 MG/ML solution Take 1,500 mg by mouth 2 (two) times daily.   05/23/2018 at 1000  . Linaclotide (LINZESS) 145 MCG CAPS capsule Take 1 capsule (145 mcg total) by mouth daily. 30 capsule 5 05/23/2018 at 1000  . loratadine (CLARITIN) 10 MG tablet Take 1 tablet (10 mg total) by mouth daily. 30 tablet 1 05/23/2018 at 1000  . LORazepam (ATIVAN) 2 MG tablet Take 0.25 mg by mouth at bedtime.    Past Week at 2100  . metoprolol tartrate (LOPRESSOR) 25 MG tablet Take 50 mg by mouth 2 (two) times daily.   05/23/2018 at 1000  . Multiple Vitamin (MULTIVITAMIN WITH MINERALS) TABS Take 1 tablet by mouth daily. 30 tablet 1 05/23/2018 at 1000  . oxybutynin (DITROPAN) 5 MG tablet Take 5 mg by mouth 2 (two) times daily.   05/23/2018 at 1000  . thiamine 100 MG tablet Take 1 tablet (100 mg total) by mouth daily. 30 tablet 3 05/23/2018 at 1000  . topiramate (TOPAMAX) 25 MG tablet Take 50 mg by mouth 2 (two) times daily.   05/23/2018 at 1000  . venlafaxine XR (EFFEXOR-XR) 150 MG 24 hr capsule Take 1 capsule (150 mg total) by mouth daily. (Patient taking differently: Take 112.5 mg by mouth daily. ) 30 capsule 1 05/23/2018 at 1000  . cyclobenzaprine (FLEXERIL) 10 MG tablet Take 1 tablet (10 mg total) by mouth 2 (two) times daily. (Patient not taking: Reported on 05/24/2018) 30 tablet 1 Not Taking at Unknown time  . levETIRAcetam (KEPPRA) 500 MG tablet Take 2 tablets (1,000 mg total) by mouth 2 (two) times daily. (Patient not taking: Reported on 05/24/2018) 60 tablet 1 Not Taking at Unknown time  . montelukast (SINGULAIR) 10 MG tablet Take 1 tablet (10 mg total) by mouth at bedtime. (Patient not taking: Reported on 05/24/2018) 30 tablet 1 Not Taking at Unknown time  . peg 3350 powder (MOVIPREP) 100 G SOLR Take 1 kit (200 g total) by mouth as directed. (Patient not taking: Reported on 05/24/2018) 1 kit 0 Not Taking at Unknown time  . simvastatin (ZOCOR)  40 MG tablet Take 1 tablet (40 mg total) by mouth at bedtime. (Patient not taking: Reported on 05/24/2018) 30 tablet 1 Not Taking at Unknown time  . sodium phosphate (FLEET) 7-19 GM/118ML ENEM Place 133 mLs (1 enema total) rectally once. (Patient not taking: Reported on 08/23/2014) 1 enema 0 Not Taking at Unknown time   Scheduled: . acetylcysteine  4 mL Nebulization BID  . amLODipine  7.5 mg Oral Daily  . atorvastatin  40 mg Oral q1800  . divalproex  500 mg Oral BID  . enoxaparin (LOVENOX) injection  40 mg Subcutaneous Q24H  . Ferrous Fumarate  1 tablet Oral BID  . insulin aspart  0-5 Units Subcutaneous QHS  . insulin aspart  0-9 Units Subcutaneous TID WC  . ipratropium-albuterol  3 mL Nebulization Q6H WA  . levETIRAcetam  1,500 mg Oral BID  . linaclotide  145 mcg Oral Daily  . LORazepam  0.25 mg Oral QHS  . mouth rinse  15 mL Mouth Rinse BID  . metoprolol tartrate  50 mg Oral BID  . montelukast  10 mg Oral QHS  . oxybutynin  5 mg Oral BID  .  topiramate  50 mg Oral BID  . venlafaxine XR  112.5 mg Oral Daily   Continuous: . ceFEPime (MAXIPIME) IV 1 g (05/31/18 0517)   OIN:[id number], albuterol  Assesment: She was admitted with healthcare associated pneumonia probably a complication of influenza A.  She had systemic inflammatory response syndrome which seems to have resolved.  She has acute hypoxic respiratory failure secondary to pneumonia and influenza.  Chest x-ray showed perhaps some element of heart failure.  I have personally reviewed the film.  She has received Lasix.  She has seizure disorder but no seizures recently  She has bipolar disease on Depakote.  She has multiple sclerosis which appears to be stable Principal Problem:   HCAP (healthcare-associated pneumonia) Active Problems:   Hypokalemia   History of multiple sclerosis   Seizure disorder ([facility])   SIRS (systemic inflammatory response syndrome) ([facility])   Influenza A   Acute respiratory failure with hypoxia  ([place])    Plan: I do not think there is much to add.  Agree the Lasix may have helped.  Continue other treatments.  Continue antibiotics.    LOS: 8 days   Alonza Bogus 05/31/2018, 8:29 AM

## 2018-06-01 LAB — GLUCOSE, CAPILLARY
Glucose-Capillary: 102 mg/dL — ABNORMAL HIGH (ref 70–99)
Glucose-Capillary: 144 mg/dL — ABNORMAL HIGH (ref 70–99)
Glucose-Capillary: 155 mg/dL — ABNORMAL HIGH (ref 70–99)
Glucose-Capillary: 136 mg/dL — ABNORMAL HIGH (ref 70–99)

## 2018-06-01 NOTE — Progress Notes (Signed)
Subjective: She is still not very communicative.  No new complaints noted  Objective: Vital signs in last 24 hours: Temp:  [97.6 F (36.4 C)-98.6 F (37 C)] 97.6 F (36.4 C) (12/28 0631) Pulse Rate:  [73-97] 73 (12/28 0631) Resp:  [20-24] 24 (12/27 2237) BP: (94-105)/(71-76) 94/71 (12/28 0631) SpO2:  [89 %-94 %] 90 % (12/28 0839) Weight change:  Last BM Date: (Unable to assess)  Intake/Output from previous day: 12/27 0701 - 12/28 0700 In: 120 [P.O.:120] Out: 100 [Urine:100]  PHYSICAL EXAM General appearance: She is awake but sluggish. Resp: rhonchi bilaterally Cardio: regular rate and rhythm, S1, S2 normal, no murmur, click, rub or gallop GI: soft, non-tender; bowel sounds normal; no masses,  no organomegaly Extremities: extremities normal, atraumatic, no cyanosis or edema  Lab Results:  Results for orders placed or performed during the hospital encounter of 05/23/18 (from the past 48 hour(s))  Glucose, capillary     Status: Abnormal   Collection Time: 05/30/18 11:32 AM  Result Value Ref Range   Glucose-Capillary 121 (H) 70 - 99 mg/dL  Glucose, capillary     Status: Abnormal   Collection Time: 05/30/18  4:31 PM  Result Value Ref Range   Glucose-Capillary 133 (H) 70 - 99 mg/dL  Brain natriuretic peptide     Status: None   Collection Time: 05/30/18  8:41 PM  Result Value Ref Range   B Natriuretic Peptide 31.0 0.0 - 100.0 pg/mL    Comment: Performed at [facility], [address], [place], [place] [address]  Glucose, capillary     Status: Abnormal   Collection Time: 05/30/18  9:32 PM  Result Value Ref Range   Glucose-Capillary 164 (H) 70 - 99 mg/dL   Comment 1 QC Due   Basic metabolic panel     Status: Abnormal   Collection Time: 05/31/18  4:53 AM  Result Value Ref Range   Sodium 145 135 - 145 mmol/L   Potassium 3.7 3.5 - 5.1 mmol/L   Chloride 115 (H) 98 - 111 mmol/L   CO2 22 22 - 32 mmol/L   Glucose, Bld 120 (H) 70 - 99 mg/dL   BUN 12 6 - 20 mg/dL   Creatinine,  Ser 0.40 (L) 0.44 - 1.00 mg/dL   Calcium 8.5 (L) 8.9 - 10.3 mg/dL   GFR calc non Af Amer >60 >60 mL/min   GFR calc Af Amer >60 >60 mL/min   Anion gap 8 5 - 15    Comment: Performed at [facility], [address] [place], [place] [address]  Glucose, capillary     Status: Abnormal   Collection Time: 05/31/18  9:06 AM  Result Value Ref Range   Glucose-Capillary 111 (H) 70 - 99 mg/dL  Glucose, capillary     Status: Abnormal   Collection Time: 05/31/18 12:45 PM  Result Value Ref Range   Glucose-Capillary 128 (H) 70 - 99 mg/dL   Comment 1 Notify RN    Comment 2 Document in Chart   Glucose, capillary     Status: Abnormal   Collection Time: 05/31/18  4:26 PM  Result Value Ref Range   Glucose-Capillary 132 (H) 70 - 99 mg/dL   Comment 1 Notify RN    Comment 2 Document in Chart   Glucose, capillary     Status: Abnormal   Collection Time: 05/31/18 10:33 PM  Result Value Ref Range   Glucose-Capillary 152 (H) 70 - 99 mg/dL   Comment 1 Notify RN    Comment 2  Document in Chart   Glucose, capillary     Status: Abnormal   Collection Time: 06/01/18  7:33 AM  Result Value Ref Range   Glucose-Capillary 102 (H) 70 - 99 mg/dL    ABGS No results for input(s): PHART, PO2ART, TCO2, HCO3 in the last 72 hours.  Invalid input(s): PCO2 CULTURES Recent Results (from the past 240 hour(s))  MRSA PCR Screening     Status: None   Collection Time: 05/23/18  9:55 PM  Result Value Ref Range Status   MRSA by PCR NEGATIVE NEGATIVE Final    Comment:        The GeneXpert MRSA Assay (FDA approved for NASAL specimens only), is one component of a comprehensive MRSA colonization surveillance program. It is not intended to diagnose MRSA infection nor to guide or monitor treatment for MRSA infections. Performed at [facility], [address], [place], [place] [address]   Culture, blood (routine x 2) Call MD if unable to obtain prior to antibiotics being given     Status: None   Collection Time: 05/23/18  10:12 PM  Result Value Ref Range Status   Specimen Description BLOOD LEFT HAND  Final   Special Requests   Final    BOTTLES DRAWN AEROBIC AND ANAEROBIC Blood Culture adequate volume   Culture   Final    NO GROWTH 5 DAYS Performed at [facility], [address], [place], [place] [address]    Report Status 05/28/2018 FINAL  Final  Culture, blood (routine x 2) Call MD if unable to obtain prior to antibiotics being given     Status: None   Collection Time: 05/23/18 10:18 PM  Result Value Ref Range Status   Specimen Description BLOOD LEFT HAND  Final   Special Requests   Final    BOTTLES DRAWN AEROBIC AND ANAEROBIC Blood Culture adequate volume   Culture   Final    NO GROWTH 5 DAYS Performed at [facility], [address], [place], [place] [address]    Report Status 05/28/2018 FINAL  Final  Culture, respiratory     Status: None   Collection Time: 05/25/18  1:09 AM  Result Value Ref Range Status   Specimen Description   Final    SPUTUM Performed at [facility], [address], [place], [place] [address]    Special Requests   Final    NONE Performed at [facility], [address], [place], [place] [address]    Gram Stain   Final    RARE WBC PRESENT,BOTH PMN AND MONONUCLEAR RARE GRAM POSITIVE RODS RARE BUDDING YEAST SEEN RARE SQUAMOUS EPITHELIAL CELLS PRESENT Performed at [facility], [place] [address] [place], [place] [address]    Culture FEW CANDIDA DUBLINIENSIS  Final   Report Status 05/27/2018 FINAL  Final   Studies/Results: Dg Chest Port 1 View  Result Date: 05/30/2018 CLINICAL DATA:  Fever with shortness of breath EXAM: PORTABLE CHEST 1 VIEW COMPARISON:  May 27, 2018 FINDINGS: There is cardiomegaly with pulmonary venous hypertension. There is widespread airspace consolidation in both mid and lower lung zones. There are pleural effusions bilaterally. There is a degree of interstitial pulmonary edema. IMPRESSION: Persistent pulmonary vascular congestion with  pleural effusions bilaterally. There is interstitial pulmonary edema. Airspace consolidation in both mid and lower lung zones may represent alveolar edema; pneumonia cannot be excluded in the lower lung zones, however. There may be both alveolar edema and pneumonia in the bases. The overall appearance does indicate a degree of congestive  heart failure. Electronically Signed   By: Lowella Grip III M.D.   On: 05/30/2018 19:47    Medications:  Prior to Admission:  Medications Prior to Admission  Medication Sig Dispense Refill Last Dose  . amLODipine (NORVASC) 2.5 MG tablet Take 7.5 mg by mouth daily.   05/23/2018 at 1000  . aspirin 325 MG EC tablet Take 81 mg by mouth daily.    05/23/2018 at 1000  . atorvastatin (LIPITOR) 40 MG tablet Take 40 mg by mouth daily.   Past Week at 2100  . Dimethyl Fumarate (TECFIDERA) 240 MG CPDR Take by mouth 2 (two) times daily.   05/23/2018 at 1000  . divalproex (DEPAKOTE SPRINKLE) 125 MG capsule Take 500 mg by mouth 2 (two) times daily.   05/23/2018 at 1000  . furosemide (LASIX) 20 MG tablet Take 20 mg by mouth daily.   05/23/2018 at 1700  . ipratropium-albuterol (DUONEB) 0.5-2.5 (3) MG/3ML SOLN Take 3 mLs by nebulization every 6 (six) hours as needed.   05/23/2018 at Unknown time  . levETIRAcetam (KEPPRA) 100 MG/ML solution Take 1,500 mg by mouth 2 (two) times daily.   05/23/2018 at 1000  . Linaclotide (LINZESS) 145 MCG CAPS capsule Take 1 capsule (145 mcg total) by mouth daily. 30 capsule 5 05/23/2018 at 1000  . loratadine (CLARITIN) 10 MG tablet Take 1 tablet (10 mg total) by mouth daily. 30 tablet 1 05/23/2018 at 1000  . LORazepam (ATIVAN) 2 MG tablet Take 0.25 mg by mouth at bedtime.    Past Week at 2100  . metoprolol tartrate (LOPRESSOR) 25 MG tablet Take 50 mg by mouth 2 (two) times daily.   05/23/2018 at 1000  . Multiple Vitamin (MULTIVITAMIN WITH MINERALS) TABS Take 1 tablet by mouth daily. 30 tablet 1 05/23/2018 at 1000  . oxybutynin (DITROPAN) 5 MG  tablet Take 5 mg by mouth 2 (two) times daily.   05/23/2018 at 1000  . thiamine 100 MG tablet Take 1 tablet (100 mg total) by mouth daily. 30 tablet 3 05/23/2018 at 1000  . topiramate (TOPAMAX) 25 MG tablet Take 50 mg by mouth 2 (two) times daily.   05/23/2018 at 1000  . venlafaxine XR (EFFEXOR-XR) 150 MG 24 hr capsule Take 1 capsule (150 mg total) by mouth daily. (Patient taking differently: Take 112.5 mg by mouth daily. ) 30 capsule 1 05/23/2018 at 1000  . cyclobenzaprine (FLEXERIL) 10 MG tablet Take 1 tablet (10 mg total) by mouth 2 (two) times daily. (Patient not taking: Reported on 05/24/2018) 30 tablet 1 Not Taking at Unknown time  . levETIRAcetam (KEPPRA) 500 MG tablet Take 2 tablets (1,000 mg total) by mouth 2 (two) times daily. (Patient not taking: Reported on 05/24/2018) 60 tablet 1 Not Taking at Unknown time  . montelukast (SINGULAIR) 10 MG tablet Take 1 tablet (10 mg total) by mouth at bedtime. (Patient not taking: Reported on 05/24/2018) 30 tablet 1 Not Taking at Unknown time  . peg 3350 powder (MOVIPREP) 100 G SOLR Take 1 kit (200 g total) by mouth as directed. (Patient not taking: Reported on 05/24/2018) 1 kit 0 Not Taking at Unknown time  . simvastatin (ZOCOR) 40 MG tablet Take 1 tablet (40 mg total) by mouth at bedtime. (Patient not taking: Reported on 05/24/2018) 30 tablet 1 Not Taking at Unknown time  . sodium phosphate (FLEET) 7-19 GM/118ML ENEM Place 133 mLs (1 enema total) rectally once. (Patient not taking: Reported on 08/23/2014) 1 enema 0 Not Taking at Unknown time   Scheduled: .  acetylcysteine  4 mL Nebulization BID  . amLODipine  7.5 mg Oral Daily  . atorvastatin  40 mg Oral q1800  . divalproex  500 mg Oral BID  . enoxaparin (LOVENOX) injection  40 mg Subcutaneous Q24H  . Ferrous Fumarate  1 tablet Oral BID  . insulin aspart  0-5 Units Subcutaneous QHS  . insulin aspart  0-9 Units Subcutaneous TID WC  . ipratropium-albuterol  3 mL Nebulization Q6H WA  . levETIRAcetam   1,500 mg Oral BID  . linaclotide  145 mcg Oral Daily  . LORazepam  0.25 mg Oral QHS  . mouth rinse  15 mL Mouth Rinse BID  . metoprolol tartrate  50 mg Oral BID  . montelukast  10 mg Oral QHS  . oxybutynin  5 mg Oral BID  . topiramate  50 mg Oral BID  . venlafaxine XR  112.5 mg Oral Daily   Continuous:  VOH:[id number], albuterol  Assesment: She was admitted with healthcare associated pneumonia and systemic inflammatory response syndrome.  She also has influenza A acute hypoxic respiratory failure.  I think she is slowly improving  Her situation is complicated by the fact that she has multiple sclerosis bipolar disease and seizure disorder and all of this makes communication difficult. Principal Problem:   HCAP (healthcare-associated pneumonia) Active Problems:   Hypokalemia   History of multiple sclerosis   Seizure disorder (HCC)   SIRS (systemic inflammatory response syndrome) (HCC)   Influenza A   Acute respiratory failure with hypoxia ([place])    Plan: Continue current treatments.  Chest x-ray probably on 12/29 12/30 to see if we see any difference in the pneumonia    LOS: 9 days   Alonza Bogus 104-30-2019, 10:14 AM

## 2018-06-01 NOTE — Progress Notes (Signed)
PROGRESS NOTE    Tami Nolan  ZOX:[id number] DOB: [birth year] DOA: 05/23/2018 PCP: [name], MD    Brief Narrative:  57 year old female with a history of bipolar disorder, hypertension, multiple sclerosis, who is a resident of a skilled nursing facility, admitted to the hospital with shortness of breath, hypoxia and fevers.  Found to have healthcare associated pneumonia and influenza A.  Currently on IV antibiotics and Tamiflu.  She also has significant hypoxia and is on supplemental oxygen.  Assessment & Plan:   Principal Problem:   HCAP (healthcare-associated pneumonia) Active Problems:   Hypokalemia   History of multiple sclerosis   Seizure disorder (HCC)   SIRS (systemic inflammatory response syndrome) (HCC)   Influenza A   Acute respiratory failure with hypoxia (HCC)   1. Acute respiratory failure with hypoxia.  Secondary to pneumonia and influenza.  Patient remains on high flow nasal cannula.  Currently she is requiring 7 L.  Continues to have bilateral crackles.  Chest x-ray repeated indicated persistent pneumonia and possibly some element of CHF.  BNP was normal.  Pulmonology input appreciated.  We will try and wean down oxygen as tolerated.  Plans are for repeat chest x-ray 2. Healthcare associated pneumonia.  Currently on cefepime.  MRSA PCR is negative, vancomycin discontinued.  Continue current treatments.  Continues to have bilateral rhonchi.  Continue pulmonary hygiene 3. Sepsis - resolved.  4. Influenza A.  Completed a course of Tamiflu.  She is not having fevers.  Continue supportive care.  5. Hypokalemia. replace 6. Seizure disorder.  Continue on Keppra 7. Bipolar disorder.  Continue on Depakote.   8. Hypertension.  Currently on amlodipine, lisinopril and metoprolol.  Blood pressures are currently running low.  Will hold further amlodipine 9. Hyperlipidemia.  Continue statin 10. Multiple sclerosis.  Resume home medications on discharge.  DVT prophylaxis:  lovenox Code Status: full code Family Communication: no family present Disposition Plan: discharge back to SNF when stable   Consultants:   Pulmonology  Procedures:     Antimicrobials:   Cefepime 12/19>  Vancomycin 12/19>12/20   Subjective: Patient does not voice any complaints.  She simply smiles when asked any questions.  Continues to have bilateral rhonchi and productive cough, but cough effort appears to be poor  Objective: Vitals:   05/31/18 2057 05/31/18 2237 06/01/18 0631 06/01/18 0839  BP:  104/71 94/71   Pulse:  96 73   Resp:  (!) 24    Temp:  98.3 F (36.8 C) 97.6 F (36.4 C)   TempSrc:  Oral Oral   SpO2: (!) 89% 91% 93% 90%  Weight:      Height:        Intake/Output Summary (Last 24 hours) at 06/01/2018 1212 Last data filed at 06/01/2018 0900 Gross per 24 hour  Intake 240 ml  Output 100 ml  Net 140 ml   Filed Weights   05/23/18 1945 05/23/18 2207  Weight: 97.1 kg 83.8 kg    Examination:  General exam: Alert, awake, no distress Respiratory system: Bilateral rhonchi. Respiratory effort normal. Cardiovascular system:RRR. No murmurs, rubs, gallops. Gastrointestinal system: Abdomen is nondistended, soft and nontender. No organomegaly or masses felt. Normal bowel sounds heard. Central nervous system: No focal neurological deficits. Extremities: No C/C/E, +pedal pulses Skin: No rashes, lesions or ulcers Psychiatry: Does not really communicate, pleasant, smiling.   Data Reviewed: I have personally reviewed following labs and imaging studies  CBC: Recent Labs  Lab 05/27/18 0555 05/28/18 0704 05/29/18 0654 05/30/18 8119  WBC 7.2 7.5 7.2 7.4  NEUTROABS 3.5 3.6 4.0 3.6  HGB 12.8 13.7 14.0 13.2  HCT 38.1 40.8 41.8 39.6  MCV 83.2 81.8 82.9 82.0  PLT 202 245 295 302   Basic Metabolic Panel: Recent Labs  Lab 05/26/18 0533 05/27/18 0555 05/28/18 0704 05/29/18 0654 05/30/18 0527 05/31/18 0453  NA 142 142 142 143 144 145  K 3.3* 3.4* 3.6  3.3* 3.3* 3.7  CL 112* 113* 113* 112* 118* 115*  CO2 23 23 23 24  21* 22  GLUCOSE 121* 114* 104* 124* 119* 120*  BUN 6 11 8 10 14 12   CREATININE 0.36* 0.32* 0.40* 0.54 0.39* 0.40*  CALCIUM 8.3* 8.2* 8.4* 8.5* 8.6* 8.5*  MG 1.9  --   --   --   --   --    GFR: Estimated Creatinine Clearance: 85.3 mL/min (A) (by C-G formula based on SCr of 0.4 mg/dL (L)). Liver Function Tests: Recent Labs  Lab 05/27/18 0555 05/28/18 0704 05/29/18 0654 05/30/18 0527  AST 24 24 26 21   ALT 16 18 20 19   ALKPHOS 39 44 48 45  BILITOT 0.7 0.7 0.7 0.5  PROT 6.0* 6.9 7.0 6.8  ALBUMIN 2.3* 2.6* 2.7* 2.6*   No results for input(s): LIPASE, AMYLASE in the last 168 hours. No results for input(s): AMMONIA in the last 168 hours. Coagulation Profile: No results for input(s): INR, PROTIME in the last 168 hours. Cardiac Enzymes: No results for input(s): CKTOTAL, CKMB, CKMBINDEX, TROPONINI in the last 168 hours. BNP (last 3 results) No results for input(s): PROBNP in the last 8760 hours. HbA1C: No results for input(s): HGBA1C in the last 72 hours. CBG: Recent Labs  Lab 05/31/18 1245 05/31/18 1626 05/31/18 2233 06/01/18 0733 06/01/18 1124  GLUCAP 128* 132* 152* 102* 144*   Lipid Profile: No results for input(s): CHOL, HDL, LDLCALC, TRIG, CHOLHDL, LDLDIRECT in the last 72 hours. Thyroid Function Tests: No results for input(s): TSH, T4TOTAL, FREET4, T3FREE, THYROIDAB in the last 72 hours. Anemia Panel: No results for input(s): VITAMINB12, FOLATE, FERRITIN, TIBC, IRON, RETICCTPCT in the last 72 hours. Sepsis Labs: No results for input(s): PROCALCITON, LATICACIDVEN in the last 168 hours.  Recent Results (from the past 240 hour(s))  MRSA PCR Screening     Status: None   Collection Time: 05/23/18  9:55 PM  Result Value Ref Range Status   MRSA by PCR NEGATIVE NEGATIVE Final    Comment:        The GeneXpert MRSA Assay (FDA approved for NASAL specimens only), is one component of a comprehensive MRSA  colonization surveillance program. It is not intended to diagnose MRSA infection nor to guide or monitor treatment for MRSA infections. Performed at [facility], [address] [place], [place] [address]   Culture, blood (routine x 2) Call MD if unable to obtain prior to antibiotics being given     Status: None   Collection Time: 05/23/18 10:12 PM  Result Value Ref Range Status   Specimen Description BLOOD LEFT HAND  Final   Special Requests   Final    BOTTLES DRAWN AEROBIC AND ANAEROBIC Blood Culture adequate volume   Culture   Final    NO GROWTH 5 DAYS Performed at [facility], [address] [place], [place] [address]    Report Status 05/28/2018 FINAL  Final  Culture, blood (routine x 2) Call MD if unable to obtain prior to antibiotics being given     Status: None   Collection Time: 05/23/18 10:18  PM  Result Value Ref Range Status   Specimen Description BLOOD LEFT HAND  Final   Special Requests   Final    BOTTLES DRAWN AEROBIC AND ANAEROBIC Blood Culture adequate volume   Culture   Final    NO GROWTH 5 DAYS Performed at [facility], [address] [place], [place] [address]    Report Status 05/28/2018 FINAL  Final  Culture, respiratory     Status: None   Collection Time: 05/25/18  1:09 AM  Result Value Ref Range Status   Specimen Description   Final    SPUTUM Performed at [facility], [address] [place], [place] [address]    Special Requests   Final    NONE Performed at [facility], [address] [place], [place] [address]    Gram Stain   Final    RARE WBC PRESENT,BOTH PMN AND MONONUCLEAR RARE GRAM POSITIVE RODS RARE BUDDING YEAST SEEN RARE SQUAMOUS EPITHELIAL CELLS PRESENT Performed at [facility], [address] [place], [place] [address]    Culture FEW CANDIDA DUBLINIENSIS  Final   Report Status 05/27/2018 FINAL  Final     Radiology Studies: Dg Chest Port 1 View  Result Date: 05/30/2018 CLINICAL DATA:  Fever with shortness of breath  EXAM: PORTABLE CHEST 1 VIEW COMPARISON:  May 27, 2018 FINDINGS: There is cardiomegaly with pulmonary venous hypertension. There is widespread airspace consolidation in both mid and lower lung zones. There are pleural effusions bilaterally. There is a degree of interstitial pulmonary edema. IMPRESSION: Persistent pulmonary vascular congestion with pleural effusions bilaterally. There is interstitial pulmonary edema. Airspace consolidation in both mid and lower lung zones may represent alveolar edema; pneumonia cannot be excluded in the lower lung zones, however. There may be both alveolar edema and pneumonia in the bases. The overall appearance does indicate a degree of congestive heart failure. Electronically Signed   By: Bretta BangWilliam  Woodruff III M.D.   On: 05/30/2018 19:47   Scheduled Meds: . acetylcysteine  4 mL Nebulization BID  . amLODipine  7.5 mg Oral Daily  . atorvastatin  40 mg Oral q1800  . divalproex  500 mg Oral BID  . enoxaparin (LOVENOX) injection  40 mg Subcutaneous Q24H  . Ferrous Fumarate  1 tablet Oral BID  . insulin aspart  0-5 Units Subcutaneous QHS  . insulin aspart  0-9 Units Subcutaneous TID WC  . ipratropium-albuterol  3 mL Nebulization Q6H WA  . levETIRAcetam  1,500 mg Oral BID  . linaclotide  145 mcg Oral Daily  . LORazepam  0.25 mg Oral QHS  . mouth rinse  15 mL Mouth Rinse BID  . metoprolol tartrate  50 mg Oral BID  . montelukast  10 mg Oral QHS  . oxybutynin  5 mg Oral BID  . topiramate  50 mg Oral BID  . venlafaxine XR  112.5 mg Oral Daily   Continuous Infusions:    LOS: 9 days  \ Erick BlinksJehanzeb , MD [facility] Pager [phone]  If 7PM-7AM, please contact night-coverage [email or web address] Password [email or web address] 1June 06, 202019, 12:12 PM

## 2018-06-01 NOTE — Progress Notes (Signed)
Patient does not cough on command as she does not understand. She has lots of upper airway nose which is transmitted over decreased breath sounds,  Rhonchus. Oxygen decreased to 4 lpm saturation 93

## 2018-06-02 LAB — GLUCOSE, CAPILLARY
Glucose-Capillary: 118 mg/dL — ABNORMAL HIGH (ref 70–99)
Glucose-Capillary: 140 mg/dL — ABNORMAL HIGH (ref 70–99)
Glucose-Capillary: 112 mg/dL — ABNORMAL HIGH (ref 70–99)
Glucose-Capillary: 134 mg/dL — ABNORMAL HIGH (ref 70–99)

## 2018-06-02 LAB — CBC
HCT: 42.7 % (ref 36.0–46.0)
Hemoglobin: 14 g/dL (ref 12.0–15.0)
MCH: 28.1 pg (ref 26.0–34.0)
MCHC: 32.8 g/dL (ref 30.0–36.0)
MCV: 85.7 fL (ref 80.0–100.0)
Platelets: 330 K/uL (ref 150–400)
RBC: 4.98 MIL/uL (ref 3.87–5.11)
RDW: 15 % (ref 11.5–15.5)
WBC: 9.3 K/uL (ref 4.0–10.5)
nRBC: 0 % (ref 0.0–0.2)

## 2018-06-02 LAB — BASIC METABOLIC PANEL WITH GFR
Anion gap: 5 (ref 5–15)
BUN: 15 mg/dL (ref 6–20)
CO2: 24 mmol/L (ref 22–32)
Calcium: 8.9 mg/dL (ref 8.9–10.3)
Chloride: 118 mmol/L — ABNORMAL HIGH (ref 98–111)
Creatinine, Ser: 0.46 mg/dL (ref 0.44–1.00)
GFR calc Af Amer: >60 mL/min
GFR calc non Af Amer: >60 mL/min
Glucose, Bld: 111 mg/dL — ABNORMAL HIGH (ref 70–99)
Potassium: 4 mmol/L (ref 3.5–5.1)
Sodium: 147 mmol/L — ABNORMAL HIGH (ref 135–145)

## 2018-06-02 NOTE — Progress Notes (Signed)
Subjective: She seems to be about the same.  She gives me one-word answers but mostly says leave me alone.  She still has some cough and congestion.  Her oxygen has been able to be turned down  Objective: Vital signs in last 24 hours: Temp:  [97.5 F (36.4 C)-97.6 F (36.4 C)] 97.6 F (36.4 C) (12/29 0444) Pulse Rate:  [87-93] 87 (12/29 0444) Resp:  [16-18] 16 (12/29 0444) BP: (113-122)/(81) 122/81 (12/29 0444) SpO2:  [89 %-94 %] 90 % (12/29 1013) Weight change:  Last BM Date: (Unable to assess)  Intake/Output from previous day: 12/28 0701 - 12/29 0700 In: 720 [P.O.:720] Out: 600 [Urine:600]  PHYSICAL EXAM General appearance: alert and cooperative Resp: rhonchi bilaterally Cardio: regular rate and rhythm, S1, S2 normal, no murmur, click, rub or gallop GI: soft, non-tender; bowel sounds normal; no masses,  no organomegaly Extremities: extremities normal, atraumatic, no cyanosis or edema The statement that she is alert and cooperative is incorrect.  She is alert but not really able to cooperate Lab Results:  Results for orders placed or performed during the hospital encounter of 05/23/18 (from the past 48 hour(s))  Glucose, capillary     Status: Abnormal   Collection Time: 05/31/18 12:45 PM  Result Value Ref Range   Glucose-Capillary 128 (H) 70 - 99 mg/dL   Comment 1 Notify RN    Comment 2 Document in Chart   Glucose, capillary     Status: Abnormal   Collection Time: 05/31/18  4:26 PM  Result Value Ref Range   Glucose-Capillary 132 (H) 70 - 99 mg/dL   Comment 1 Notify RN    Comment 2 Document in Chart   Glucose, capillary     Status: Abnormal   Collection Time: 05/31/18 10:33 PM  Result Value Ref Range   Glucose-Capillary 152 (H) 70 - 99 mg/dL   Comment 1 Notify RN    Comment 2 Document in Chart   Glucose, capillary     Status: Abnormal   Collection Time: 06/01/18  7:33 AM  Result Value Ref Range   Glucose-Capillary 102 (H) 70 - 99 mg/dL  Glucose, capillary      Status: Abnormal   Collection Time: 06/01/18 11:24 AM  Result Value Ref Range   Glucose-Capillary 144 (H) 70 - 99 mg/dL  Glucose, capillary     Status: Abnormal   Collection Time: 06/01/18  4:20 PM  Result Value Ref Range   Glucose-Capillary 155 (H) 70 - 99 mg/dL  Glucose, capillary     Status: Abnormal   Collection Time: 06/01/18 10:15 PM  Result Value Ref Range   Glucose-Capillary 136 (H) 70 - 99 mg/dL  Basic metabolic panel     Status: Abnormal   Collection Time: 06/02/18  5:23 AM  Result Value Ref Range   Sodium 147 (H) 135 - 145 mmol/L   Potassium 4.0 3.5 - 5.1 mmol/L   Chloride 118 (H) 98 - 111 mmol/L   CO2 24 22 - 32 mmol/L   Glucose, Bld 111 (H) 70 - 99 mg/dL   BUN 15 6 - 20 mg/dL   Creatinine, Ser 0.46 0.44 - 1.00 mg/dL   Calcium 8.9 8.9 - 10.3 mg/dL   GFR calc non Af Amer >60 >60 mL/min   GFR calc Af Amer >60 >60 mL/min   Anion gap 5 5 - 15    Comment: Performed at [facility], [address], [place], [place] [address]  CBC     Status: None  Collection Time: 06/02/18  5:23 AM  Result Value Ref Range   WBC 9.3 4.0 - 10.5 K/uL   RBC 4.98 3.87 - 5.11 MIL/uL   Hemoglobin 14.0 12.0 - 15.0 g/dL   HCT 42.7 36.0 - 46.0 %   MCV 85.7 80.0 - 100.0 fL   MCH 28.1 26.0 - 34.0 pg   MCHC 32.8 30.0 - 36.0 g/dL   RDW 15.0 11.5 - 15.5 %   Platelets 330 150 - 400 K/uL   nRBC 0.0 0.0 - 0.2 %    Comment: Performed at [facility], [address] [place], [place] [address]  Glucose, capillary     Status: Abnormal   Collection Time: 06/02/18  7:30 AM  Result Value Ref Range   Glucose-Capillary 118 (H) 70 - 99 mg/dL  Glucose, capillary     Status: Abnormal   Collection Time: 06/02/18 11:34 AM  Result Value Ref Range   Glucose-Capillary 140 (H) 70 - 99 mg/dL    ABGS No results for input(s): PHART, PO2ART, TCO2, HCO3 in the last 72 hours.  Invalid input(s): PCO2 CULTURES Recent Results (from the past 240 hour(s))  MRSA PCR Screening     Status: None   Collection Time:  05/23/18  9:55 PM  Result Value Ref Range Status   MRSA by PCR NEGATIVE NEGATIVE Final    Comment:        The GeneXpert MRSA Assay (FDA approved for NASAL specimens only), is one component of a comprehensive MRSA colonization surveillance program. It is not intended to diagnose MRSA infection nor to guide or monitor treatment for MRSA infections. Performed at [facility], [address], [place], [place] [address]   Culture, blood (routine x 2) Call MD if unable to obtain prior to antibiotics being given     Status: None   Collection Time: 05/23/18 10:12 PM  Result Value Ref Range Status   Specimen Description BLOOD LEFT HAND  Final   Special Requests   Final    BOTTLES DRAWN AEROBIC AND ANAEROBIC Blood Culture adequate volume   Culture   Final    NO GROWTH 5 DAYS Performed at [facility], [address] [place], [place] [address]    Report Status 05/28/2018 FINAL  Final  Culture, blood (routine x 2) Call MD if unable to obtain prior to antibiotics being given     Status: None   Collection Time: 05/23/18 10:18 PM  Result Value Ref Range Status   Specimen Description BLOOD LEFT HAND  Final   Special Requests   Final    BOTTLES DRAWN AEROBIC AND ANAEROBIC Blood Culture adequate volume   Culture   Final    NO GROWTH 5 DAYS Performed at [facility], [address], [place], [place] [address]    Report Status 05/28/2018 FINAL  Final  Culture, respiratory     Status: None   Collection Time: 05/25/18  1:09 AM  Result Value Ref Range Status   Specimen Description   Final    SPUTUM Performed at [facility], [address] [place], [place] [address]    Special Requests   Final    NONE Performed at [facility], [address] [place], [place] [address]    Gram Stain   Final    RARE WBC PRESENT,BOTH PMN AND MONONUCLEAR RARE GRAM POSITIVE RODS RARE BUDDING YEAST SEEN RARE SQUAMOUS EPITHELIAL CELLS PRESENT Performed at [facility], [place] [address], [place],   [address]    Culture FEW  CANDIDA DUBLINIENSIS  Final   Report Status 05/27/2018 FINAL  Final   Studies/Results: No results found.  Medications:  Prior to Admission:  Medications Prior to Admission  Medication Sig Dispense Refill Last Dose  . amLODipine (NORVASC) 2.5 MG tablet Take 7.5 mg by mouth daily.   05/23/2018 at 1000  . aspirin 325 MG EC tablet Take 81 mg by mouth daily.    05/23/2018 at 1000  . atorvastatin (LIPITOR) 40 MG tablet Take 40 mg by mouth daily.   Past Week at 2100  . Dimethyl Fumarate (TECFIDERA) 240 MG CPDR Take by mouth 2 (two) times daily.   05/23/2018 at 1000  . divalproex (DEPAKOTE SPRINKLE) 125 MG capsule Take 500 mg by mouth 2 (two) times daily.   05/23/2018 at 1000  . furosemide (LASIX) 20 MG tablet Take 20 mg by mouth daily.   05/23/2018 at 1700  . ipratropium-albuterol (DUONEB) 0.5-2.5 (3) MG/3ML SOLN Take 3 mLs by nebulization every 6 (six) hours as needed.   05/23/2018 at Unknown time  . levETIRAcetam (KEPPRA) 100 MG/ML solution Take 1,500 mg by mouth 2 (two) times daily.   05/23/2018 at 1000  . Linaclotide (LINZESS) 145 MCG CAPS capsule Take 1 capsule (145 mcg total) by mouth daily. 30 capsule 5 05/23/2018 at 1000  . loratadine (CLARITIN) 10 MG tablet Take 1 tablet (10 mg total) by mouth daily. 30 tablet 1 05/23/2018 at 1000  . LORazepam (ATIVAN) 2 MG tablet Take 0.25 mg by mouth at bedtime.    Past Week at 2100  . metoprolol tartrate (LOPRESSOR) 25 MG tablet Take 50 mg by mouth 2 (two) times daily.   05/23/2018 at 1000  . Multiple Vitamin (MULTIVITAMIN WITH MINERALS) TABS Take 1 tablet by mouth daily. 30 tablet 1 05/23/2018 at 1000  . oxybutynin (DITROPAN) 5 MG tablet Take 5 mg by mouth 2 (two) times daily.   05/23/2018 at 1000  . thiamine 100 MG tablet Take 1 tablet (100 mg total) by mouth daily. 30 tablet 3 05/23/2018 at 1000  . topiramate (TOPAMAX) 25 MG tablet Take 50 mg by mouth 2 (two) times daily.   05/23/2018 at 1000  . venlafaxine XR (EFFEXOR-XR) 150  MG 24 hr capsule Take 1 capsule (150 mg total) by mouth daily. (Patient taking differently: Take 112.5 mg by mouth daily. ) 30 capsule 1 05/23/2018 at 1000  . cyclobenzaprine (FLEXERIL) 10 MG tablet Take 1 tablet (10 mg total) by mouth 2 (two) times daily. (Patient not taking: Reported on 05/24/2018) 30 tablet 1 Not Taking at Unknown time  . levETIRAcetam (KEPPRA) 500 MG tablet Take 2 tablets (1,000 mg total) by mouth 2 (two) times daily. (Patient not taking: Reported on 05/24/2018) 60 tablet 1 Not Taking at Unknown time  . montelukast (SINGULAIR) 10 MG tablet Take 1 tablet (10 mg total) by mouth at bedtime. (Patient not taking: Reported on 05/24/2018) 30 tablet 1 Not Taking at Unknown time  . peg 3350 powder (MOVIPREP) 100 G SOLR Take 1 kit (200 g total) by mouth as directed. (Patient not taking: Reported on 05/24/2018) 1 kit 0 Not Taking at Unknown time  . simvastatin (ZOCOR) 40 MG tablet Take 1 tablet (40 mg total) by mouth at bedtime. (Patient not taking: Reported on 05/24/2018) 30 tablet 1 Not Taking at Unknown time  . sodium phosphate (FLEET) 7-19 GM/118ML ENEM Place 133 mLs (1 enema total) rectally once. (Patient not taking: Reported on 08/23/2014) 1 enema 0 Not Taking at Unknown time   Scheduled: .  acetylcysteine  4 mL Nebulization BID  . atorvastatin  40 mg Oral q1800  . divalproex  500 mg Oral BID  . enoxaparin (LOVENOX) injection  40 mg Subcutaneous Q24H  . Ferrous Fumarate  1 tablet Oral BID  . insulin aspart  0-5 Units Subcutaneous QHS  . insulin aspart  0-9 Units Subcutaneous TID WC  . ipratropium-albuterol  3 mL Nebulization Q6H WA  . levETIRAcetam  1,500 mg Oral BID  . linaclotide  145 mcg Oral Daily  . LORazepam  0.25 mg Oral QHS  . mouth rinse  15 mL Mouth Rinse BID  . metoprolol tartrate  50 mg Oral BID  . montelukast  10 mg Oral QHS  . oxybutynin  5 mg Oral BID  . topiramate  50 mg Oral BID  . venlafaxine XR  112.5 mg Oral Daily   Continuous:  ZPS:[id number],  albuterol  Assesment: She has healthcare associated pneumonia.  She is difficult to assess because of her other medical problems.  She does seem to be getting somewhat better.  Her oxygen has been decreased.  She has influenza A and has finished treatment  She has acute hypoxic respiratory failure still on 4 L. Principal Problem:   HCAP (healthcare-associated pneumonia) Active Problems:   Hypokalemia   History of multiple sclerosis   Seizure disorder (HCC)   SIRS (systemic inflammatory response syndrome) (HCC)   Influenza A   Acute respiratory failure with hypoxia (HCC)    Plan: Continue treatments.  Check chest x-ray in the morning.    LOS: 10 days   Alonza Bogus 06/02/2018, 12:11 PM

## 2018-06-02 NOTE — Progress Notes (Signed)
PROGRESS NOTE    MAKINZEE ARBUTHNOT  QIH:[id number] DOB: Sep 27, 1960 DOA: 05/23/2018 PCP: [name], MD    Brief Narrative:  57 year old female with a history of bipolar disorder, hypertension, multiple sclerosis, who is a resident of a skilled nursing facility, admitted to the hospital with shortness of breath, hypoxia and fevers.  Found to have healthcare associated pneumonia and influenza A.  Currently on IV antibiotics and Tamiflu.  She also has significant hypoxia and is on supplemental oxygen.  Assessment & Plan:   Principal Problem:   HCAP (healthcare-associated pneumonia) Active Problems:   Hypokalemia   History of multiple sclerosis   Seizure disorder (HCC)   SIRS (systemic inflammatory response syndrome) (HCC)   Influenza A   Acute respiratory failure with hypoxia (HCC)   1. Acute respiratory failure with hypoxia.  Secondary to pneumonia and influenza.  Patient has been requiring oxygen via high flow nasal cannula.  This morning she was on 4 L, but appears to have taken it off.  She does not have any signs of distress.  We will continue to monitor off of oxygen.  Chest x-ray repeated indicated persistent pneumonia and possibly some element of CHF.  BNP was normal.  Pulmonology input appreciated.  We will try and wean down oxygen as tolerated.  Plans are for repeat chest x-ray 2. Healthcare associated pneumonia.  Completed course of cefepime.  MRSA PCR is negative, vancomycin discontinued.  Continue current treatments.  Continue pulmonary hygiene 3. Sepsis - resolved.  4. Influenza A.  Completed a course of Tamiflu.  She is not having fevers.  Continue supportive care.  5. Hypokalemia. replace 6. Seizure disorder.  Continue on Keppra 7. Bipolar disorder.  Continue on Depakote.   8. Hypertension.  Currently on amlodipine, lisinopril and metoprolol.  Blood pressures are currently running low.  Will hold further amlodipine 9. Hyperlipidemia.  Continue statin 10. Multiple  sclerosis.  Resume home medications on discharge.  DVT prophylaxis: lovenox Code Status: full code Family Communication: no family present Disposition Plan: discharge back to SNF, possibly in a.m. if respiratory status appears stable   Consultants:   Pulmonology  Procedures:     Antimicrobials:   Cefepime 12/19>12/28  Vancomycin 12/19>12/20   Subjective: She is currently not on any oxygen and appears to be in the low 90s.  We will continue to hold off on oxygen supplementation.  She does not appear to be in any distress.  She does not really participate in history.  Objective: Vitals:   06/02/18 0444 06/02/18 1013 06/02/18 1358 06/02/18 1359  BP: 122/81  (!) 140/99 (!) 140/99  Pulse: 87  93 90  Resp: 16  17 17   Temp: 97.6 F (36.4 C)  98.9 F (37.2 C) 98.9 F (37.2 C)  TempSrc: Oral  Oral Oral  SpO2: 90% 90% 92% 92%  Weight:      Height:        Intake/Output Summary (Last 24 hours) at 06/02/2018 1744 Last data filed at 06/02/2018 1700 Gross per 24 hour  Intake 360 ml  Output -  Net 360 ml   Filed Weights   05/23/18 1945 05/23/18 2207  Weight: 97.1 kg 83.8 kg    Examination:  General exam: Alert, awake, no distress Respiratory system: Bilateral rhonchi improved. Respiratory effort normal. Cardiovascular system:RRR. No murmurs, rubs, gallops. Gastrointestinal system: Abdomen is nondistended, soft and nontender. No organomegaly or masses felt. Normal bowel sounds heard. Central nervous system: . No focal neurological deficits. Extremities: No C/C/E, +pedal pulses  Skin: No rashes, lesions or ulcers Psychiatry: Answers with one-word answers.  Calm, pleasant   Data Reviewed: I have personally reviewed following labs and imaging studies  CBC: Recent Labs  Lab 05/27/18 0555 05/28/18 0704 05/29/18 0654 05/30/18 0527 06/02/18 0523  WBC 7.2 7.5 7.2 7.4 9.3  NEUTROABS 3.5 3.6 4.0 3.6  --   HGB 12.8 13.7 14.0 13.2 14.0  HCT 38.1 40.8 41.8 39.6 42.7    MCV 83.2 81.8 82.9 82.0 85.7  PLT 202 245 295 302 330   Basic Metabolic Panel: Recent Labs  Lab 05/28/18 0704 05/29/18 0654 05/30/18 0527 05/31/18 0453 06/02/18 0523  NA 142 143 144 145 147*  K 3.6 3.3* 3.3* 3.7 4.0  CL 113* 112* 118* 115* 118*  CO2 23 24 21* 22 24  GLUCOSE 104* 124* 119* 120* 111*  BUN 8 10 14 12 15   CREATININE 0.40* 0.54 0.39* 0.40* 0.46  CALCIUM 8.4* 8.5* 8.6* 8.5* 8.9   GFR: Estimated Creatinine Clearance: 85.3 mL/min (by C-G formula based on SCr of 0.46 mg/dL). Liver Function Tests: Recent Labs  Lab 05/27/18 0555 05/28/18 0704 05/29/18 0654 05/30/18 0527  AST 24 24 26 21   ALT 16 18 20 19   ALKPHOS 39 44 48 45  BILITOT 0.7 0.7 0.7 0.5  PROT 6.0* 6.9 7.0 6.8  ALBUMIN 2.3* 2.6* 2.7* 2.6*   No results for input(s): LIPASE, AMYLASE in the last 168 hours. No results for input(s): AMMONIA in the last 168 hours. Coagulation Profile: No results for input(s): INR, PROTIME in the last 168 hours. Cardiac Enzymes: No results for input(s): CKTOTAL, CKMB, CKMBINDEX, TROPONINI in the last 168 hours. BNP (last 3 results) No results for input(s): PROBNP in the last 8760 hours. HbA1C: No results for input(s): HGBA1C in the last 72 hours. CBG: Recent Labs  Lab 06/01/18 1620 06/01/18 2215 06/02/18 0730 06/02/18 1134 06/02/18 1710  GLUCAP 155* 136* 118* 140* 112*   Lipid Profile: No results for input(s): CHOL, HDL, LDLCALC, TRIG, CHOLHDL, LDLDIRECT in the last 72 hours. Thyroid Function Tests: No results for input(s): TSH, T4TOTAL, FREET4, T3FREE, THYROIDAB in the last 72 hours. Anemia Panel: No results for input(s): VITAMINB12, FOLATE, FERRITIN, TIBC, IRON, RETICCTPCT in the last 72 hours. Sepsis Labs: No results for input(s): PROCALCITON, LATICACIDVEN in the last 168 hours.  Recent Results (from the past 240 hour(s))  MRSA PCR Screening     Status: None   Collection Time: 05/23/18  9:55 PM  Result Value Ref Range Status   MRSA by PCR NEGATIVE  NEGATIVE Final    Comment:        The GeneXpert MRSA Assay (FDA approved for NASAL specimens only), is one component of a comprehensive MRSA colonization surveillance program. It is not intended to diagnose MRSA infection nor to guide or monitor treatment for MRSA infections. Performed at [facility], [address] [place], [place] [address]   Culture, blood (routine x 2) Call MD if unable to obtain prior to antibiotics being given     Status: None   Collection Time: 05/23/18 10:12 PM  Result Value Ref Range Status   Specimen Description BLOOD LEFT HAND  Final   Special Requests   Final    BOTTLES DRAWN AEROBIC AND ANAEROBIC Blood Culture adequate volume   Culture   Final    NO GROWTH 5 DAYS Performed at [facility], [address] [place], [place] [address]    Report Status 05/28/2018 FINAL  Final  Culture, blood (routine x 2)  Call MD if unable to obtain prior to antibiotics being given     Status: None   Collection Time: 05/23/18 10:18 PM  Result Value Ref Range Status   Specimen Description BLOOD LEFT HAND  Final   Special Requests   Final    BOTTLES DRAWN AEROBIC AND ANAEROBIC Blood Culture adequate volume   Culture   Final    NO GROWTH 5 DAYS Performed at [facility], [address], [place], [place] [address]    Report Status 05/28/2018 FINAL  Final  Culture, respiratory     Status: None   Collection Time: 05/25/18  1:09 AM  Result Value Ref Range Status   Specimen Description   Final    SPUTUM Performed at [facility], [address] [place], [place] [address]    Special Requests   Final    NONE Performed at [facility], [address], [place], [place] [address]    Gram Stain   Final    RARE WBC PRESENT,BOTH PMN AND MONONUCLEAR RARE GRAM POSITIVE RODS RARE BUDDING YEAST SEEN RARE SQUAMOUS EPITHELIAL CELLS PRESENT Performed at [facility], [address], [place], [place] [address]    Culture FEW CANDIDA DUBLINIENSIS  Final   Report Status  05/27/2018 FINAL  Final     Radiology Studies: No results found. Scheduled Meds: . acetylcysteine  4 mL Nebulization BID  . atorvastatin  40 mg Oral q1800  . divalproex  500 mg Oral BID  . enoxaparin (LOVENOX) injection  40 mg Subcutaneous Q24H  . Ferrous Fumarate  1 tablet Oral BID  . insulin aspart  0-5 Units Subcutaneous QHS  . insulin aspart  0-9 Units Subcutaneous TID WC  . ipratropium-albuterol  3 mL Nebulization Q6H WA  . levETIRAcetam  1,500 mg Oral BID  . linaclotide  145 mcg Oral Daily  . LORazepam  0.25 mg Oral QHS  . mouth rinse  15 mL Mouth Rinse BID  . metoprolol tartrate  50 mg Oral BID  . montelukast  10 mg Oral QHS  . oxybutynin  5 mg Oral BID  . topiramate  50 mg Oral BID  . venlafaxine XR  112.5 mg Oral Daily   Continuous Infusions:    LOS: 10 days  \ Erick Blinks, MD [facility] Pager [phone]  If 7PM-7AM, please contact night-coverage [email or web address] Password [facility] 06/02/2018, 5:44 PM

## 2018-06-03 ENCOUNTER — Inpatient Hospital Stay (HOSPITAL_COMMUNITY): Payer: Medicare Other

## 2018-06-03 ENCOUNTER — Encounter (HOSPITAL_COMMUNITY): Payer: Self-pay | Admitting: Primary Care

## 2018-06-03 DIAGNOSIS — Z515 Encounter for palliative care: Secondary | ICD-10-CM

## 2018-06-03 DIAGNOSIS — Z7189 Other specified counseling: Secondary | ICD-10-CM

## 2018-06-03 LAB — BASIC METABOLIC PANEL
Anion gap: 8 (ref 5–15)
BUN: 12 mg/dL (ref 6–20)
CO2: 20 mmol/L — ABNORMAL LOW (ref 22–32)
Calcium: 8.9 mg/dL (ref 8.9–10.3)
Chloride: 117 mmol/L — ABNORMAL HIGH (ref 98–111)
Creatinine, Ser: 0.39 mg/dL — ABNORMAL LOW (ref 0.44–1.00)
GFR calc non Af Amer: >60 mL/min (ref >60)
Glucose, Bld: 126 mg/dL — ABNORMAL HIGH (ref 70–99)
Potassium: 3.2 mmol/L — ABNORMAL LOW (ref 3.5–5.1)
SODIUM: 145 mmol/L (ref 135–145)

## 2018-06-03 LAB — GLUCOSE, CAPILLARY
GLUCOSE-CAPILLARY: 137 mg/dL — AB (ref 70–99)
Glucose-Capillary: 143 mg/dL — ABNORMAL HIGH (ref 70–99)
Glucose-Capillary: 126 mg/dL — ABNORMAL HIGH (ref 70–99)
Glucose-Capillary: 137 mg/dL — ABNORMAL HIGH (ref 70–99)

## 2018-06-03 NOTE — Progress Notes (Signed)
Subjective: She was admitted with healthcare associated pneumonia.  She still has significant chest congestion.  Objective: Vital signs in last 24 hours: Temp:  [97.7 F (36.5 C)-99.2 F (37.3 C)] 97.7 F (36.5 C) (12/30 0655) Pulse Rate:  [80-104] 80 (12/30 0655) Resp:  [16-20] 16 (12/30 0655) BP: (116-140)/(86-99) 116/86 (12/30 0655) SpO2:  [87 %-92 %] 91 % (12/30 0723) Weight change:  Last BM Date: (Unable to assess)  Intake/Output from previous day: 12/29 0701 - 12/30 0700 In: 240 [P.O.:240] Out: 1 [Stool:1]  PHYSICAL EXAM General appearance: She will arouse but is generally poorly responsive and answers with one-word Resp: rhonchi bilaterally Cardio: regular rate and rhythm, S1, S2 normal, no murmur, click, rub or gallop GI: soft, non-tender; bowel sounds normal; no masses,  no organomegaly Extremities: extremities normal, atraumatic, no cyanosis or edema  Lab Results:  Results for orders placed or performed during the hospital encounter of 05/23/18 (from the past 48 hour(s))  Glucose, capillary     Status: Abnormal   Collection Time: 06/01/18 11:24 AM  Result Value Ref Range   Glucose-Capillary 144 (H) 70 - 99 mg/dL  Glucose, capillary     Status: Abnormal   Collection Time: 06/01/18  4:20 PM  Result Value Ref Range   Glucose-Capillary 155 (H) 70 - 99 mg/dL  Glucose, capillary     Status: Abnormal   Collection Time: 06/01/18 10:15 PM  Result Value Ref Range   Glucose-Capillary 136 (H) 70 - 99 mg/dL  Basic metabolic panel     Status: Abnormal   Collection Time: 06/02/18  5:23 AM  Result Value Ref Range   Sodium 147 (H) 135 - 145 mmol/L   Potassium 4.0 3.5 - 5.1 mmol/L   Chloride 118 (H) 98 - 111 mmol/L   CO2 24 22 - 32 mmol/L   Glucose, Bld 111 (H) 70 - 99 mg/dL   BUN 15 6 - 20 mg/dL   Creatinine, Ser 0.46 0.44 - 1.00 mg/dL   Calcium 8.9 8.9 - 10.3 mg/dL   GFR calc non Af Amer >60 >60 mL/min   GFR calc Af Amer >60 >60 mL/min   Anion gap 5 5 - 15   Comment: Performed at [facility], [address], [place], [place] [address]  CBC     Status: None   Collection Time: 06/02/18  5:23 AM  Result Value Ref Range   WBC 9.3 4.0 - 10.5 K/uL   RBC 4.98 3.87 - 5.11 MIL/uL   Hemoglobin 14.0 12.0 - 15.0 g/dL   HCT 42.7 36.0 - 46.0 %   MCV 85.7 80.0 - 100.0 fL   MCH 28.1 26.0 - 34.0 pg   MCHC 32.8 30.0 - 36.0 g/dL   RDW 15.0 11.5 - 15.5 %   Platelets 330 150 - 400 K/uL   nRBC 0.0 0.0 - 0.2 %    Comment: Performed at [facility], [address] [place], [place] [address]  Glucose, capillary     Status: Abnormal   Collection Time: 06/02/18  7:30 AM  Result Value Ref Range   Glucose-Capillary 118 (H) 70 - 99 mg/dL  Glucose, capillary     Status: Abnormal   Collection Time: 06/02/18 11:34 AM  Result Value Ref Range   Glucose-Capillary 140 (H) 70 - 99 mg/dL  Glucose, capillary     Status: Abnormal   Collection Time: 06/02/18  5:10 PM  Result Value Ref Range   Glucose-Capillary 112 (H) 70 - 99 mg/dL   Comment 1  Notify RN    Comment 2 Document in Chart   Glucose, capillary     Status: Abnormal   Collection Time: 06/02/18  9:15 PM  Result Value Ref Range   Glucose-Capillary 134 (H) 70 - 99 mg/dL   Comment 1 Notify RN    Comment 2 Document in Chart   Basic metabolic panel     Status: Abnormal   Collection Time: 06/03/18  5:04 AM  Result Value Ref Range   Sodium 145 135 - 145 mmol/L   Potassium 3.2 (L) 3.5 - 5.1 mmol/L    Comment: DELTA CHECK NOTED   Chloride 117 (H) 98 - 111 mmol/L   CO2 20 (L) 22 - 32 mmol/L   Glucose, Bld 126 (H) 70 - 99 mg/dL   BUN 12 6 - 20 mg/dL   Creatinine, Ser 0.39 (L) 0.44 - 1.00 mg/dL   Calcium 8.9 8.9 - 10.3 mg/dL   GFR calc non Af Amer >60 >60 mL/min   GFR calc Af Amer >60 >60 mL/min   Anion gap 8 5 - 15    Comment: Performed at [facility], [address] [place], [place] [address]  Glucose, capillary     Status: Abnormal   Collection Time: 06/03/18  8:21 AM  Result Value Ref Range    Glucose-Capillary 137 (H) 70 - 99 mg/dL    ABGS No results for input(s): PHART, PO2ART, TCO2, HCO3 in the last 72 hours.  Invalid input(s): PCO2 CULTURES Recent Results (from the past 240 hour(s))  Culture, respiratory     Status: None   Collection Time: 05/25/18  1:09 AM  Result Value Ref Range Status   Specimen Description   Final    SPUTUM Performed at [facility], [address], [place], [place] [address]    Special Requests   Final    NONE Performed at [facility], [address] [place], [place] [address]    Gram Stain   Final    RARE WBC PRESENT,BOTH PMN AND MONONUCLEAR RARE GRAM POSITIVE RODS RARE BUDDING YEAST SEEN RARE SQUAMOUS EPITHELIAL CELLS PRESENT Performed at [facility], [place] [address] [place], [place] [address]    Culture FEW CANDIDA DUBLINIENSIS  Final   Report Status 05/27/2018 FINAL  Final   Studies/Results: Dg Chest Port 1 View  Result Date: 06/03/2018 CLINICAL DATA:  Pneumonia; history of CHF, current smoker, multiple scleroses, diabetes. EXAM: PORTABLE CHEST 1 VIEW COMPARISON:  Portable chest x-ray of May 30, 2018 FINDINGS: The lungs remain mildly hypoinflated. Diffuse interstitial infiltrates persist. The hemidiaphragms remain obscured. The cardiac silhouette is nearly totally obscured. The central pulmonary vascularity is prominent. There is calcification in the wall of the aortic arch. The bony thorax exhibits no acute abnormality. IMPRESSION: Stable to slightly worsened appearance of both lungs consistent with CHF. Underlying bilateral pneumonia is suspected as well. Stable small to moderate-sized bilateral pleural effusions. Thoracic aortic atherosclerosis. Electronically Signed   By: David  Martinique M.D.   On: 06/03/2018 07:41    Medications:  Prior to Admission:  Medications Prior to Admission  Medication Sig Dispense Refill Last Dose  . amLODipine (NORVASC) 2.5 MG tablet Take 7.5 mg by mouth daily.   05/23/2018 at 1000  . aspirin  325 MG EC tablet Take 81 mg by mouth daily.    05/23/2018 at 1000  . atorvastatin (LIPITOR) 40 MG tablet Take 40 mg by mouth daily.   Past Week at 2100  . Dimethyl Fumarate (TECFIDERA) 240 MG CPDR Take  by mouth 2 (two) times daily.   05/23/2018 at 1000  . divalproex (DEPAKOTE SPRINKLE) 125 MG capsule Take 500 mg by mouth 2 (two) times daily.   05/23/2018 at 1000  . furosemide (LASIX) 20 MG tablet Take 20 mg by mouth daily.   05/23/2018 at 1700  . ipratropium-albuterol (DUONEB) 0.5-2.5 (3) MG/3ML SOLN Take 3 mLs by nebulization every 6 (six) hours as needed.   05/23/2018 at Unknown time  . levETIRAcetam (KEPPRA) 100 MG/ML solution Take 1,500 mg by mouth 2 (two) times daily.   05/23/2018 at 1000  . Linaclotide (LINZESS) 145 MCG CAPS capsule Take 1 capsule (145 mcg total) by mouth daily. 30 capsule 5 05/23/2018 at 1000  . loratadine (CLARITIN) 10 MG tablet Take 1 tablet (10 mg total) by mouth daily. 30 tablet 1 05/23/2018 at 1000  . LORazepam (ATIVAN) 2 MG tablet Take 0.25 mg by mouth at bedtime.    Past Week at 2100  . metoprolol tartrate (LOPRESSOR) 25 MG tablet Take 50 mg by mouth 2 (two) times daily.   05/23/2018 at 1000  . Multiple Vitamin (MULTIVITAMIN WITH MINERALS) TABS Take 1 tablet by mouth daily. 30 tablet 1 05/23/2018 at 1000  . oxybutynin (DITROPAN) 5 MG tablet Take 5 mg by mouth 2 (two) times daily.   05/23/2018 at 1000  . thiamine 100 MG tablet Take 1 tablet (100 mg total) by mouth daily. 30 tablet 3 05/23/2018 at 1000  . topiramate (TOPAMAX) 25 MG tablet Take 50 mg by mouth 2 (two) times daily.   05/23/2018 at 1000  . venlafaxine XR (EFFEXOR-XR) 150 MG 24 hr capsule Take 1 capsule (150 mg total) by mouth daily. (Patient taking differently: Take 112.5 mg by mouth daily. ) 30 capsule 1 05/23/2018 at 1000  . cyclobenzaprine (FLEXERIL) 10 MG tablet Take 1 tablet (10 mg total) by mouth 2 (two) times daily. (Patient not taking: Reported on 05/24/2018) 30 tablet 1 Not Taking at Unknown time  .  levETIRAcetam (KEPPRA) 500 MG tablet Take 2 tablets (1,000 mg total) by mouth 2 (two) times daily. (Patient not taking: Reported on 05/24/2018) 60 tablet 1 Not Taking at Unknown time  . montelukast (SINGULAIR) 10 MG tablet Take 1 tablet (10 mg total) by mouth at bedtime. (Patient not taking: Reported on 05/24/2018) 30 tablet 1 Not Taking at Unknown time  . peg 3350 powder (MOVIPREP) 100 G SOLR Take 1 kit (200 g total) by mouth as directed. (Patient not taking: Reported on 05/24/2018) 1 kit 0 Not Taking at Unknown time  . simvastatin (ZOCOR) 40 MG tablet Take 1 tablet (40 mg total) by mouth at bedtime. (Patient not taking: Reported on 05/24/2018) 30 tablet 1 Not Taking at Unknown time  . sodium phosphate (FLEET) 7-19 GM/118ML ENEM Place 133 mLs (1 enema total) rectally once. (Patient not taking: Reported on 08/23/2014) 1 enema 0 Not Taking at Unknown time   Scheduled: . acetylcysteine  4 mL Nebulization BID  . atorvastatin  40 mg Oral q1800  . divalproex  500 mg Oral BID  . enoxaparin (LOVENOX) injection  40 mg Subcutaneous Q24H  . Ferrous Fumarate  1 tablet Oral BID  . insulin aspart  0-5 Units Subcutaneous QHS  . insulin aspart  0-9 Units Subcutaneous TID WC  . ipratropium-albuterol  3 mL Nebulization Q6H WA  . levETIRAcetam  1,500 mg Oral BID  . linaclotide  145 mcg Oral Daily  . LORazepam  0.25 mg Oral QHS  . mouth rinse  15 mL Mouth  Rinse BID  . metoprolol tartrate  50 mg Oral BID  . montelukast  10 mg Oral QHS  . oxybutynin  5 mg Oral BID  . topiramate  50 mg Oral BID  . venlafaxine XR  112.5 mg Oral Daily   Continuous:  [id number], albuterol  Assesment: He was admitted with healthcare associated pneumonia and had systemic inflammatory response syndrome on admission.  The systemic inflammatory response syndrome has resolved.  She still has a lot of chest congestion.  She had influenza A and has been treated.  Her situation is complicated by history of MS seizure disorder and  her poor responsiveness  She has had problems with hypoxic respiratory failure but she is off oxygen now. Principal Problem:   HCAP (healthcare-associated pneumonia) Active Problems:   Hypokalemia   History of multiple sclerosis   Seizure disorder (HCC)   SIRS (systemic inflammatory response syndrome) (HCC)   Influenza A   Acute respiratory failure with hypoxia ([place])    Plan: Check chest x-ray today    LOS: 11 days   Alonza Bogus 06/03/2018, 8:23 AM

## 2018-06-03 NOTE — Progress Notes (Signed)
PROGRESS NOTE    Alanda AmassSharon L Schroepfer  JYN:[id number] DOB: 08/06/1960 DOA: 05/23/2018 PCP: Avon GullyFanta, Tesfaye, MD    Brief Narrative:  57 year old female with a history of bipolar disorder, hypertension, multiple sclerosis, who is a resident of a skilled nursing facility, admitted to the hospital with shortness of breath, hypoxia and fevers.  Found to have healthcare associated pneumonia and influenza A.  Currently on IV antibiotics and Tamiflu.  She also has significant hypoxia and is on supplemental oxygen.  Assessment & Plan:   Principal Problem:   HCAP (healthcare-associated pneumonia) Active Problems:   Hypokalemia   History of multiple sclerosis   Seizure disorder (HCC)   SIRS (systemic inflammatory response syndrome) (HCC)   Influenza A   Acute respiratory failure with hypoxia (HCC)   Goals of care, counseling/discussion   Palliative care by specialist   1. Acute respiratory failure with hypoxia.  Secondary to pneumonia and influenza.  Patient had been requiring oxygen via high flow nasal cannula.  Today, it is noted that she is maintaining oxygen saturations in the 90s on room air.  Although she does not have any signs of distress, she has bilateral rhonchi.  Chest x-ray was repeated and showed persistent infiltrates bilaterally.  She does have a significant component of dysphasia and may be recurrently aspirating 2. Healthcare associated versus aspiration pneumonia.  Completed course of cefepime.  MRSA PCR is negative, vancomycin discontinued.  Continue current treatments.  Continue pulmonary hygiene 3. Sepsis - resolved.  4. Influenza A.  Completed a course of Tamiflu.  She is not having fevers.  Continue supportive care.  5. Hypokalemia. replace 6. Seizure disorder.  Continue on Keppra 7. Bipolar disorder.  Continue on Depakote.   8. Hypertension.  Currently on amlodipine, lisinopril and metoprolol.  Blood pressures are currently running low.  Will hold further  amlodipine 9. Hyperlipidemia.  Continue statin 10. Multiple sclerosis.  Resume home medications on discharge. 11. Goals of care.  Patient has had multiple sclerosis for the last 20 years, but sister reports that her decline has been over the past 5 years.  She has been at a nursing home for just over a year now.  She is mostly bed/wheelchair bound.  She is on modified diet including pured foods and thickened liquids.  Her sister has requested a repeat swallow evaluation.  Sister inquires about the possibility of a G-tube.  I have not recommended any feeding tube placement, since it would not improve the patient's quality of life and in fact may prolong her decline.  Palliative care has been consulted to address goals of care.  Family meeting has been scheduled for tomorrow.  DVT prophylaxis: lovenox Code Status: full code Family Communication: Discussed with sister at the bedside Disposition Plan: We will likely discharge to skilled nursing facility once goals of care have been addressed by palliative care   Consultants:   Pulmonology  Procedures:     Antimicrobials:   Cefepime 12/19>12/28  Vancomycin 12/19>12/20   Subjective: Patient is awake, sitting in bed, answers yes to most questions, does not follow commands  Objective: Vitals:   06/03/18 0655 06/03/18 0723 06/03/18 1334 06/03/18 1459  BP: 116/86  116/83   Pulse: 80  78   Resp: 16  19   Temp: 97.7 F (36.5 C)  98.3 F (36.8 C)   TempSrc: Oral  Oral   SpO2: (!) 87% 91% (!) 89% 90%  Weight:      Height:        Intake/Output  Summary (Last 24 hours) at 06/03/2018 1822 Last data filed at 06/03/2018 1224 Gross per 24 hour  Intake 180 ml  Output 1 ml  Net 179 ml   Filed Weights   05/23/18 1945 05/23/18 2207  Weight: 97.1 kg 83.8 kg    Examination:  General exam: Alert, awake Respiratory system: Bilateral rhonchi.  Respiratory effort normal. Cardiovascular system:RRR. No murmurs, rubs,  gallops. Gastrointestinal system: Abdomen is nondistended, soft and nontender. No organomegaly or masses felt. Normal bowel sounds heard. Central nervous system:  No focal neurological deficits. Extremities: No C/C/E, +pedal pulses Skin: No rashes, lesions or ulcers Psychiatry: Mostly answers questions in the affirmative.  Does not follow commands.     Data Reviewed: I have personally reviewed following labs and imaging studies  CBC: Recent Labs  Lab 05/28/18 0704 05/29/18 0654 05/30/18 0527 06/02/18 0523  WBC 7.5 7.2 7.4 9.3  NEUTROABS 3.6 4.0 3.6  --   HGB 13.7 14.0 13.2 14.0  HCT 40.8 41.8 39.6 42.7  MCV 81.8 82.9 82.0 85.7  PLT 245 295 302 330   Basic Metabolic Panel: Recent Labs  Lab 05/29/18 0654 05/30/18 0527 05/31/18 0453 06/02/18 0523 06/03/18 0504  NA 143 144 145 147* 145  K 3.3* 3.3* 3.7 4.0 3.2*  CL 112* 118* 115* 118* 117*  CO2 24 21* 22 24 20*  GLUCOSE 124* 119* 120* 111* 126*  BUN 10 14 12 15 12   CREATININE 0.54 0.39* 0.40* 0.46 0.39*  CALCIUM 8.5* 8.6* 8.5* 8.9 8.9   GFR: Estimated Creatinine Clearance: 85.3 mL/min (A) (by C-G formula based on SCr of 0.39 mg/dL (L)). Liver Function Tests: Recent Labs  Lab 05/28/18 0704 05/29/18 0654 05/30/18 0527  AST 24 26 21   ALT 18 20 19   ALKPHOS 44 48 45  BILITOT 0.7 0.7 0.5  PROT 6.9 7.0 6.8  ALBUMIN 2.6* 2.7* 2.6*   No results for input(s): LIPASE, AMYLASE in the last 168 hours. No results for input(s): AMMONIA in the last 168 hours. Coagulation Profile: No results for input(s): INR, PROTIME in the last 168 hours. Cardiac Enzymes: No results for input(s): CKTOTAL, CKMB, CKMBINDEX, TROPONINI in the last 168 hours. BNP (last 3 results) No results for input(s): PROBNP in the last 8760 hours. HbA1C: No results for input(s): HGBA1C in the last 72 hours. CBG: Recent Labs  Lab 06/02/18 1710 06/02/18 2115 06/03/18 0821 06/03/18 1135 06/03/18 1659  GLUCAP 112* 134* 137* 143* 126*   Lipid  Profile: No results for input(s): CHOL, HDL, LDLCALC, TRIG, CHOLHDL, LDLDIRECT in the last 72 hours. Thyroid Function Tests: No results for input(s): TSH, T4TOTAL, FREET4, T3FREE, THYROIDAB in the last 72 hours. Anemia Panel: No results for input(s): VITAMINB12, FOLATE, FERRITIN, TIBC, IRON, RETICCTPCT in the last 72 hours. Sepsis Labs: No results for input(s): PROCALCITON, LATICACIDVEN in the last 168 hours.  Recent Results (from the past 240 hour(s))  Culture, respiratory     Status: None   Collection Time: 05/25/18  1:09 AM  Result Value Ref Range Status   Specimen Description   Final    SPUTUM Performed at [facility], [address], [place], [place] [address]    Special Requests   Final    NONE Performed at [facility], [address] [place], [place] [address]    Gram Stain   Final    RARE WBC PRESENT,BOTH PMN AND MONONUCLEAR RARE GRAM POSITIVE RODS RARE BUDDING YEAST SEEN RARE SQUAMOUS EPITHELIAL CELLS PRESENT Performed at [facility], [address] [place], [place] [address]    Culture FEW CANDIDA DUBLINIENSIS  Final   Report Status 05/27/2018 FINAL  Final     Radiology Studies: Dg Chest Port 1 View  Result Date: 06/03/2018 CLINICAL DATA:  Pneumonia; history of CHF, current smoker, multiple scleroses, diabetes. EXAM: PORTABLE CHEST 1 VIEW COMPARISON:  Portable chest x-ray of May 30, 2018 FINDINGS: The lungs remain mildly hypoinflated. Diffuse interstitial infiltrates persist. The hemidiaphragms remain obscured. The cardiac silhouette is nearly totally obscured. The central pulmonary vascularity is prominent. There is calcification in the wall of the aortic arch. The bony thorax exhibits no acute abnormality. IMPRESSION: Stable to slightly worsened appearance of both lungs consistent with CHF. Underlying bilateral pneumonia is suspected as well. Stable small to moderate-sized bilateral pleural effusions. Thoracic aortic atherosclerosis. Electronically Signed    By: David  [place] M.D.   On: 06/03/2018 07:41   Scheduled Meds: . acetylcysteine  4 mL Nebulization BID  . atorvastatin  40 mg Oral q1800  . divalproex  500 mg Oral BID  . enoxaparin (LOVENOX) injection  40 mg Subcutaneous Q24H  . Ferrous Fumarate  1 tablet Oral BID  . insulin aspart  0-5 Units Subcutaneous QHS  . insulin aspart  0-9 Units Subcutaneous TID WC  . ipratropium-albuterol  3 mL Nebulization Q6H WA  . levETIRAcetam  1,500 mg Oral BID  . linaclotide  145 mcg Oral Daily  . LORazepam  0.25 mg Oral QHS  . mouth rinse  15 mL Mouth Rinse BID  . metoprolol tartrate  50 mg Oral BID  . montelukast  10 mg Oral QHS  . oxybutynin  5 mg Oral BID  . topiramate  50 mg Oral BID  . venlafaxine XR  112.5 mg Oral Daily   Continuous Infusions:    LOS: 11 days  \ Erick Blinks, MD [facility] Pager [phone]  If 7PM-7AM, please contact night-coverage [email or web address] Password [facility] 06/03/2018, 6:22 PM

## 2018-06-03 NOTE — Progress Notes (Signed)
Patient has not voided during the day, bladder scan shows 248cc of urine, MD informed and in and out cath done on patient

## 2018-06-03 NOTE — Consult Note (Signed)
Consultation Note Date: 06/03/2018   Patient Name: Tami Nolan  DOB: 07/06/1960  MRN: [id number]  Age / Sex: 57 y.o., female  PCP: Rosita Fire, MD Referring Physician: Kathie Dike, MD  Reason for Consultation: Establishing goals of care and Psychosocial/spiritual support  HPI/Patient Profile: 57 y.o. female  with past medical history of MS x20 years, resident of [facility] for 1.5 years, history of stroke, vascular dementia, high blood pressure, diabetes, heart failure, bipolar disorder, anxiety and depression, admitted on 05/23/2018 with healthcare acquired pneumonia versus aspiration.  Palliative consulted for goals of care.   Clinical Assessment and Goals of Care: Tami Nolan is lying quietly in bed.  She wakes easily, making and mostly keeping eye contact.  She is able to make her basic needs known but has known dementia.  There is no family at bedside at this time.  Call to sister, Tami Nolan at [phone].  She tells me that she has no questions at this time, but is planning to speak with her siblings about her sister's care.  We talked about a family meeting tomorrow.  Tami Nolan tells me that she will call her brother and get back with me for a time. Tami Nolan calls back stating that she and her brother Tami Nolan can meet at bedside tomorrow at 2:30.  Conference with hospitalist related to patient condition, goals of care discussions.   HCPOA  NEXT OF KIN - sister Tami Nolan and brother Tami Nolan.  Tami Nolan has sons but per staff they are both incarcerated.  Tami Nolan states that their parents have passed, they have another brother but he has schizophrenia.    SUMMARY OF RECOMMENDATIONS   Family meeting 12/31 at 2:30 at bedside Goals of care discussion CODE STATUS discussions  Code Status/Advance Care Planning:  Full code  Symptom Management:   Per hospitalist, no additional needs  at this time.  Palliative Prophylaxis:   Aspiration and Turn Reposition  Additional Recommendations (Limitations, Scope, Preferences):  Full Scope Treatment  Psycho-social/Spiritual:   Desire for further Chaplaincy support:no  Additional Recommendations: Caregiving  Support/Resources and Education on Hospice  Prognosis:   < 6 months 3 to 6 months or less would not be surprising based on declining functional status, frailty, likely aspiration pneumonia.  Possibly 1 month or less with recurrence of aspiration pneumonia.  Discharge Planning: Anticipate return to residential SNF      Primary Diagnoses: Present on Admission: . HCAP (healthcare-associated pneumonia) . Hypokalemia . SIRS (systemic inflammatory response syndrome) (HCC) . Influenza A . Acute respiratory failure with hypoxia ([place])   I have reviewed the medical record, interviewed the patient and family, and examined the patient. The following aspects are pertinent.  Past Medical History:  Diagnosis Date  . Anxiety   . Bipolar disorder ([place])   . CHF (congestive heart failure) ([place])   . Colon polyps 1997  . Diabetes mellitus without complication ([place])   . ETOH abuse   . Genital warts   . Hepatitis    ETOH related   .  History of electroencephalogram 08/2012   normal  . Hx of abnormal cervical Pap smear 1997  . Hypertension   . Hypokalemia   . Major depressive disorder   . Multiple sclerosis ([place])   . Rhabdomyolysis   . Stroke ([place])   . Vascular dementia [facility])    Social History   Socioeconomic History  . Marital status: Single    Spouse name: Not on file  . Number of children: 2  . Years of education: Not on file  . Highest education level: Not on file  Occupational History  . Not on file  Social Needs  . Financial resource strain: Not on file  . Food insecurity:    Worry: Not on file    Inability: Not on file  . Transportation needs:    Medical: Not on file    Non-medical: Not on file    Tobacco Use  . Smoking status: Current Every Day Smoker    Packs/day: 1.00    Years: 15.00    Pack years: 15.00    Types: Cigarettes  . Smokeless tobacco: Current User  Substance and Sexual Activity  . Alcohol use: No    Comment: Hx of ETOH abuse - dry for a couple of years.   . Drug use: No    Comment: No hx of illicit drugs   . Sexual activity: Not Currently  Lifestyle  . Physical activity:    Days per week: Not on file    Minutes per session: Not on file  . Stress: Not on file  Relationships  . Social connections:    Talks on phone: Not on file    Gets together: Not on file    Attends religious service: Not on file    Active member of club or organization: Not on file    Attends meetings of clubs or organizations: Not on file    Relationship status: Not on file  Other Topics Concern  . Not on file  Social History Narrative  . Not on file   Family History  Problem Relation Age of Onset  . Coronary artery disease Brother   . Diabetes Brother   . Hypertension Brother   . Hypertension Mother   . Diabetes Mother   . Hypertension Sister   . Alcohol abuse Other        family history    Scheduled Meds: . acetylcysteine  4 mL Nebulization BID  . atorvastatin  40 mg Oral q1800  . divalproex  500 mg Oral BID  . enoxaparin (LOVENOX) injection  40 mg Subcutaneous Q24H  . Ferrous Fumarate  1 tablet Oral BID  . insulin aspart  0-5 Units Subcutaneous QHS  . insulin aspart  0-9 Units Subcutaneous TID WC  . ipratropium-albuterol  3 mL Nebulization Q6H WA  . levETIRAcetam  1,500 mg Oral BID  . linaclotide  145 mcg Oral Daily  . LORazepam  0.25 mg Oral QHS  . mouth rinse  15 mL Mouth Rinse BID  . metoprolol tartrate  50 mg Oral BID  . montelukast  10 mg Oral QHS  . oxybutynin  5 mg Oral BID  . topiramate  50 mg Oral BID  . venlafaxine XR  112.5 mg Oral Daily   Continuous Infusions: PRN Meds:.acetaminophen, albuterol Medications Prior to Admission:  Prior to Admission  medications   Medication Sig Start Date End Date Taking? Authorizing Provider  amLODipine (NORVASC) 2.5 MG tablet Take 7.5 mg by mouth daily.   Yes [provider]  aspirin 325 MG EC tablet Take 81 mg by mouth daily.    Yes [provider]  atorvastatin (LIPITOR) 40 MG tablet Take 40 mg by mouth daily.   Yes [provider]  Dimethyl Fumarate (TECFIDERA) 240 MG CPDR Take by mouth 2 (two) times daily.   Yes [provider]  divalproex (DEPAKOTE SPRINKLE) 125 MG capsule Take 500 mg by mouth 2 (two) times daily.   Yes [provider]  furosemide (LASIX) 20 MG tablet Take 20 mg by mouth daily.   Yes [provider]  ipratropium-albuterol (DUONEB) 0.5-2.5 (3) MG/3ML SOLN Take 3 mLs by nebulization every 6 (six) hours as needed.   Yes [provider]  levETIRAcetam (KEPPRA) 100 MG/ML solution Take 1,500 mg by mouth 2 (two) times daily.   Yes [provider]  Linaclotide (LINZESS) 145 MCG CAPS capsule Take 1 capsule (145 mcg total) by mouth daily. 03/26/14  Yes Mahala Menghini, PA-C  loratadine (CLARITIN) 10 MG tablet Take 1 tablet (10 mg total) by mouth daily. 08/02/12  Yes Noemi Chapel, MD  LORazepam (ATIVAN) 2 MG tablet Take 0.25 mg by mouth at bedtime.    Yes [provider]  metoprolol tartrate (LOPRESSOR) 25 MG tablet Take 50 mg by mouth 2 (two) times daily. 08/02/12  Yes Noemi Chapel, MD  Multiple Vitamin (MULTIVITAMIN WITH MINERALS) TABS Take 1 tablet by mouth daily. 08/02/12  Yes Noemi Chapel, MD  oxybutynin (DITROPAN) 5 MG tablet Take 5 mg by mouth 2 (two) times daily.   Yes [provider]  thiamine 100 MG tablet Take 1 tablet (100 mg total) by mouth daily. 07/10/13  Yes Rosita Fire, MD  topiramate (TOPAMAX) 25 MG tablet Take 50 mg by mouth 2 (two) times daily.   Yes [provider]  venlafaxine XR (EFFEXOR-XR) 150 MG 24 hr capsule Take 1 capsule (150 mg total) by mouth daily. Patient taking  differently: Take 112.5 mg by mouth daily.  08/02/12  Yes Noemi Chapel, MD  cyclobenzaprine (FLEXERIL) 10 MG tablet Take 1 tablet (10 mg total) by mouth 2 (two) times daily. Patient not taking: Reported on 05/24/2018 08/02/12   Noemi Chapel, MD  levETIRAcetam (KEPPRA) 500 MG tablet Take 2 tablets (1,000 mg total) by mouth 2 (two) times daily. Patient not taking: Reported on 05/24/2018 07/10/13   Rosita Fire, MD  montelukast (SINGULAIR) 10 MG tablet Take 1 tablet (10 mg total) by mouth at bedtime. Patient not taking: Reported on 05/24/2018 08/02/12   Noemi Chapel, MD  peg 3350 powder (MOVIPREP) 100 G SOLR Take 1 kit (200 g total) by mouth as directed. Patient not taking: Reported on 05/24/2018 04/15/14   Daneil Dolin, MD  simvastatin (ZOCOR) 40 MG tablet Take 1 tablet (40 mg total) by mouth at bedtime. Patient not taking: Reported on 05/24/2018 08/02/12   Noemi Chapel, MD  sodium phosphate (FLEET) 7-19 GM/118ML ENEM Place 133 mLs (1 enema total) rectally once. Patient not taking: Reported on 08/23/2014 04/15/14   Daneil Dolin, MD   No Known Allergies Review of Systems  Unable to perform ROS: Mental status change    Physical Exam Vitals signs and nursing note reviewed.  Constitutional:      Comments: Makes and somewhat keeps eye contact.  Appears chronically ill and frail, able to answer yes and no questions  Cardiovascular:     Rate and Rhythm: Normal rate.  Pulmonary:     Effort: Pulmonary effort is normal. No accessory muscle usage  or respiratory distress.     Comments: Audible rhonchi Abdominal:     Palpations: Abdomen is soft.  Skin:    General: Skin is warm and dry.  Neurological:     Comments: Alert, orientation questions not asked  Psychiatric:        Mood and Affect: Mood is not anxious.        Behavior: Behavior is not agitated.     Vital Signs: BP 116/83 (BP Location: Left Arm)   Pulse 78   Temp 98.3 F (36.8 C) (Oral)   Resp 19   Ht 5' 7" (1.702 m)   Wt  83.8 kg   SpO2 (!) 89%   BMI 28.94 kg/m  Pain Scale: 0-10 POSS *See Group Information*: 1-Acceptable,Awake and alert Pain Score: 0-No pain   SpO2: SpO2: (!) 89 % O2 Device:SpO2: (!) 89 % O2 Flow Rate: .O2 Flow Rate (L/min): 4 L/min  IO: Intake/output summary:   Intake/Output Summary (Last 24 hours) at 06/03/2018 1414 Last data filed at 06/03/2018 1224 Gross per 24 hour  Intake 300 ml  Output 1 ml  Net 299 ml    LBM: Last BM Date: (Unable to assess) Baseline Weight: Weight: 97.1 kg Most recent weight: Weight: 83.8 kg     Palliative Assessment/Data:   Flowsheet Rows     Most Recent Value  Intake Tab  Referral Department  Hospitalist  Unit at Time of Referral  Med/Surg Unit  Palliative Care Primary Diagnosis  Pulmonary  Date Notified  06/03/18  Palliative Care Type  New Palliative care  Reason for referral  Clarify Goals of Care  Date of Admission  05/23/18  Date first seen by Palliative Care  06/03/18  # of days Palliative referral response time  0 Day(s)  # of days IP prior to Palliative referral  11  Clinical Assessment  Palliative Performance Scale Score  30%  Pain Max last 24 hours  Not able to report  Pain Min Last 24 hours  Not able to report  Dyspnea Max Last 24 Hours  Not able to report  Dyspnea Min Last 24 hours  Not able to report  Psychosocial & Spiritual Assessment  Palliative Care Outcomes      Time In: 1420 Time Out: 1510 Time Total: 50 minutes  Greater than 50%  of this time was spent counseling and coordinating care related to the above assessment and plan.  Signed by:  A , NP   Please contact Palliative Medicine Team phone at [phone] for questions and concerns.  For individual provider: See Amion 

## 2018-06-04 ENCOUNTER — Inpatient Hospital Stay (HOSPITAL_COMMUNITY): Payer: Medicare Other

## 2018-06-04 DIAGNOSIS — Z515 Encounter for palliative care: Secondary | ICD-10-CM

## 2018-06-04 DIAGNOSIS — Z7189 Other specified counseling: Secondary | ICD-10-CM

## 2018-06-04 LAB — GLUCOSE, CAPILLARY
Glucose-Capillary: 109 mg/dL — ABNORMAL HIGH (ref 70–99)
Glucose-Capillary: 165 mg/dL — ABNORMAL HIGH (ref 70–99)
Glucose-Capillary: 137 mg/dL — ABNORMAL HIGH (ref 70–99)
Glucose-Capillary: 128 mg/dL — ABNORMAL HIGH (ref 70–99)

## 2018-06-04 NOTE — Evaluation (Signed)
Clinical/Bedside Swallow Evaluation Patient Details  Name: Tami Nolan MRN: [id number] Date of Birth: 05/29/1961  Today's Date: 06/04/2018 Time: SLP Start Time (ACUTE ONLY): 1120 SLP Stop Time (ACUTE ONLY): 1144 SLP Time Calculation (min) (ACUTE ONLY): 24 min  Past Medical History:  Past Medical History:  Diagnosis Date  . Anxiety   . Bipolar disorder (HCC)   . CHF (congestive heart failure) (HCC)   . Colon polyps 1997  . Diabetes mellitus without complication (HCC)   . ETOH abuse   . Genital warts   . Hepatitis    ETOH related   . History of electroencephalogram 08/2012   normal  . Hx of abnormal cervical Pap smear 1997  . Hypertension   . Hypokalemia   . Major depressive disorder   . Multiple sclerosis (HCC)   . Rhabdomyolysis   . Stroke (HCC)   . Vascular dementia [facility](HCC)    Past Surgical History:  Past Surgical History:  Procedure Laterality Date  . bilatetral tubal ligation     HPI:  57 year old female with a history of bipolar disorder, hypertension, multiple sclerosis, who is a resident of a skilled nursing facility, admitted to the hospital with shortness of breath, hypoxia and fevers.  Found to have healthcare associated pneumonia and influenza A.  Currently on IV antibiotics and Tamiflu.  She also has significant hypoxia and is on supplemental oxygen. BSE/MBS requested. Pt reportedly on puree and NTL at SNF.    Assessment / Plan / Recommendation Clinical Impression  Clinical swallow evaluation completed at bedside. Pt alert and cooperative, but has difficulty following commands. She is able to imitate simple motor movements, but unable to generate a cough. Pt with audible secretions prior to po administration. Limited trials due to signs of reduced airway protection. It is difficult to discern safety with po intake despite texture modifications due to severity of audible secretions prior to po trials. Pt appears to be at high risk for aspiration and also has poor  oral hygiene/dentition. Will proceed with MBSS today to help provide Pt, family, and medical team additional information to assist with goals of care. Above to RN and MD.  SLP Visit Diagnosis: Dysphagia, oropharyngeal phase (R13.12)    Aspiration Risk  Moderate aspiration risk;Risk for inadequate nutrition/hydration    Diet Recommendation NPO        Other  Recommendations Oral Care Recommendations: Oral care QID;Staff/trained caregiver to provide oral care   Follow up Recommendations Skilled Nursing facility      Frequency and Duration min 2x/week  1 week       Prognosis Prognosis for Safe Diet Advancement: Guarded Barriers to Reach Goals: Severity of deficits;Cognitive deficits      Swallow Study   General Date of Onset: 05/23/18 HPI: [age] year old female with a history of bipolar disorder, hypertension, multiple sclerosis, who is a resident of a skilled nursing facility, admitted to the hospital with shortness of breath, hypoxia and fevers.  Found to have healthcare associated pneumonia and influenza A.  Currently on IV antibiotics and Tamiflu.  She also has significant hypoxia and is on supplemental oxygen. BSE/MBS requested. Pt reportedly on puree and NTL at SNF.  Type of Study: Bedside Swallow Evaluation Previous Swallow Assessment: None on record Diet Prior to this Study: Nectar-thick liquids;Dysphagia 1 (puree) Temperature Spikes Noted: No Respiratory Status: Nasal cannula History of Recent Intubation: No Behavior/Cognition: Alert;Cooperative;Requires cueing;Doesn't follow directions Oral Cavity Assessment: Dry;Dried secretions Oral Care Completed by SLP: Yes Oral Cavity - Dentition: Poor condition Vision:  Impaired for self-feeding Self-Feeding Abilities: Total assist Patient Positioning: Upright in bed Baseline Vocal Quality: Normal Volitional Cough: Weak Volitional Swallow: Unable to elicit    Oral/Motor/Sensory Function Overall Oral Motor/Sensory Function:  Generalized oral weakness   Ice Chips Ice chips: Impaired Presentation: Spoon Oral Phase Impairments: Reduced lingual movement/coordination Pharyngeal Phase Impairments: Suspected delayed Swallow;Decreased hyoid-laryngeal movement   Thin Liquid Thin Liquid: Not tested    Nectar Thick Nectar Thick Liquid: Impaired Presentation: Spoon Pharyngeal Phase Impairments: Suspected delayed Swallow;Decreased hyoid-laryngeal movement;Wet Vocal Quality;Cough - Delayed   Honey Thick Honey Thick Liquid: Not tested   Puree Puree: Impaired Presentation: Spoon Pharyngeal Phase Impairments: Suspected delayed Swallow;Decreased hyoid-laryngeal movement   Solid     Solid: Not tested     Thank you,  Havery Moros, CCC-SLP [phone]  Tami Nolan 06/04/2018,12:07 PM

## 2018-06-04 NOTE — Progress Notes (Signed)
PT Cancellation Note  Patient Details Name: Tami Nolan MRN: [id number] DOB: [birth year]   Cancelled Treatment:    Reason Eval/Treat Not Completed: Patient at procedure or test/unavailable   Katina Dung. Hartnett-Rands, MS, PT [place] PT [facility] [phone] 06/04/2018, 1:33 PM

## 2018-06-04 NOTE — Progress Notes (Signed)
Subjective: She is overall about the same.  She had some trouble with urinary retention last night.  No other new complaints noted.  Palliative care is involved and plans family meeting today  Objective: Vital signs in last 24 hours: Temp:  [98.2 F (36.8 C)-98.3 F (36.8 C)] 98.3 F (36.8 C) (12/31 0515) Pulse Rate:  [78-93] 80 (12/31 0515) Resp:  [16-19] 18 (12/31 0515) BP: (100-116)/(76-83) 108/76 (12/31 0515) SpO2:  [86 %-95 %] 86 % (12/31 0808) Weight change:  Last BM Date: 06/03/18  Intake/Output from previous day: 12/30 0701 - 12/31 0700 In: 180 [P.O.:180] Out: 250 [Urine:250]  PHYSICAL EXAM General appearance: She is still sluggish.  She does respond but then goes back to sleep Resp: rhonchi bilaterally Cardio: regular rate and rhythm, S1, S2 normal, no murmur, click, rub or gallop GI: soft, non-tender; bowel sounds normal; no masses,  no organomegaly Extremities: extremities normal, atraumatic, no cyanosis or edema  Lab Results:  Results for orders placed or performed during the hospital encounter of 05/23/18 (from the past 48 hour(s))  Glucose, capillary     Status: Abnormal   Collection Time: 06/02/18 11:34 AM  Result Value Ref Range   Glucose-Capillary 140 (H) 70 - 99 mg/dL  Glucose, capillary     Status: Abnormal   Collection Time: 06/02/18  5:10 PM  Result Value Ref Range   Glucose-Capillary 112 (H) 70 - 99 mg/dL   Comment 1 Notify RN    Comment 2 Document in Chart   Glucose, capillary     Status: Abnormal   Collection Time: 06/02/18  9:15 PM  Result Value Ref Range   Glucose-Capillary 134 (H) 70 - 99 mg/dL   Comment 1 Notify RN    Comment 2 Document in Chart   Basic metabolic panel     Status: Abnormal   Collection Time: 06/03/18  5:04 AM  Result Value Ref Range   Sodium 145 135 - 145 mmol/L   Potassium 3.2 (L) 3.5 - 5.1 mmol/L    Comment: DELTA CHECK NOTED   Chloride 117 (H) 98 - 111 mmol/L   CO2 20 (L) 22 - 32 mmol/L   Glucose, Bld 126 (H) 70 -  99 mg/dL   BUN 12 6 - 20 mg/dL   Creatinine, Ser 0.39 (L) 0.44 - 1.00 mg/dL   Calcium 8.9 8.9 - 10.3 mg/dL   GFR calc non Af Amer >60 >60 mL/min   GFR calc Af Amer >60 >60 mL/min   Anion gap 8 5 - 15    Comment: Performed at [facility], [address] [place], [place] [address]  Glucose, capillary     Status: Abnormal   Collection Time: 06/03/18  8:21 AM  Result Value Ref Range   Glucose-Capillary 137 (H) 70 - 99 mg/dL  Glucose, capillary     Status: Abnormal   Collection Time: 06/03/18 11:35 AM  Result Value Ref Range   Glucose-Capillary 143 (H) 70 - 99 mg/dL  Glucose, capillary     Status: Abnormal   Collection Time: 06/03/18  4:59 PM  Result Value Ref Range   Glucose-Capillary 126 (H) 70 - 99 mg/dL  Glucose, capillary     Status: Abnormal   Collection Time: 06/03/18  9:37 PM  Result Value Ref Range   Glucose-Capillary 137 (H) 70 - 99 mg/dL  Glucose, capillary     Status: Abnormal   Collection Time: 06/04/18  7:49 AM  Result Value Ref Range   Glucose-Capillary 109 (H) 70 -  99 mg/dL    ABGS No results for input(s): PHART, PO2ART, TCO2, HCO3 in the last 72 hours.  Invalid input(s): PCO2 CULTURES No results found for this or any previous visit (from the past 240 hour(s)). Studies/Results: Dg Chest Port 1 View  Result Date: 06/03/2018 CLINICAL DATA:  Pneumonia; history of CHF, current smoker, multiple scleroses, diabetes. EXAM: PORTABLE CHEST 1 VIEW COMPARISON:  Portable chest x-ray of May 30, 2018 FINDINGS: The lungs remain mildly hypoinflated. Diffuse interstitial infiltrates persist. The hemidiaphragms remain obscured. The cardiac silhouette is nearly totally obscured. The central pulmonary vascularity is prominent. There is calcification in the wall of the aortic arch. The bony thorax exhibits no acute abnormality. IMPRESSION: Stable to slightly worsened appearance of both lungs consistent with CHF. Underlying bilateral pneumonia is suspected as well. Stable small to  moderate-sized bilateral pleural effusions. Thoracic aortic atherosclerosis. Electronically Signed   By: David  Martinique M.D.   On: 06/03/2018 07:41    Medications:  Prior to Admission:  Medications Prior to Admission  Medication Sig Dispense Refill Last Dose  . amLODipine (NORVASC) 2.5 MG tablet Take 7.5 mg by mouth daily.   05/23/2018 at 1000  . aspirin 325 MG EC tablet Take 81 mg by mouth daily.    05/23/2018 at 1000  . atorvastatin (LIPITOR) 40 MG tablet Take 40 mg by mouth daily.   Past Week at 2100  . Dimethyl Fumarate (TECFIDERA) 240 MG CPDR Take by mouth 2 (two) times daily.   05/23/2018 at 1000  . divalproex (DEPAKOTE SPRINKLE) 125 MG capsule Take 500 mg by mouth 2 (two) times daily.   05/23/2018 at 1000  . furosemide (LASIX) 20 MG tablet Take 20 mg by mouth daily.   05/23/2018 at 1700  . ipratropium-albuterol (DUONEB) 0.5-2.5 (3) MG/3ML SOLN Take 3 mLs by nebulization every 6 (six) hours as needed.   05/23/2018 at Unknown time  . levETIRAcetam (KEPPRA) 100 MG/ML solution Take 1,500 mg by mouth 2 (two) times daily.   05/23/2018 at 1000  . Linaclotide (LINZESS) 145 MCG CAPS capsule Take 1 capsule (145 mcg total) by mouth daily. 30 capsule 5 05/23/2018 at 1000  . loratadine (CLARITIN) 10 MG tablet Take 1 tablet (10 mg total) by mouth daily. 30 tablet 1 05/23/2018 at 1000  . LORazepam (ATIVAN) 2 MG tablet Take 0.25 mg by mouth at bedtime.    Past Week at 2100  . metoprolol tartrate (LOPRESSOR) 25 MG tablet Take 50 mg by mouth 2 (two) times daily.   05/23/2018 at 1000  . Multiple Vitamin (MULTIVITAMIN WITH MINERALS) TABS Take 1 tablet by mouth daily. 30 tablet 1 05/23/2018 at 1000  . oxybutynin (DITROPAN) 5 MG tablet Take 5 mg by mouth 2 (two) times daily.   05/23/2018 at 1000  . thiamine 100 MG tablet Take 1 tablet (100 mg total) by mouth daily. 30 tablet 3 05/23/2018 at 1000  . topiramate (TOPAMAX) 25 MG tablet Take 50 mg by mouth 2 (two) times daily.   05/23/2018 at 1000  . venlafaxine  XR (EFFEXOR-XR) 150 MG 24 hr capsule Take 1 capsule (150 mg total) by mouth daily. (Patient taking differently: Take 112.5 mg by mouth daily. ) 30 capsule 1 05/23/2018 at 1000  . cyclobenzaprine (FLEXERIL) 10 MG tablet Take 1 tablet (10 mg total) by mouth 2 (two) times daily. (Patient not taking: Reported on 05/24/2018) 30 tablet 1 Not Taking at Unknown time  . levETIRAcetam (KEPPRA) 500 MG tablet Take 2 tablets (1,000 mg total) by mouth 2 (  two) times daily. (Patient not taking: Reported on 05/24/2018) 60 tablet 1 Not Taking at Unknown time  . montelukast (SINGULAIR) 10 MG tablet Take 1 tablet (10 mg total) by mouth at bedtime. (Patient not taking: Reported on 05/24/2018) 30 tablet 1 Not Taking at Unknown time  . peg 3350 powder (MOVIPREP) 100 G SOLR Take 1 kit (200 g total) by mouth as directed. (Patient not taking: Reported on 05/24/2018) 1 kit 0 Not Taking at Unknown time  . simvastatin (ZOCOR) 40 MG tablet Take 1 tablet (40 mg total) by mouth at bedtime. (Patient not taking: Reported on 05/24/2018) 30 tablet 1 Not Taking at Unknown time  . sodium phosphate (FLEET) 7-19 GM/118ML ENEM Place 133 mLs (1 enema total) rectally once. (Patient not taking: Reported on 08/23/2014) 1 enema 0 Not Taking at Unknown time   Scheduled: . acetylcysteine  4 mL Nebulization BID  . atorvastatin  40 mg Oral q1800  . divalproex  500 mg Oral BID  . enoxaparin (LOVENOX) injection  40 mg Subcutaneous Q24H  . Ferrous Fumarate  1 tablet Oral BID  . insulin aspart  0-5 Units Subcutaneous QHS  . insulin aspart  0-9 Units Subcutaneous TID WC  . ipratropium-albuterol  3 mL Nebulization Q6H WA  . levETIRAcetam  1,500 mg Oral BID  . linaclotide  145 mcg Oral Daily  . LORazepam  0.25 mg Oral QHS  . mouth rinse  15 mL Mouth Rinse BID  . metoprolol tartrate  50 mg Oral BID  . montelukast  10 mg Oral QHS  . oxybutynin  5 mg Oral BID  . topiramate  50 mg Oral BID  . venlafaxine XR  112.5 mg Oral Daily    Continuous:  [id number], albuterol  Assesment: She is admitted with healthcare associated pneumonia probably aspiration.  I think she has recurrent aspiration.  She also has had influenza A.  She has hypoxic respiratory failure.  She is got multiple sclerosis which has gotten worse in the last several years. Principal Problem:   HCAP (healthcare-associated pneumonia) Active Problems:   Hypokalemia   History of multiple sclerosis   Seizure disorder (HCC)   SIRS (systemic inflammatory response syndrome) (HCC)   Influenza A   Acute respiratory failure with hypoxia (HCC)   Goals of care, counseling/discussion   Palliative care by specialist    Plan: Continue treatments.  Palliative care meeting later.    LOS: 12 days   Alonza Bogus 06/04/2018, 8:37 AM

## 2018-06-04 NOTE — Progress Notes (Signed)
PROGRESS NOTE    RUDEAN ICENHOUR  JXB:[id number] DOB: December 17, 1960 DOA: 05/23/2018 PCP: Rosita Fire, MD    Brief Narrative:  57 year old female with a history of bipolar disorder, hypertension, multiple sclerosis, who is a resident of a skilled nursing facility, admitted to the hospital with shortness of breath, hypoxia and fevers.  Found to have healthcare associated pneumonia and influenza A.  Currently on IV antibiotics and Tamiflu.  She also has significant hypoxia and is on supplemental oxygen.  Assessment & Plan:   Principal Problem:   HCAP (healthcare-associated pneumonia) Active Problems:   Hypokalemia   History of multiple sclerosis   Seizure disorder (HCC)   SIRS (systemic inflammatory response syndrome) (HCC)   Influenza A   Acute respiratory failure with hypoxia (HCC)   Goals of care, counseling/discussion   Palliative care by specialist   DNR (do not resuscitate) discussion   1. Acute respiratory failure with hypoxia.  Secondary to pneumonia and influenza as well as recurrent aspiration.  Patient had been requiring oxygen via high flow nasal cannula.  She is currently on 4 L.  Continue to wean down as tolerated.  Although she does not have any signs of distress, she has bilateral rhonchi.  Chest x-ray was repeated and showed persistent infiltrates bilaterally.  She does have a significant component of dysphagia and may be recurrently aspirating 2. Healthcare associated versus aspiration pneumonia.  Completed course of cefepime.  MRSA PCR is negative, vancomycin discontinued.  She is afebrile.  Continue pulmonary hygiene 3. Sepsis - resolved.  4. Dysphagia.  Seen by speech therapy and underwent modified barium swallow.  Recommendations for continued pured diet with nectar thick liquids. 5. Influenza A.  Completed a course of Tamiflu.  She is not having fevers.  Continue supportive care.  6. Hypokalemia. replace 7. Seizure disorder.  Continue on Keppra 8. Bipolar  disorder.  Continue on Depakote.   9. Hypertension.  Currently on lisinopril and metoprolol.  Blood pressures have been running low.  Amlodipine on hold 10. Hyperlipidemia.  Continue statin 11. Multiple sclerosis.  Resume home medications on discharge. 12. Goals of care.  Patient has had multiple sclerosis for the last 20 years, but sister reports that her decline has been over the past 5 years.  She has been at a nursing home for just over a year now.  She is mostly bed/wheelchair bound.  She is on modified diet including pured foods and thickened liquids.  Sister inquires about the possibility of a G-tube.  I have not recommended any feeding tube placement, since it would not improve the patient's quality of life and in fact may prolong her decline.  Palliative care following and had an extensive discussion with the family.  After this meeting, I also met with the family and tried to answer their questions.  They have not made any decisions regarding CODE STATUS.  Regarding any feeding tubes, they wish to continue with modified dysphagia diet with thickened liquids for the time being.  DVT prophylaxis: lovenox Code Status: full code Family Communication: Discussed with sister, brother and other family members at the bedside Disposition Plan:-To skilled nursing facility if respiratory status improves in the next 1 to 2 days   Consultants:   Pulmonology  Palliative care  Procedures:     Antimicrobials:   Cefepime 12/19>12/28  Vancomycin 12/19>12/20   Subjective: Sitting up in bed, awake, does not appear to be short of breath, does not provide any meaningful history  Objective: Vitals:   06/04/18  5003 06/04/18 1000 06/04/18 1413 06/04/18 1914  BP:      Pulse:      Resp:      Temp:      TempSrc:      SpO2: (!) 86% 90% 93% 93%  Weight:      Height:        Intake/Output Summary (Last 24 hours) at 06/04/2018 1943 Last data filed at 06/04/2018 1801 Gross per 24 hour  Intake  240 ml  Output -  Net 240 ml   Filed Weights   05/23/18 1945 05/23/18 2207  Weight: 97.1 kg 83.8 kg    Examination:  General exam: Alert, awake, no distress Respiratory system: Bilateral rhonchi. Respiratory effort normal. Cardiovascular system:RRR. No murmurs, rubs, gallops. Gastrointestinal system: Abdomen is nondistended, soft and nontender. No organomegaly or masses felt. Normal bowel sounds heard. Central nervous system: No focal neurological deficits. Extremities: No C/C/E, +pedal pulses Skin: No rashes, lesions or ulcers Psychiatry: Appears to lack insight, only answers yes to questions.    Data Reviewed: I have personally reviewed following labs and imaging studies  CBC: Recent Labs  Lab 05/29/18 0654 05/30/18 0527 06/02/18 0523  WBC 7.2 7.4 9.3  NEUTROABS 4.0 3.6  --   HGB 14.0 13.2 14.0  HCT 41.8 39.6 42.7  MCV 82.9 82.0 85.7  PLT 295 302 704   Basic Metabolic Panel: Recent Labs  Lab 05/29/18 0654 05/30/18 0527 05/31/18 0453 06/02/18 0523 06/03/18 0504  NA 143 144 145 147* 145  K 3.3* 3.3* 3.7 4.0 3.2*  CL 112* 118* 115* 118* 117*  CO2 24 21* 22 24 20*  GLUCOSE 124* 119* 120* 111* 126*  BUN '10 14 12 15 12  '$ CREATININE 0.54 0.39* 0.40* 0.46 0.39*  CALCIUM 8.5* 8.6* 8.5* 8.9 8.9   GFR: Estimated Creatinine Clearance: 85.3 mL/min (A) (by C-G formula based on SCr of 0.39 mg/dL (L)). Liver Function Tests: Recent Labs  Lab 05/29/18 0654 05/30/18 0527  AST 26 21  ALT 20 19  ALKPHOS 48 45  BILITOT 0.7 0.5  PROT 7.0 6.8  ALBUMIN 2.7* 2.6*   No results for input(s): LIPASE, AMYLASE in the last 168 hours. No results for input(s): AMMONIA in the last 168 hours. Coagulation Profile: No results for input(s): INR, PROTIME in the last 168 hours. Cardiac Enzymes: No results for input(s): CKTOTAL, CKMB, CKMBINDEX, TROPONINI in the last 168 hours. BNP (last 3 results) No results for input(s): PROBNP in the last 8760 hours. HbA1C: No results for  input(s): HGBA1C in the last 72 hours. CBG: Recent Labs  Lab 06/03/18 1659 06/03/18 2137 06/04/18 0749 06/04/18 1113 06/04/18 1638  GLUCAP 126* 137* 109* 165* 137*   Lipid Profile: No results for input(s): CHOL, HDL, LDLCALC, TRIG, CHOLHDL, LDLDIRECT in the last 72 hours. Thyroid Function Tests: No results for input(s): TSH, T4TOTAL, FREET4, T3FREE, THYROIDAB in the last 72 hours. Anemia Panel: No results for input(s): VITAMINB12, FOLATE, FERRITIN, TIBC, IRON, RETICCTPCT in the last 72 hours. Sepsis Labs: No results for input(s): PROCALCITON, LATICACIDVEN in the last 168 hours.  No results found for this or any previous visit (from the past 240 hour(s)).   Radiology Studies: Dg Chest Port 1 View  Result Date: 06/03/2018 CLINICAL DATA:  Pneumonia; history of CHF, current smoker, multiple scleroses, diabetes. EXAM: PORTABLE CHEST 1 VIEW COMPARISON:  Portable chest x-ray of May 30, 2018 FINDINGS: The lungs remain mildly hypoinflated. Diffuse interstitial infiltrates persist. The hemidiaphragms remain obscured. The cardiac silhouette is nearly totally  obscured. The central pulmonary vascularity is prominent. There is calcification in the wall of the aortic arch. The bony thorax exhibits no acute abnormality. IMPRESSION: Stable to slightly worsened appearance of both lungs consistent with CHF. Underlying bilateral pneumonia is suspected as well. Stable small to moderate-sized bilateral pleural effusions. Thoracic aortic atherosclerosis. Electronically Signed   By: David  Martinique M.D.   On: 06/03/2018 07:41   Dg Swallowing Func-speech Pathology  Result Date: 06/04/2018 Objective Swallowing Evaluation: Type of Study: MBS-Modified Barium Swallow Study  Patient Details Name: AYA GEISEL MRN: [id number] Date of Birth: Jul 12, 1960 Today's Date: 06/04/2018 Time: SLP Start Time (ACUTE ONLY): 1310 -SLP Stop Time (ACUTE ONLY): 1336 SLP Time Calculation (min) (ACUTE ONLY): 26 min Past Medical  History: Past Medical History: Diagnosis Date . Anxiety  . Bipolar disorder ([place])  . CHF (congestive heart failure) ([place])  . Colon polyps 1997 . Diabetes mellitus without complication ([place])  . ETOH abuse  . Genital warts  . Hepatitis   ETOH related  . History of electroencephalogram 08/2012  normal . Hx of abnormal cervical Pap smear 1997 . Hypertension  . Hypokalemia  . Major depressive disorder  . Multiple sclerosis ([place])  . Rhabdomyolysis  . Stroke ([place])  . Vascular dementia [facility])  Past Surgical History: Past Surgical History: Procedure Laterality Date . bilatetral tubal ligation   HPI: 57 year old female with a history of bipolar disorder, hypertension, multiple sclerosis, who is a resident of a skilled nursing facility, admitted to the hospital with shortness of breath, hypoxia and fevers.  Found to have healthcare associated pneumonia and influenza A.  Currently on IV antibiotics and Tamiflu.  She also has significant hypoxia and is on supplemental oxygen. BSE/MBS requested. Pt reportedly on puree and NTL at SNF.  Subjective: "Oh!" Assessment / Plan / Recommendation CHL IP CLINICAL IMPRESSIONS 06/04/2018 Clinical Impression Pt presents with moderate oropharyngeal phase dysphagia characterized by lingual pumping, lack of rotary mastication, reduced lingual movement, premature spillage, delay in swallow trigger, and reduced laryngeal closure resulting in lack of swallow trigger when Pt presented with cup sips. Pt with improved swallow trigger when liquids presented via spoon. Trace aspiration of thins occurred before and during the swallow due to premature spillage and delay in swallow trigger. Aspiration was generally sensed, but delayed cough and was not removed from trachea. Pt aspirated straw sips (trace amount) of NTL due to premature spillage and reduced laryngeal closure. Barium tablet was swallowed whole in puree without incident. Recommend D1/puree with NTL presented via spoon presentation with pillows  behind head for positioning. OK for po meds whole or crushed in puree. Pt will need 1:1 assist for all eating and drinking and full assist for oral care before and after meals. Pt is at risk for aspiration and dehydration given cognitive deficits and dysphagia. SLP will follow during acute stay and recommend f/u SLP services at SNF.  SLP Visit Diagnosis Dysphagia, oropharyngeal phase (R13.12) Attention and concentration deficit following -- Frontal lobe and executive function deficit following -- Impact on safety and function Moderate aspiration risk;Risk for inadequate nutrition/hydration   CHL IP TREATMENT RECOMMENDATION 06/04/2018 Treatment Recommendations Therapy as outlined in treatment plan below   Prognosis 06/04/2018 Prognosis for Safe Diet Advancement Guarded Barriers to Reach Goals Severity of deficits;Cognitive deficits Barriers/Prognosis Comment -- CHL IP DIET RECOMMENDATION 06/04/2018 SLP Diet Recommendations Dysphagia 1 (Puree) solids;Nectar thick liquid Liquid Administration via Spoon;No straw Medication Administration Whole meds with puree Compensations Slow rate;Small sips/bites Postural Changes Remain semi-upright after after feeds/meals (  Comment);Seated upright at 90 degrees   CHL IP OTHER RECOMMENDATIONS 06/04/2018 Recommended Consults -- Oral Care Recommendations Oral care before and after PO;Staff/trained caregiver to provide oral care Other Recommendations Order thickener from pharmacy;Clarify dietary restrictions;Prohibited food (jello, ice cream, thin soups);Remove water pitcher   CHL IP FOLLOW UP RECOMMENDATIONS 06/04/2018 Follow up Recommendations Skilled Nursing facility   [facility] IP FREQUENCY AND DURATION 06/04/2018 Speech Therapy Frequency (ACUTE ONLY) min 2x/week Treatment Duration 1 week      CHL IP ORAL PHASE 06/04/2018 Oral Phase Impaired Oral - Pudding Teaspoon -- Oral - Pudding Cup -- Oral - Honey Teaspoon -- Oral - Honey Cup -- Oral - Nectar Teaspoon Weak lingual manipulation;Lingual  pumping Oral - Nectar Cup Weak lingual manipulation;Lingual pumping;Premature spillage Oral - Nectar Straw -- Oral - Thin Teaspoon Premature spillage Oral - Thin Cup -- Oral - Thin Straw -- Oral - Puree Lingual pumping Oral - Mech Soft Impaired mastication;Lingual pumping Oral - Regular -- Oral - Multi-Consistency -- Oral - Pill Delayed oral transit Oral Phase - Comment --  CHL IP PHARYNGEAL PHASE 06/04/2018 Pharyngeal Phase Impaired Pharyngeal- Pudding Teaspoon -- Pharyngeal -- Pharyngeal- Pudding Cup -- Pharyngeal -- Pharyngeal- Honey Teaspoon -- Pharyngeal -- Pharyngeal- Honey Cup -- Pharyngeal -- Pharyngeal- Nectar Teaspoon Delayed swallow initiation-vallecula;Trace aspiration;Reduced airway/laryngeal closure Pharyngeal -- Pharyngeal- Nectar Cup (No Data) Pharyngeal -- Pharyngeal- Nectar Straw Delayed swallow initiation-pyriform sinuses;Reduced airway/laryngeal closure;Penetration/Aspiration during swallow;Penetration/Apiration after swallow;Trace aspiration Pharyngeal Material enters airway, passes BELOW cords and not ejected out despite cough attempt by patient;Material enters airway, passes BELOW cords then ejected out Pharyngeal- Thin Teaspoon Penetration/Aspiration before swallow;Reduced airway/laryngeal closure Pharyngeal Material does not enter airway;Material enters airway, passes BELOW cords and not ejected out despite cough attempt by patient Pharyngeal- Thin Cup -- Pharyngeal -- Pharyngeal- Thin Straw -- Pharyngeal -- Pharyngeal- Puree Delayed swallow initiation-vallecula Pharyngeal -- Pharyngeal- Mechanical Soft Delayed swallow initiation-vallecula Pharyngeal -- Pharyngeal- Regular -- Pharyngeal -- Pharyngeal- Multi-consistency -- Pharyngeal -- Pharyngeal- Pill Delayed swallow initiation-vallecula Pharyngeal -- Pharyngeal Comment --  CHL IP CERVICAL ESOPHAGEAL PHASE 06/04/2018 Cervical Esophageal Phase WFL Pudding Teaspoon -- Pudding Cup -- Honey Teaspoon -- Honey Cup -- Nectar Teaspoon -- Nectar Cup  -- Nectar Straw -- Thin Teaspoon -- Thin Cup -- Thin Straw -- Puree -- Mechanical Soft -- Regular -- Multi-consistency -- Pill -- Cervical Esophageal Comment -- Thank you, Genene Churn, CCC-SLP [phone] [place] 06/04/2018, 2:16 PM              Scheduled Meds: . acetylcysteine  4 mL Nebulization BID  . atorvastatin  40 mg Oral q1800  . divalproex  500 mg Oral BID  . enoxaparin (LOVENOX) injection  40 mg Subcutaneous Q24H  . Ferrous Fumarate  1 tablet Oral BID  . insulin aspart  0-5 Units Subcutaneous QHS  . insulin aspart  0-9 Units Subcutaneous TID WC  . ipratropium-albuterol  3 mL Nebulization Q6H WA  . levETIRAcetam  1,500 mg Oral BID  . linaclotide  145 mcg Oral Daily  . LORazepam  0.25 mg Oral QHS  . mouth rinse  15 mL Mouth Rinse BID  . metoprolol tartrate  50 mg Oral BID  . montelukast  10 mg Oral QHS  . oxybutynin  5 mg Oral BID  . topiramate  50 mg Oral BID  . venlafaxine XR  112.5 mg Oral Daily   Continuous Infusions:    LOS: 12 days  \ Kathie Dike, MD [facility] Pager [phone]  If 7PM-7AM, please contact night-coverage [email or web address] Password  [email or web address] 06/04/2018, 7:43 PM

## 2018-06-04 NOTE — Progress Notes (Signed)
Modified Barium Swallow Progress Note  Patient Details  Name: Tami Nolan MRN: [id number] Date of Birth: [birth year]  Today's Date: 06/04/2018  Modified Barium Swallow completed.  Full report located under Chart Review in the Imaging Section.  Brief recommendations include the following:  Clinical Impression  Pt presents with moderate oropharyngeal phase dysphagia characterized by lingual pumping, lack of rotary mastication, reduced lingual movement, premature spillage, delay in swallow trigger, and reduced laryngeal closure resulting in lack of swallow trigger when Pt presented with cup sips. Pt with improved swallow trigger when liquids presented via spoon. Trace aspiration of thins occurred before and during the swallow due to premature spillage and delay in swallow trigger. Aspiration was generally sensed, but delayed cough and was not removed from trachea. Pt aspirated straw sips (trace amount) of NTL due to premature spillage and reduced laryngeal closure. Barium tablet was swallowed whole in puree without incident. Recommend D1/puree with NTL presented via spoon presentation with pillows behind head for positioning. OK for po meds whole or crushed in puree. Pt will need 1:1 assist for all eating and drinking and full assist for oral care before and after meals. Pt is at risk for aspiration and dehydration given cognitive deficits and dysphagia. SLP will follow during acute stay and recommend f/u SLP services at SNF.    Swallow Evaluation Recommendations       SLP Diet Recommendations: Dysphagia 1 (Puree) solids;Nectar thick liquid(via spoon presentations)   Liquid Administration via: Spoon;No straw(liquids presented via spoon)   Medication Administration: Whole meds with puree   Supervision: Full assist for feeding;Full supervision/cueing for compensatory strategies   Compensations: Slow rate;Small sips/bites   Postural Changes: Remain semi-upright after after feeds/meals  (Comment);Seated upright at 90 degrees(position pillows behind head )   Oral Care Recommendations: Oral care before and after PO;Staff/trained caregiver to provide oral care   Other Recommendations: Order thickener from pharmacy;Clarify dietary restrictions;Prohibited food (jello, ice cream, thin soups);Remove water pitcher   Thank you,  Havery Moros, CCC-SLP [phone]   PORTER,DABNEY 06/04/2018,2:07 PM

## 2018-06-04 NOTE — Progress Notes (Addendum)
Palliative:  [name], Tami Nolan is resting quietly in bed.  Although she looks somewhat improved today she still appears to be chronically ill and frail.  She will make and keep eye contact, is able to make her basic needs known, but is unable to participate in decision-making.  Her Sister Tami Nolan, she says yes to everything.  Present today at bedside is sister Tami Nolan and her daughter Tami Nolan, brother Tami Nolan and his wife and daughter unnamed.  We talked in detail about Tami Nolan's acute and chronic health problems.  We review swallow study in detail.  We reviewed yesterday's chest x-ray in detail.  We also reviewed labs and the treatment plan in detail.  I share a diagram of the chronic illness pathway, what is normal and expected.  I attempted to elicit from family how Tami Nolan would want to be cared for, what is important for her.   We talked about how to make choices for loved ones including 1) keeping them at the center of decision-making 2) are we doing something for them or to them (can we change what is happening) 3) the person Tami Nolan was 10 years ago, how with that person tell them to care for Tami Nolan now.  I talked about WHEN Tami Nolan gets sick again.  We talked about PEG tube placement.  We talked about continued aspiration from oral secretions, body image disturbance, increased incidence of bedsores with tube feeding.  Tami Nolan brings up continued aspiration from tube feeding regurgitating if she is laid flat.  Tami Nolan asks about options of we do not place a PEG.  I share that we would continue with current pured diet and nectar thick liquids.  Speech therapy to continue at facility.  Tami Nolan states that she prefers to NOT place PEG tube.  We talked about healthcare power of attorney.  Tami Nolan has not named any specific person as her healthcare surrogate.  She only has 2 siblings Tami Nolan and Tami Nolan.  They shared decision making.  We talked about CODE STATUS.  I encouraged family to consider that if  modern medicine is not able to turn around her next illness, life support would not change things either.  We talked about the concept of "let nature take its course", and I give an example.  I share that it is not illegal, or unethical, but we know that some people have a moral problem with not doing everything.  Encourage family to listen to what is laid on her heart about caring for Tami Nolan.  They give no indication that they want to "allow a natural death" (DNR) at this time.  Conference with hospitalist related to goals of care discussion. Conference with speech therapy related to swallowing study.  100 minutes, extended time Tami Carmelasha Dove, NP Palliative Medicine Team Team Phone # [phone]  Greater than 50% of this time was spent counseling and coordinating care related to the above assessment and plan.

## 2018-06-05 DIAGNOSIS — J189 Pneumonia, unspecified organism: Secondary | ICD-10-CM

## 2018-06-05 LAB — CBC
HCT: 41.8 % (ref 36.0–46.0)
HEMOGLOBIN: 13.8 g/dL (ref 12.0–15.0)
MCH: 27.9 pg (ref 26.0–34.0)
MCHC: 33 g/dL (ref 30.0–36.0)
MCV: 84.4 fL (ref 80.0–100.0)
Platelets: 283 10*3/uL (ref 150–400)
RBC: 4.95 MIL/uL (ref 3.87–5.11)
RDW: 14.9 % (ref 11.5–15.5)
WBC: 7.3 10*3/uL (ref 4.0–10.5)
nRBC: 0 % (ref 0.0–0.2)

## 2018-06-05 LAB — BASIC METABOLIC PANEL
Anion gap: 6 (ref 5–15)
BUN: 16 mg/dL (ref 6–20)
CHLORIDE: 113 mmol/L — AB (ref 98–111)
CO2: 26 mmol/L (ref 22–32)
Calcium: 8.8 mg/dL — ABNORMAL LOW (ref 8.9–10.3)
Creatinine, Ser: 0.62 mg/dL (ref 0.44–1.00)
GFR calc Af Amer: >60 mL/min (ref >60)
GFR calc non Af Amer: >60 mL/min (ref >60)
Glucose, Bld: 117 mg/dL — ABNORMAL HIGH (ref 70–99)
Potassium: 3.2 mmol/L — ABNORMAL LOW (ref 3.5–5.1)
Sodium: 145 mmol/L (ref 135–145)

## 2018-06-05 LAB — GLUCOSE, CAPILLARY
Glucose-Capillary: 127 mg/dL — ABNORMAL HIGH (ref 70–99)
Glucose-Capillary: 165 mg/dL — ABNORMAL HIGH (ref 70–99)

## 2018-06-05 MED ORDER — FERROUS FUMARATE 324 (106 FE) MG PO TABS: 1.0000 | ORAL_TABLET | Freq: Two times a day (BID) | ORAL | 0 refills | Status: AC

## 2018-06-05 NOTE — Progress Notes (Signed)
Discussed with Dr. Sherryll Burger hospitalist attending.  He plans to send her back to the skilled care facility which I think is appropriate.  She is chronically aspirating but there is nothing that we can do about that.  I think she is at maximum hospital benefit.  I agree with his assessment that she will very likely need readmission.

## 2018-06-05 NOTE — Progress Notes (Signed)
Report called to [facility]. 

## 2018-06-05 NOTE — Clinical Social Work Note (Signed)
Confirmed with [facility] that they are ready to receive patient.  D/C summary sent via HUB.  Confirmed transfer plan with nursing.  CSW filled out transportation form.  CSW sign off.

## 2018-06-05 NOTE — Discharge Summary (Signed)
Physician Discharge Summary  Tami Nolan MVH:[id number] DOB: 14-May-1961 DOA: 05/23/2018  PCP: Rosita Fire, MD  Admit date: 05/23/2018  Discharge date: 06/05/2018  Admitted From:SNF  Disposition:  [facility] SNF  Recommendations for Outpatient Follow-up:  1. Follow up with PCP in 1-2 weeks 2. Continue on medications as prescribed. 3. Patient will likely need readmission in the near future due to ongoing aspiration risk  Home Health: None  Equipment/Devices: None  Discharge Condition: Stable  CODE STATUS: Full  Diet recommendation: Dysphagia 1 diet as tolerated/pureed  Brief/Interim Summary: Per HPI: 58 year old female with a history of bipolar disorder, hypertension, multiple sclerosis, who is a resident of a skilled nursing facility, admitted to the hospital with shortness of breath, hypoxia and fevers.  Found to have healthcare associated pneumonia and influenza A.  She has completed a course of Tamiflu as well as vancomycin and cefepime.  She was seen in consultation by pulmonology as well as palliative care and it is thought that patient will have recurrent bouts of aspiration on account of her worsening MS.  Family members have been told that she is not a candidate for very much and do not want any aggressive interventions to include feeding tube.  It is anticipated that she will become more malnourished over time and will have recurrent episodes of aspiration pneumonia which may require hospital admission.  Palliative care did discuss DNR and allowing natural death which brother and sister were not interested in.  She is currently stable for discharge.  Patient continues to have chronic hypoxemic respiratory failure and requires 4 L nasal cannula.  Discharge Diagnoses:  Principal Problem:   HCAP (healthcare-associated pneumonia) Active Problems:   Hypokalemia   History of multiple sclerosis   Seizure disorder (HCC)   SIRS (systemic inflammatory response syndrome)  (HCC)   Influenza A   Acute respiratory failure with hypoxia (HCC)   Goals of care, counseling/discussion   Palliative care by specialist   DNR (do not resuscitate) discussion  Principal discharge diagnosis: Acute hypoxemic respiratory failure with recurrent aspiration pneumonia and dysphagia.  Discharge Instructions  Discharge Instructions    Diet - low sodium heart healthy   Complete by:  As directed    Increase activity slowly   Complete by:  As directed      Allergies as of 06/05/2018   No Known Allergies     Medication List    STOP taking these medications   amLODipine 2.5 MG tablet Commonly known as:  NORVASC   cyclobenzaprine 10 MG tablet Commonly known as:  FLEXERIL   furosemide 20 MG tablet Commonly known as:  LASIX   simvastatin 40 MG tablet Commonly known as:  ZOCOR     TAKE these medications   aspirin 325 MG EC tablet Take 81 mg by mouth daily.   atorvastatin 40 MG tablet Commonly known as:  LIPITOR Take 40 mg by mouth daily.   divalproex 125 MG capsule Commonly known as:  DEPAKOTE SPRINKLE Take 500 mg by mouth 2 (two) times daily.   Ferrous Fumarate 324 (106 Fe) MG Tabs tablet Commonly known as:  HEMOCYTE - 106 mg FE Take 1 tablet (106 mg of iron total) by mouth 2 (two) times daily.   ipratropium-albuterol 0.5-2.5 (3) MG/3ML Soln Commonly known as:  DUONEB Take 3 mLs by nebulization every 6 (six) hours as needed.   levETIRAcetam 100 MG/ML solution Commonly known as:  KEPPRA Take 1,500 mg by mouth 2 (two) times daily. What changed:  Another medication  with the same name was removed. Continue taking this medication, and follow the directions you see here.   linaclotide 145 MCG Caps capsule Commonly known as:  LINZESS Take 1 capsule (145 mcg total) by mouth daily.   loratadine 10 MG tablet Commonly known as:  CLARITIN Take 1 tablet (10 mg total) by mouth daily.   LORazepam 2 MG tablet Commonly known as:  ATIVAN Take 0.25 mg by mouth at  bedtime.   metoprolol tartrate 25 MG tablet Commonly known as:  LOPRESSOR Take 50 mg by mouth 2 (two) times daily.   montelukast 10 MG tablet Commonly known as:  SINGULAIR Take 1 tablet (10 mg total) by mouth at bedtime.   multivitamin with minerals Tabs tablet Take 1 tablet by mouth daily.   oxybutynin 5 MG tablet Commonly known as:  DITROPAN Take 5 mg by mouth 2 (two) times daily.   peg 3350 powder 100 g Solr Commonly known as:  MOVIPREP Take 1 kit (200 g total) by mouth as directed.   sodium phosphate 7-19 GM/118ML Enem Place 133 mLs (1 enema total) rectally once.   TECFIDERA 240 MG Cpdr Generic drug:  Dimethyl Fumarate Take by mouth 2 (two) times daily.   thiamine 100 MG tablet Take 1 tablet (100 mg total) by mouth daily.   topiramate 25 MG tablet Commonly known as:  TOPAMAX Take 50 mg by mouth 2 (two) times daily.   venlafaxine XR 150 MG 24 hr capsule Commonly known as:  EFFEXOR-XR Take 1 capsule (150 mg total) by mouth daily. What changed:  how much to take      Follow-up Information    Rosita Fire, MD Follow up in 1 week(s).   Specialty:  Internal Medicine Contact information: [address] [place] [address] [phone]          No Known Allergies  Consultations:  Pulmonology  Palliative care   Procedures/Studies: Dg Chest 2 View  Result Date: 05/23/2018 CLINICAL DATA:  Worsening shortness of breath. EXAM: CHEST - 2 VIEW COMPARISON:  Chest radiograph 12/31/2016 FINDINGS: Monitoring leads overlie the patient. Cardiac contours largely obscured. Diffuse bilateral patchy consolidation within the mid lower lungs bilaterally. Moderate layering bilateral pleural effusions. IMPRESSION: Bilateral mid and lower lung patchy consolidation with associated effusions. Findings may represent pulmonary edema or multifocal infection. Electronically Signed   By: Lovey Newcomer M.D.   On: 05/23/2018 20:43   Dg Chest Port 1 View  Result Date:  06/03/2018 CLINICAL DATA:  Pneumonia; history of CHF, current smoker, multiple scleroses, diabetes. EXAM: PORTABLE CHEST 1 VIEW COMPARISON:  Portable chest x-ray of May 30, 2018 FINDINGS: The lungs remain mildly hypoinflated. Diffuse interstitial infiltrates persist. The hemidiaphragms remain obscured. The cardiac silhouette is nearly totally obscured. The central pulmonary vascularity is prominent. There is calcification in the wall of the aortic arch. The bony thorax exhibits no acute abnormality. IMPRESSION: Stable to slightly worsened appearance of both lungs consistent with CHF. Underlying bilateral pneumonia is suspected as well. Stable small to moderate-sized bilateral pleural effusions. Thoracic aortic atherosclerosis. Electronically Signed   By: David  Martinique M.D.   On: 06/03/2018 07:41   Dg Chest Port 1 View  Result Date: 05/30/2018 CLINICAL DATA:  Fever with shortness of breath EXAM: PORTABLE CHEST 1 VIEW COMPARISON:  May 27, 2018 FINDINGS: There is cardiomegaly with pulmonary venous hypertension. There is widespread airspace consolidation in both mid and lower lung zones. There are pleural effusions bilaterally. There is a degree of interstitial pulmonary edema. IMPRESSION:  Persistent pulmonary vascular congestion with pleural effusions bilaterally. There is interstitial pulmonary edema. Airspace consolidation in both mid and lower lung zones may represent alveolar edema; pneumonia cannot be excluded in the lower lung zones, however. There may be both alveolar edema and pneumonia in the bases. The overall appearance does indicate a degree of congestive heart failure. Electronically Signed   By: Lowella Grip III M.D.   On: 05/30/2018 19:47   Dg Chest Port 1 View  Result Date: 05/27/2018 CLINICAL DATA:  Shortness of breath.  Pneumonia. EXAM: PORTABLE CHEST 1 VIEW COMPARISON:  05/23/2018. FINDINGS: Cardiomegaly with diffuse bilateral pulmonary infiltrates/edema, progressed from  prior exam. Prominent bibasilar atelectasis again noted. Bilateral pleural effusions are also again most likely present. No pneumothorax. No acute bony abnormality. IMPRESSION: Cardiomegaly with diffuse bilateral pulmonary infiltrates/edema, progressed from prior exam. Prominent bibasilar atelectasis again noted. Bilateral pleural effusions are also again most likely present. Electronically Signed   By: Marcello Moores  Register   On: 05/27/2018 06:54   Dg Swallowing Func-speech Pathology  Result Date: 06/04/2018 Objective Swallowing Evaluation: Type of Study: MBS-Modified Barium Swallow Study  Patient Details Name: Tami Nolan MRN: [id number] Date of Birth: 12-Nov-1960 Today's Date: 06/04/2018 Time: SLP Start Time (ACUTE ONLY): 1310 -SLP Stop Time (ACUTE ONLY): 1336 SLP Time Calculation (min) (ACUTE ONLY): 26 min Past Medical History: Past Medical History: Diagnosis Date . Anxiety  . Bipolar disorder ([place])  . CHF (congestive heart failure) ([place])  . Colon polyps 1997 . Diabetes mellitus without complication ([place])  . ETOH abuse  . Genital warts  . Hepatitis   ETOH related  . History of electroencephalogram 08/2012  normal . Hx of abnormal cervical Pap smear 1997 . Hypertension  . Hypokalemia  . Major depressive disorder  . Multiple sclerosis ([place])  . Rhabdomyolysis  . Stroke ([place])  . Vascular dementia [facility])  Past Surgical History: Past Surgical History: Procedure Laterality Date . bilatetral tubal ligation   HPI: 58 year old female with a history of bipolar disorder, hypertension, multiple sclerosis, who is a resident of a skilled nursing facility, admitted to the hospital with shortness of breath, hypoxia and fevers.  Found to have healthcare associated pneumonia and influenza A.  Currently on IV antibiotics and Tamiflu.  She also has significant hypoxia and is on supplemental oxygen. BSE/MBS requested. Pt reportedly on puree and NTL at SNF.  Subjective: "Oh!" Assessment / Plan / Recommendation CHL IP CLINICAL  IMPRESSIONS 06/04/2018 Clinical Impression Pt presents with moderate oropharyngeal phase dysphagia characterized by lingual pumping, lack of rotary mastication, reduced lingual movement, premature spillage, delay in swallow trigger, and reduced laryngeal closure resulting in lack of swallow trigger when Pt presented with cup sips. Pt with improved swallow trigger when liquids presented via spoon. Trace aspiration of thins occurred before and during the swallow due to premature spillage and delay in swallow trigger. Aspiration was generally sensed, but delayed cough and was not removed from trachea. Pt aspirated straw sips (trace amount) of NTL due to premature spillage and reduced laryngeal closure. Barium tablet was swallowed whole in puree without incident. Recommend D1/puree with NTL presented via spoon presentation with pillows behind head for positioning. OK for po meds whole or crushed in puree. Pt will need 1:1 assist for all eating and drinking and full assist for oral care before and after meals. Pt is at risk for aspiration and dehydration given cognitive deficits and dysphagia. SLP will follow during acute stay and recommend f/u SLP services at SNF.  SLP Visit Diagnosis  Dysphagia, oropharyngeal phase (R13.12) Attention and concentration deficit following -- Frontal lobe and executive function deficit following -- Impact on safety and function Moderate aspiration risk;Risk for inadequate nutrition/hydration   CHL IP TREATMENT RECOMMENDATION 06/04/2018 Treatment Recommendations Therapy as outlined in treatment plan below   Prognosis 06/04/2018 Prognosis for Safe Diet Advancement Guarded Barriers to Reach Goals Severity of deficits;Cognitive deficits Barriers/Prognosis Comment -- CHL IP DIET RECOMMENDATION 06/04/2018 SLP Diet Recommendations Dysphagia 1 (Puree) solids;Nectar thick liquid Liquid Administration via Spoon;No straw Medication Administration Whole meds with puree Compensations Slow rate;Small  sips/bites Postural Changes Remain semi-upright after after feeds/meals (Comment);Seated upright at 90 degrees   CHL IP OTHER RECOMMENDATIONS 06/04/2018 Recommended Consults -- Oral Care Recommendations Oral care before and after PO;Staff/trained caregiver to provide oral care Other Recommendations Order thickener from pharmacy;Clarify dietary restrictions;Prohibited food (jello, ice cream, thin soups);Remove water pitcher   CHL IP FOLLOW UP RECOMMENDATIONS 06/04/2018 Follow up Recommendations Skilled Nursing facility   [facility] IP FREQUENCY AND DURATION 06/04/2018 Speech Therapy Frequency (ACUTE ONLY) min 2x/week Treatment Duration 1 week      CHL IP ORAL PHASE 06/04/2018 Oral Phase Impaired Oral - Pudding Teaspoon -- Oral - Pudding Cup -- Oral - Honey Teaspoon -- Oral - Honey Cup -- Oral - Nectar Teaspoon Weak lingual manipulation;Lingual pumping Oral - Nectar Cup Weak lingual manipulation;Lingual pumping;Premature spillage Oral - Nectar Straw -- Oral - Thin Teaspoon Premature spillage Oral - Thin Cup -- Oral - Thin Straw -- Oral - Puree Lingual pumping Oral - Mech Soft Impaired mastication;Lingual pumping Oral - Regular -- Oral - Multi-Consistency -- Oral - Pill Delayed oral transit Oral Phase - Comment --  CHL IP PHARYNGEAL PHASE 06/04/2018 Pharyngeal Phase Impaired Pharyngeal- Pudding Teaspoon -- Pharyngeal -- Pharyngeal- Pudding Cup -- Pharyngeal -- Pharyngeal- Honey Teaspoon -- Pharyngeal -- Pharyngeal- Honey Cup -- Pharyngeal -- Pharyngeal- Nectar Teaspoon Delayed swallow initiation-vallecula;Trace aspiration;Reduced airway/laryngeal closure Pharyngeal -- Pharyngeal- Nectar Cup (No Data) Pharyngeal -- Pharyngeal- Nectar Straw Delayed swallow initiation-pyriform sinuses;Reduced airway/laryngeal closure;Penetration/Aspiration during swallow;Penetration/Apiration after swallow;Trace aspiration Pharyngeal Material enters airway, passes BELOW cords and not ejected out despite cough attempt by patient;Material enters  airway, passes BELOW cords then ejected out Pharyngeal- Thin Teaspoon Penetration/Aspiration before swallow;Reduced airway/laryngeal closure Pharyngeal Material does not enter airway;Material enters airway, passes BELOW cords and not ejected out despite cough attempt by patient Pharyngeal- Thin Cup -- Pharyngeal -- Pharyngeal- Thin Straw -- Pharyngeal -- Pharyngeal- Puree Delayed swallow initiation-vallecula Pharyngeal -- Pharyngeal- Mechanical Soft Delayed swallow initiation-vallecula Pharyngeal -- Pharyngeal- Regular -- Pharyngeal -- Pharyngeal- Multi-consistency -- Pharyngeal -- Pharyngeal- Pill Delayed swallow initiation-vallecula Pharyngeal -- Pharyngeal Comment --  CHL IP CERVICAL ESOPHAGEAL PHASE 06/04/2018 Cervical Esophageal Phase WFL Pudding Teaspoon -- Pudding Cup -- Honey Teaspoon -- Honey Cup -- Nectar Teaspoon -- Nectar Cup -- Nectar Straw -- Thin Teaspoon -- Thin Cup -- Thin Straw -- Puree -- Mechanical Soft -- Regular -- Multi-consistency -- Pill -- Cervical Esophageal Comment -- Thank you, Genene Churn, [place] PORTER,DABNEY 06/04/2018, 2:16 PM                Discharge Exam: Vitals:   06/05/18 0445 06/05/18 0832  BP: 120/83   Pulse: 72   Resp: 14   Temp: 97.6 F (36.4 C)   SpO2: 92% 92%   Vitals:   06/04/18 1914 06/04/18 2210 06/05/18 0445 06/05/18 0832  BP:  107/74 120/83   Pulse:  69 72   Resp:  20 14   Temp:  98.6 F (37 C) 97.6  F (36.4 C)   TempSrc:  Oral Oral   SpO2: 93% 95% 92% 92%  Weight:      Height:        General: Pt is alert, awake, not in acute distress Cardiovascular: RRR, S1/S2 +, no rubs, no gallops Respiratory: CTA bilaterally, no wheezing, no rhonchi Abdominal: Soft, NT, ND, bowel sounds + Extremities: no edema, no cyanosis    The results of significant diagnostics from this hospitalization (including imaging, microbiology, ancillary and laboratory) are listed below for reference.     Microbiology: No results found for this or  any previous visit (from the past 240 hour(s)).   Labs: BNP (last 3 results) Recent Labs    05/23/18 1953 05/30/18 2041  BNP 68.0 85.8   Basic Metabolic Panel: Recent Labs  Lab 05/30/18 0527 05/31/18 0453 06/02/18 0523 06/03/18 0504 06/05/18 0601  NA 144 145 147* 145 145  K 3.3* 3.7 4.0 3.2* 3.2*  CL 118* 115* 118* 117* 113*  CO2 21* 22 24 20* 26  GLUCOSE 119* 120* 111* 126* 117*  BUN '14 12 15 12 16  '$ CREATININE 0.39* 0.40* 0.46 0.39* 0.62  CALCIUM 8.6* 8.5* 8.9 8.9 8.8*   Liver Function Tests: Recent Labs  Lab 05/30/18 0527  AST 21  ALT 19  ALKPHOS 45  BILITOT 0.5  PROT 6.8  ALBUMIN 2.6*   No results for input(s): LIPASE, AMYLASE in the last 168 hours. No results for input(s): AMMONIA in the last 168 hours. CBC: Recent Labs  Lab 05/30/18 0527 06/02/18 0523 06/05/18 0601  WBC 7.4 9.3 7.3  NEUTROABS 3.6  --   --   HGB 13.2 14.0 13.8  HCT 39.6 42.7 41.8  MCV 82.0 85.7 84.4  PLT 302 330 283   Cardiac Enzymes: No results for input(s): CKTOTAL, CKMB, CKMBINDEX, TROPONINI in the last 168 hours. BNP: Invalid input(s): POCBNP CBG: Recent Labs  Lab 06/04/18 0749 06/04/18 1113 06/04/18 1638 06/04/18 2207 06/05/18 0743  GLUCAP 109* 165* 137* 128* 127*   D-Dimer No results for input(s): DDIMER in the last 72 hours. Hgb A1c No results for input(s): HGBA1C in the last 72 hours. Lipid Profile No results for input(s): CHOL, HDL, LDLCALC, TRIG, CHOLHDL, LDLDIRECT in the last 72 hours. Thyroid function studies No results for input(s): TSH, T4TOTAL, T3FREE, THYROIDAB in the last 72 hours.  Invalid input(s): FREET3 Anemia work up No results for input(s): VITAMINB12, FOLATE, FERRITIN, TIBC, IRON, RETICCTPCT in the last 72 hours. Urinalysis    Component Value Date/Time   COLORURINE YELLOW 08/23/2014 0830   APPEARANCEUR HAZY (A) 08/23/2014 0830   LABSPEC 1.025 08/23/2014 0830   PHURINE 6.0 08/23/2014 0830   GLUCOSEU NEGATIVE 08/23/2014 0830   HGBUR TRACE  (A) 08/23/2014 0830   BILIRUBINUR NEGATIVE 08/23/2014 0830   KETONESUR NEGATIVE 08/23/2014 0830   PROTEINUR NEGATIVE 08/23/2014 0830   UROBILINOGEN 0.2 08/23/2014 0830   NITRITE NEGATIVE 08/23/2014 0830   LEUKOCYTESUR NEGATIVE 08/23/2014 0830   Sepsis Labs Invalid input(s): PROCALCITONIN,  WBC,  LACTICIDVEN Microbiology No results found for this or any previous visit (from the past 240 hour(s)).   Time coordinating discharge: 35 minutes  SIGNED:   Rodena Goldmann, DO [facility] 06/05/2018, 10:50 AM Pager [phone]  If 7PM-7AM, please contact night-coverage [email or web address] Password [email or web address]

## 2018-06-05 NOTE — Care Management Important Message (Signed)
Important Message  Patient Details  Name: DEJANEE MCNORTON MRN: [id number] Date of Birth: [birth year]   Medicare Important Message Given:  Yes    Dell Hurtubise, Chrystine Oiler, RN 06/05/2018, 12:41 PM

## 2018-08-04 DEATH — deceased
